# Patient Record
Sex: Female | Born: 1960 | Race: Black or African American | Hispanic: No | Marital: Single | Smoking: Former smoker
Health system: Southern US, Community
[De-identification: ages and names within clinical notes are randomized; demographics above are authoritative.]

## PROBLEM LIST (undated history)

## (undated) DIAGNOSIS — I219 Acute myocardial infarction, unspecified: Secondary | ICD-10-CM

## (undated) DIAGNOSIS — I2510 Atherosclerotic heart disease of native coronary artery without angina pectoris: Secondary | ICD-10-CM

## (undated) DIAGNOSIS — E785 Hyperlipidemia, unspecified: Secondary | ICD-10-CM

## (undated) DIAGNOSIS — I10 Essential (primary) hypertension: Secondary | ICD-10-CM

## (undated) DIAGNOSIS — M1990 Unspecified osteoarthritis, unspecified site: Secondary | ICD-10-CM

## (undated) DIAGNOSIS — I639 Cerebral infarction, unspecified: Secondary | ICD-10-CM

## (undated) DIAGNOSIS — I255 Ischemic cardiomyopathy: Secondary | ICD-10-CM

## (undated) HISTORY — DX: Unspecified osteoarthritis, unspecified site: M19.90

## (undated) HISTORY — PX: CORONARY ANGIOPLASTY: SHX604

## (undated) HISTORY — DX: Ischemic cardiomyopathy: I25.5

## (undated) HISTORY — PX: TONSILLECTOMY: SUR1361

## (undated) HISTORY — DX: Cerebral infarction, unspecified: I63.9

## (undated) HISTORY — DX: Essential (primary) hypertension: I10

## (undated) HISTORY — DX: Atherosclerotic heart disease of native coronary artery without angina pectoris: I25.10

## (undated) HISTORY — PX: CHOLECYSTECTOMY: SHX55

## (undated) HISTORY — PX: TONSILLECTOMY: SHX5217

## (undated) HISTORY — DX: Hyperlipidemia, unspecified: E78.5

---

## 1999-12-06 ENCOUNTER — Other Ambulatory Visit: Admission: RE | Admit: 1999-12-06 | Discharge: 1999-12-06 | Payer: Self-pay | Admitting: Obstetrics and Gynecology

## 2001-01-01 ENCOUNTER — Other Ambulatory Visit: Admission: RE | Admit: 2001-01-01 | Discharge: 2001-01-01 | Payer: Self-pay | Admitting: Obstetrics and Gynecology

## 2004-04-25 ENCOUNTER — Encounter: Admission: RE | Admit: 2004-04-25 | Discharge: 2004-04-25 | Payer: Self-pay | Admitting: Internal Medicine

## 2004-05-10 ENCOUNTER — Observation Stay (HOSPITAL_COMMUNITY): Admission: RE | Admit: 2004-05-10 | Discharge: 2004-05-11 | Payer: Self-pay | Admitting: General Surgery

## 2004-05-11 ENCOUNTER — Encounter (INDEPENDENT_AMBULATORY_CARE_PROVIDER_SITE_OTHER): Payer: Self-pay | Admitting: Specialist

## 2004-06-17 ENCOUNTER — Ambulatory Visit: Payer: Self-pay | Admitting: Endocrinology

## 2004-07-04 ENCOUNTER — Ambulatory Visit: Payer: Self-pay | Admitting: Endocrinology

## 2005-01-13 ENCOUNTER — Ambulatory Visit: Payer: Self-pay | Admitting: Family Medicine

## 2005-06-14 ENCOUNTER — Ambulatory Visit: Payer: Self-pay | Admitting: Family Medicine

## 2005-09-19 ENCOUNTER — Ambulatory Visit: Payer: Self-pay | Admitting: Family Medicine

## 2006-05-03 ENCOUNTER — Inpatient Hospital Stay (HOSPITAL_COMMUNITY): Admission: EM | Admit: 2006-05-03 | Discharge: 2006-05-05 | Payer: Self-pay

## 2006-05-03 ENCOUNTER — Ambulatory Visit: Payer: Self-pay | Admitting: Cardiology

## 2006-05-24 ENCOUNTER — Encounter (HOSPITAL_COMMUNITY): Admission: RE | Admit: 2006-05-24 | Discharge: 2006-08-22 | Payer: Self-pay | Admitting: Cardiology

## 2006-05-25 ENCOUNTER — Ambulatory Visit: Payer: Self-pay | Admitting: Cardiology

## 2006-06-07 ENCOUNTER — Encounter: Payer: Self-pay | Admitting: Cardiology

## 2006-06-07 ENCOUNTER — Ambulatory Visit: Payer: Self-pay

## 2006-07-13 ENCOUNTER — Ambulatory Visit: Payer: Self-pay | Admitting: Cardiology

## 2006-07-27 ENCOUNTER — Ambulatory Visit: Payer: Self-pay | Admitting: Cardiology

## 2006-08-23 ENCOUNTER — Encounter (HOSPITAL_COMMUNITY): Admission: RE | Admit: 2006-08-23 | Discharge: 2006-11-21 | Payer: Self-pay | Admitting: Cardiology

## 2006-11-30 ENCOUNTER — Ambulatory Visit: Payer: Self-pay | Admitting: Cardiology

## 2007-03-01 ENCOUNTER — Ambulatory Visit: Payer: Self-pay | Admitting: Cardiology

## 2007-04-23 DIAGNOSIS — I10 Essential (primary) hypertension: Secondary | ICD-10-CM | POA: Insufficient documentation

## 2007-04-23 DIAGNOSIS — E1169 Type 2 diabetes mellitus with other specified complication: Secondary | ICD-10-CM | POA: Insufficient documentation

## 2007-04-23 DIAGNOSIS — E119 Type 2 diabetes mellitus without complications: Secondary | ICD-10-CM | POA: Insufficient documentation

## 2007-05-09 ENCOUNTER — Ambulatory Visit: Payer: Self-pay | Admitting: Cardiology

## 2007-09-30 ENCOUNTER — Ambulatory Visit: Payer: Self-pay | Admitting: Cardiology

## 2007-11-08 ENCOUNTER — Ambulatory Visit: Admission: RE | Admit: 2007-11-08 | Discharge: 2007-11-08 | Payer: Self-pay | Admitting: Internal Medicine

## 2007-11-08 ENCOUNTER — Encounter: Payer: Self-pay | Admitting: Internal Medicine

## 2007-12-03 ENCOUNTER — Ambulatory Visit: Payer: Self-pay | Admitting: Cardiology

## 2007-12-03 LAB — CONVERTED CEMR LAB
BUN: 12 mg/dL (ref 6–23)
CO2: 25 meq/L (ref 19–32)
Calcium: 9 mg/dL (ref 8.4–10.5)
Chloride: 106 meq/L (ref 96–112)
Creatinine, Ser: 0.8 mg/dL (ref 0.4–1.2)
GFR calc Af Amer: 98 mL/min
GFR calc non Af Amer: 81 mL/min
Glucose, Bld: 186 mg/dL — ABNORMAL HIGH (ref 70–99)
Potassium: 4 meq/L (ref 3.5–5.1)
Sodium: 138 meq/L (ref 135–145)

## 2008-02-06 ENCOUNTER — Ambulatory Visit: Payer: Self-pay | Admitting: Cardiology

## 2008-02-24 ENCOUNTER — Ambulatory Visit: Payer: Self-pay | Admitting: Cardiology

## 2008-02-24 LAB — CONVERTED CEMR LAB
ALT: 12 units/L (ref 0–35)
AST: 16 units/L (ref 0–37)
Albumin: 3.2 g/dL — ABNORMAL LOW (ref 3.5–5.2)
Alkaline Phosphatase: 74 units/L (ref 39–117)
BUN: 12 mg/dL (ref 6–23)
Bilirubin, Direct: 0.1 mg/dL (ref 0.0–0.3)
CO2: 25 meq/L (ref 19–32)
Calcium: 9.1 mg/dL (ref 8.4–10.5)
Chloride: 104 meq/L (ref 96–112)
Creatinine, Ser: 0.8 mg/dL (ref 0.4–1.2)
GFR calc Af Amer: 98 mL/min
GFR calc non Af Amer: 81 mL/min
Glucose, Bld: 164 mg/dL — ABNORMAL HIGH (ref 70–99)
Hgb A1c MFr Bld: 9.4 % — ABNORMAL HIGH (ref 4.6–6.0)
Potassium: 4.2 meq/L (ref 3.5–5.1)
Sed Rate: 43 mm/hr — ABNORMAL HIGH (ref 0–22)
Sodium: 137 meq/L (ref 135–145)
Total Bilirubin: 0.5 mg/dL (ref 0.3–1.2)
Total Protein: 6.8 g/dL (ref 6.0–8.3)

## 2008-03-16 ENCOUNTER — Ambulatory Visit: Payer: Self-pay | Admitting: Cardiovascular Disease

## 2008-03-16 LAB — CONVERTED CEMR LAB
Cholesterol: 154 mg/dL
HDL: 36.3 mg/dL — ABNORMAL LOW
LDL Cholesterol: 102 mg/dL — ABNORMAL HIGH
Total CHOL/HDL Ratio: 4.2
Triglycerides: 81 mg/dL
VLDL: 16 mg/dL

## 2008-05-19 ENCOUNTER — Encounter: Admission: RE | Admit: 2008-05-19 | Discharge: 2008-05-19 | Payer: Self-pay | Admitting: Orthopedic Surgery

## 2008-07-10 ENCOUNTER — Ambulatory Visit: Payer: Self-pay | Admitting: Cardiovascular Disease

## 2008-07-10 LAB — CONVERTED CEMR LAB
ALT: 12 units/L (ref 0–35)
AST: 13 units/L (ref 0–37)
Albumin: 2.9 g/dL — ABNORMAL LOW (ref 3.5–5.2)
Alkaline Phosphatase: 77 units/L (ref 39–117)
Bilirubin, Direct: 0.1 mg/dL (ref 0.0–0.3)
Cholesterol: 114 mg/dL (ref 0–200)
HDL: 28.9 mg/dL — ABNORMAL LOW (ref >39.0)
LDL Cholesterol: 75 mg/dL (ref 0–99)
Total Bilirubin: 0.3 mg/dL (ref 0.3–1.2)
Total CHOL/HDL Ratio: 3.9
Total Protein: 7 g/dL (ref 6.0–8.3)
Triglycerides: 50 mg/dL (ref 0–149)
VLDL: 10 mg/dL (ref 0–40)

## 2008-07-13 ENCOUNTER — Ambulatory Visit: Payer: Self-pay | Admitting: Cardiovascular Disease

## 2009-01-14 ENCOUNTER — Inpatient Hospital Stay (HOSPITAL_COMMUNITY): Admission: RE | Admit: 2009-01-14 | Discharge: 2009-01-16 | Payer: Self-pay | Admitting: Internal Medicine

## 2009-01-14 ENCOUNTER — Ambulatory Visit: Payer: Self-pay | Admitting: Cardiovascular Disease

## 2009-01-14 ENCOUNTER — Encounter: Payer: Self-pay | Admitting: Cardiovascular Disease

## 2009-01-15 ENCOUNTER — Encounter: Payer: Self-pay | Admitting: Cardiovascular Disease

## 2009-01-27 ENCOUNTER — Encounter: Payer: Self-pay | Admitting: Cardiovascular Disease

## 2009-02-10 ENCOUNTER — Ambulatory Visit: Payer: Self-pay

## 2009-02-10 ENCOUNTER — Ambulatory Visit: Payer: Self-pay | Admitting: Cardiovascular Disease

## 2009-02-10 ENCOUNTER — Encounter: Payer: Self-pay | Admitting: Cardiovascular Disease

## 2009-02-10 DIAGNOSIS — I2589 Other forms of chronic ischemic heart disease: Secondary | ICD-10-CM | POA: Insufficient documentation

## 2009-02-10 DIAGNOSIS — E785 Hyperlipidemia, unspecified: Secondary | ICD-10-CM | POA: Insufficient documentation

## 2009-02-10 DIAGNOSIS — I2510 Atherosclerotic heart disease of native coronary artery without angina pectoris: Secondary | ICD-10-CM | POA: Insufficient documentation

## 2009-02-10 DIAGNOSIS — M129 Arthropathy, unspecified: Secondary | ICD-10-CM | POA: Insufficient documentation

## 2009-02-16 ENCOUNTER — Encounter (HOSPITAL_COMMUNITY): Admission: RE | Admit: 2009-02-16 | Discharge: 2009-05-06 | Payer: Self-pay | Admitting: Cardiovascular Disease

## 2009-04-19 ENCOUNTER — Encounter: Admission: RE | Admit: 2009-04-19 | Discharge: 2009-04-19 | Payer: Self-pay | Admitting: Internal Medicine

## 2009-04-20 ENCOUNTER — Encounter: Admission: RE | Admit: 2009-04-20 | Discharge: 2009-04-20 | Payer: Self-pay | Admitting: Orthopedic Surgery

## 2009-04-20 ENCOUNTER — Encounter: Payer: Self-pay | Admitting: Orthopedic Surgery

## 2009-04-21 ENCOUNTER — Telehealth: Payer: Self-pay | Admitting: Cardiovascular Disease

## 2009-04-23 ENCOUNTER — Telehealth: Payer: Self-pay | Admitting: Cardiovascular Disease

## 2009-04-26 ENCOUNTER — Ambulatory Visit: Payer: Self-pay | Admitting: Cardiovascular Disease

## 2009-04-28 ENCOUNTER — Encounter: Payer: Self-pay | Admitting: Cardiovascular Disease

## 2009-06-07 ENCOUNTER — Encounter: Payer: Self-pay | Admitting: Cardiovascular Disease

## 2009-06-07 ENCOUNTER — Encounter (INDEPENDENT_AMBULATORY_CARE_PROVIDER_SITE_OTHER): Payer: Self-pay | Admitting: *Deleted

## 2009-07-08 ENCOUNTER — Encounter: Payer: Self-pay | Admitting: Cardiovascular Disease

## 2009-07-09 ENCOUNTER — Encounter (INDEPENDENT_AMBULATORY_CARE_PROVIDER_SITE_OTHER): Payer: Self-pay | Admitting: *Deleted

## 2009-09-11 ENCOUNTER — Emergency Department (HOSPITAL_COMMUNITY): Admission: EM | Admit: 2009-09-11 | Discharge: 2009-09-11 | Payer: Self-pay | Admitting: Emergency Medicine

## 2009-09-17 ENCOUNTER — Ambulatory Visit: Payer: Self-pay | Admitting: Cardiovascular Disease

## 2010-09-08 NOTE — Letter (Signed)
Summary: Appointment - Missed  [facility], Main Office  [address]   [place], [place] [address]   Phone: [phone]  Fax: [phone]     July 09, 2009 MRN: [id number]   [name] [address] [place], [place]  [address]   Dear Ms. SHARPLESS,  Our records indicate you missed your appointment on June 08, 2009 with Dr. Clifton James. It is very important that we reach you to reschedule this appointment. We look forward to participating in your health care needs. Please contact us at the number listed above at your earliest convenience to reschedule this appointment.     Sincerely, Glass blower/designer

## 2010-09-08 NOTE — Assessment & Plan Note (Signed)
Summary: rov per pt call/lg  Medications Added * GLUCOCONTROL SUPPLEMENT 2 caps two times a day        Visit Type:  rov Primary Provider:  [name]  CC:  pt states she had palpatations the last 2 weekends in a row and went ED and had elevated bp..otherwise no other complaints today.  History of Present Illness: [name] is a pleasant 50 year old African American female with past medical history significant for hyperlipidemia, diabetes mellitus, and coronary artery disease status post placement of a Taxus drug-eluting stent in the proximal LAD in September 2007.  She was admitted to [facility] 01/14/09 with an acute anterior STEMI and was found to have very late thrombosis of the Taxus stent in the proximal LAD. Dr. Excell Seltzer treated her emergently with balloon angioplasty, no further stents were placed.  She did well following the cath and was discharged to home on 01/16/09. She unfortunately ruptured her achilles in September and there were discussions about possible repair. I saw her on April 26, 2009 and spoke to Dr. Lovie Chol. Her surgery was delayed at that time and a cast was placed. We were hoping to avoid stopping her Effient if at all possible.   She is here today for follow up.  She tells me that she has felt her heart pounding over the last two weekends. This happended after drinking wine. There was no irregularity of her heart rhytym. It was not going too fast. She was seen in the ED and told that her blood pressure was high (166/67). She denies any CP, near syncope, syncope, orthopnea, PND or lower ext edema.  She saw Dr. Lestine Box last Monday and she is going through therapy. She has some mobility of her left ankle but it is still painful. There are currently no plans for surgery.   Current Medications (verified): 1)  Diovan 80 Mg Tabs (Valsartan) .Marland Kitchen.. 1 Tab Once Daily 2)  Aspirin 81 Mg Tbec (Aspirin) .... Take One Tablet By Mouth Daily 3)  Effient 10 Mg Tabs (Prasugrel  Hcl) .... One Tablet By Mouth Once Daily. 4)  Lovaza 1 Gm Caps (Omega-3-Acid Ethyl Esters) .Marland Kitchen.. 1 Cap Once Daily 5)  Methocarbamol 500 Mg Tabs (Methocarbamol) .... As Needed 6)  Carvedilol 6.25 Mg Tabs (Carvedilol) .... One Tablet By Mouth Twice Daily 7)  Glyburide-Metformin 5-500 Mg Tabs (Glyburide-Metformin) .... 2 Tabs Two Times A Day 8)  Humalog 100 Unit/ml Soln (Insulin Lispro (Human)) .... Sliding Scale 9)  Glucocontrol Supplement .... 2 Caps Two Times A Day  Allergies: 1)  ! * Byetta  Past History:  Past Medical History: Reviewed history from 02/10/2009 and no changes required. CAD, UNSPECIFIED SITE (ICD-414.00)-native vessel. STEMI 01/14/09 with occlusion of stent in proximal LAD Ischemic cardiomyopathy HYPERLIPIDEMIA-MIXED (ICD-272.4) HYPERTENSION (ICD-401.9) DIABETES MELLITUS, TYPE II (ICD-250.00) ARTHRITIS (ICD-716.90) FAMILY HISTORY DIABETES 1ST DEGREE RELATIVE (ICD-V18.0) FAMILY HISTORY OF COLON CA 1ST DEGREE RELATIVE <60 (ICD-V16.0)    Social History: Reviewed history from 02/10/2009 and no changes required. Occupation:Teacher Single Former Smoker Alcohol use-no No illicit drug use  Review of Systems       The patient complains of palpitations.  The patient denies fatigue, malaise, fever, weight gain/loss, vision loss, decreased hearing, hoarseness, chest pain, shortness of breath, prolonged cough, wheezing, sleep apnea, coughing up blood, abdominal pain, blood in stool, nausea, vomiting, diarrhea, heartburn, incontinence, blood in urine, muscle weakness, joint pain, leg swelling, rash, skin lesions, headache, fainting, dizziness, depression, anxiety, enlarged lymph nodes, easy bruising or bleeding,  and environmental allergies.    Vital Signs:  Patient profile:   50 year old female Height:      64 inches Weight:      255 pounds BMI:     43.93 Pulse rate:   66 / minute Pulse rhythm:   irregular BP sitting:   136 / 70  (left arm) Cuff size:   large  Vitals  Entered By: Danielle Rankin, CMA (September 17, 2009 4:34 PM)  Physical Exam  General:  General: Well developed, well nourished, NAD HEENT: OP clear, mucus membranes moist Psychiatric: Mood and affect normal Neck: No JVD, no carotid bruits, no thyromegaly, no lymphadenopathy. Lungs:Clear bilaterally, no wheezes, rhonci, crackles CV: RRR no murmurs, gallops rubs Abdomen: soft, NT, ND, BS present Extremities: Trace left lower ext  edema, no right lower ext edema. Pulses 2+ bilateral DP/PT    EKG  Procedure date:  09/17/2009  Findings:      NSR, rate 66 bpm. LAD. Poor R wave progression through precordial leads.   Echocardiogram  Procedure date:  02/10/2009  Findings:      1. Left ventricle: Inferior septal and apical hypokinesis The cavity        size was mildly dilated. Systolic function was mildly reduced.        The estimated ejection fraction was in the range of 45% to 50%.     2. Left atrium: The atrium was mildly dilated.     3. Atrial septum: No defect or patent foramen ovale was identified.  Impression & Recommendations:  Problem # 1:  CAD, NATIVE VESSEL (ICD-414.01) Stable. I would like to keep her on lifetime dual antiplatelet therapy. If this needs to be stopped for a surgical procedure, we would like to have her admitted for IV heparin therapy leading up to the surgery. As outlined above,  she has had recent very late stent thrombosis (3 years out).   Her updated medication list for this problem includes:    Aspirin 81 Mg Tbec (Aspirin) .Marland Kitchen... Take one tablet by mouth daily    Effient 10 Mg Tabs (Prasugrel hcl) ..... One tablet by mouth once daily.    Carvedilol 6.25 Mg Tabs (Carvedilol) ..... One tablet by mouth twice daily  Orders: EKG w/ Interpretation (93000)  Problem # 2:  CARDIOMYOPATHY, ISCHEMIC (ICD-414.8) Mild reduction LV systolic function. Volume status ok. Continue current therapy which includes ARB and beta blockade.   Her updated medication list for  this problem includes:    Diovan 80 Mg Tabs (Valsartan) .Marland Kitchen... 1 tab once daily    Aspirin 81 Mg Tbec (Aspirin) .Marland Kitchen... Take one tablet by mouth daily    Effient 10 Mg Tabs (Prasugrel hcl) ..... One tablet by mouth once daily.    Carvedilol 6.25 Mg Tabs (Carvedilol) ..... One tablet by mouth twice daily  Problem # 3:  HYPERTENSION (ICD-401.9) BP well controlled on current therapy.   Her updated medication list for this problem includes:    Diovan 80 Mg Tabs (Valsartan) .Marland Kitchen... 1 tab once daily    Aspirin 81 Mg Tbec (Aspirin) .Marland Kitchen... Take one tablet by mouth daily    Carvedilol 6.25 Mg Tabs (Carvedilol) ..... One tablet by mouth twice daily  Problem # 4:  HYPERLIPIDEMIA-MIXED (ICD-272.4) She has been off of statins because of muscle pains while on Lipitor. I am not sure if lipids have been done recently. Will need repeat fasting lipids at follow up or at follow up with Dr. Renae Gloss if not already done in  her office. She could benefit from a low dose statin in regards to risk reduction with CAD. We could consider using low dose Crestor, which she may be able to tolerate.   The following medications were removed from the medication list:    Pravastatin Sodium 40 Mg Tabs (Pravastatin sodium) .Marland Kitchen... 1 tab at bedtime Her updated medication list for this problem includes:    Lovaza 1 Gm Caps (Omega-3-acid ethyl esters) .Marland Kitchen... 1 cap once daily  Patient Instructions: 1)  Your physician recommends that you schedule a follow-up appointment in: 6 months 2)  Your physician recommends that you continue on your current medications as directed. Please refer to the Current Medication list given to you today.

## 2010-09-08 NOTE — Letter (Signed)
Summary: [facility] Office Note  [facility] Office Note   Imported By: Roderic Ovens 07/23/2009 15:32:06  _____________________________________________________________________  External Attachment:    Type:   Image     Comment:   External Document

## 2010-09-08 NOTE — Miscellaneous (Signed)
Summary: [facility]   Imported By: Roderic Ovens 03/08/2009 13:31:27  _____________________________________________________________________  External Attachment:    Type:   Image     Comment:   External Document

## 2010-09-08 NOTE — Letter (Signed)
Summary: [facility]  [facility]   Imported By: Kassie Mends 05/26/2009 09:42:46  _____________________________________________________________________  External Attachment:    Type:   Image     Comment:   External Document

## 2010-09-08 NOTE — Assessment & Plan Note (Signed)
Summary: surg clearance  Medications Added DIOVAN 80 MG TABS (VALSARTAN) 1 tab once daily GLYBURIDE-METFORMIN 5-500 MG TABS (GLYBURIDE-METFORMIN) 2 tabs two times a day HUMALOG 100 UNIT/ML SOLN (INSULIN LISPRO (HUMAN)) sliding scale      Allergies Added: ! * BYETTA  Visit Type:  Follow-up Primary Provider:  [name]  CC:  surg clearance...denies cp...  History of Present Illness: [name] is a pleasant 50 year old African American female with past medical history significant for hyperlipidemia, diabetes mellitus, and coronary artery disease status post placement of a Taxus drug-eluting stent in the proximal LAD in September 2007.  She was admitted to [facility] 01/14/09 with an acute anterior STEMI and was found to have very late thrombosis of the Taxus stent in the proximal LAD. Dr. Excell Seltzer treated her emergently with balloon angioplasty, no further stents were placed. The cath is outlined below. She did well following the cath and was discharged to home on 01/16/09.  She has been doing well. She denies any CP, palpitation, near syncope, syncope, orthopnea, PND or lower ext edema.  She is here today for preoperative risk assessment prior to planned repair of left achilles tendon which she ruptured on September 2nd. She tripped and kicked her right foot over, which traumatized her left heel. She needs to have the achilles repaired and the question is in regards to holding her Effient/ASA. She has no complaints.   Current Medications (verified): 1)  Diovan 80 Mg Tabs (Valsartan) .Marland Kitchen.. 1 Tab Once Daily 2)  Aspirin 81 Mg Tbec (Aspirin) .... Take One Tablet By Mouth Daily 3)  Effient 10 Mg Tabs (Prasugrel Hcl) .... One Tablet By Mouth Once Daily. 4)  Pravastatin Sodium 40 Mg Tabs (Pravastatin Sodium) .Marland Kitchen.. 1 Tab At Bedtime 5)  Lovaza 1 Gm Caps (Omega-3-Acid Ethyl Esters) .Marland Kitchen.. 1 Cap Once Daily 6)  Methocarbamol 500 Mg Tabs (Methocarbamol) .... As Needed 7)  Carvedilol 6.25 Mg Tabs  (Carvedilol) .... One Tablet By Mouth Twice Daily 8)  Glyburide-Metformin 5-500 Mg Tabs (Glyburide-Metformin) .... 2 Tabs Two Times A Day 9)  Humalog 100 Unit/ml Soln (Insulin Lispro (Human)) .... Sliding Scale  Allergies (verified): 1)  ! * Byetta  Past History:  Past Medical History: Reviewed history from 02/10/2009 and no changes required. CAD, UNSPECIFIED SITE (ICD-414.00)-native vessel. STEMI 01/14/09 with occlusion of stent in proximal LAD Ischemic cardiomyopathy HYPERLIPIDEMIA-MIXED (ICD-272.4) HYPERTENSION (ICD-401.9) DIABETES MELLITUS, TYPE II (ICD-250.00) ARTHRITIS (ICD-716.90) FAMILY HISTORY DIABETES 1ST DEGREE RELATIVE (ICD-V18.0) FAMILY HISTORY OF COLON CA 1ST DEGREE RELATIVE <60 (ICD-V16.0)    Review of Systems       The patient complains of joint pain.  The patient denies fatigue, malaise, fever, weight gain/loss, vision loss, decreased hearing, hoarseness, chest pain, palpitations, shortness of breath, prolonged cough, wheezing, sleep apnea, coughing up blood, abdominal pain, blood in stool, nausea, vomiting, diarrhea, heartburn, incontinence, blood in urine, muscle weakness, leg swelling, rash, skin lesions, headache, fainting, dizziness, depression, anxiety, enlarged lymph nodes, easy bruising or bleeding, and environmental allergies.    Vital Signs:  Patient profile:   50 year old female Height:      64 inches Pulse rate:   81 / minute Pulse rhythm:   regular BP sitting:   126 / 70  (left arm) Cuff size:   large  Vitals Entered By: Danielle Rankin, CMA (April 26, 2009 10:55 AM)  Physical Exam  General:  General: Well developed, well nourished, NAD HEENT: OP clear, mucus membranes moist SKIN: warm, dry Neck: No JVD, no  carotid bruits, no thyromegaly, no lymphadenopathy. Lungs:Clear bilaterally, no wheezes, rhonci, crackles CV: RRR no murmurs, gallops rubs Abdomen: soft, NT, ND, BS present Extremities: No edema right leg. Left leg with splint/ACE bandage  in place.     EKG  Procedure date:  04/26/2009  Findings:      NSR, rate 81 bpm. Poor R wave progression through precordial leads.   Impression & Recommendations:  Problem # 1:  CAD, NATIVE VESSEL (ICD-414.01) Stable. No signs of angina, CHF or arrythmias. I would prefer that she remain on dual antiplatelet therapy for the rest of her life. She has ruptured her achilles tendon and may need to have surgery to repair this in the future. I have spoken to Dr. Lestine Box of Orthopedic Surgery. He would like to have her come by his office today to place a cast. It is most likely that she will not undergo surgical repair at this time. When surgical repair is arranged in the future, she will need to be brought in for coverage with heparin if her Effient is being held. Even though she is three years out from her stent placement, she had a stent thrombosis in June of this year and unless necessary, she will need to remain on Effient long term.   Her updated medication list for this problem includes:    Aspirin 81 Mg Tbec (Aspirin) .Marland Kitchen... Take one tablet by mouth daily    Effient 10 Mg Tabs (Prasugrel hcl) ..... One tablet by mouth once daily.    Carvedilol 6.25 Mg Tabs (Carvedilol) ..... One tablet by mouth twice daily  Orders: EKG w/ Interpretation (93000)  Patient Instructions: 1)  Your physician recommends that you continue on your current medications as directed. Please refer to the Current Medication list given to you today. 2)  Your physician recommends that you schedule a follow-up appointment in: NOVEMBER 2010

## 2010-09-08 NOTE — Progress Notes (Signed)
Summary: surgical clearance today   Phone Note From Other Clinic   Summary of Call: pt needs surgical clearance for TODAY. Does pt need post op telementry. pt on asa. pt post MI in june with stent in 07. Per [facility]. [phone]. or Dr fitzgerald [phone] have Dr fitzgerald Paged if you need to speak to him Initial call taken by: Edman Circle,  April 21, 2009 9:15 AM  Follow-up for Phone Call        Spoke with Karin Golden at [facility] outpatient surgery...advised her that with this pt's extensive cardiac history she would need preop clearance. She states that this surgery is not an emergency. She will notify the surgeon and have his office schedule her for a preopclearance appointment. Follow-up by: Suzan Garibaldi RN     Appended Document: surgical clearance today Agree. Pt with recent thrombosis of drug eluting stent in LAD with STEMI. I would prefer that we not stop her Plavix for elective surgery. Will see her in the office. cdm

## 2010-09-08 NOTE — Miscellaneous (Signed)
Summary: [facility]  [facility]   Imported By: Roderic Ovens 03/04/2009 14:40:18  _____________________________________________________________________  External Attachment:    Type:   Image     Comment:   External Document

## 2010-09-08 NOTE — Assessment & Plan Note (Signed)
Summary: PER CHECK OUT/SF  Medications Added DIOVAN HCT 80-12.5 MG TABS (VALSARTAN-HYDROCHLOROTHIAZIDE) 1 tab at bedtime JANUMET 50-1000 MG TABS (SITAGLIPTIN-METFORMIN HCL) 1 tab two times a day ASPIRIN 81 MG TBEC (ASPIRIN) Take one tablet by mouth daily EFFIENT 5 MG TABS (PRASUGREL HCL) 1 tab once daily EFFIENT 10 MG TABS (PRASUGREL HCL) One tablet by mouth once daily. BYETTA 10 MCG PEN 10 MCG/0.04ML SOLN (EXENATIDE) 1 injection two times a day VITAMIN D 1000 UNIT TABS (CHOLECALCIFEROL) 1 tab on Mon and Thur PRAVASTATIN SODIUM 40 MG TABS (PRAVASTATIN SODIUM) 1 tab at bedtime LOVAZA 1 GM CAPS (OMEGA-3-ACID ETHYL ESTERS) 1 cap once daily METHOCARBAMOL 500 MG TABS (METHOCARBAMOL) as needed CARVEDILOL 6.25 MG TABS (CARVEDILOL) One tablet by mouth twice daily      Allergies Added: NKDA  Visit Type:  Follow-up Primary Provider:  [name]  CC:  some sob when moving too fast or in the heat..denies any cp or edema.  History of Present Illness: [name] is a pleasant 50 year old African American female with past medical history significant for hyperlipidemia, diabetes mellitus, and coronary artery disease status post placement of a Taxus drug-eluting stent in the proximal LAD in September 2007.  She was admitted to [facility] 01/14/09 with an acute anterior STEMI and was found to have very late thrombosis of the Taxus stent in the proximal LAD. Dr. Excell Seltzer treated her emergently with balloon angioplasty, no further stents were placed. The cath is outlined below. She did well following the cath and was discharged to home on 01/16/09. She returns today for follow up. She has been doing well. She has not yet started cardiac rehab. She denies any CP, palpitation, near syncope, syncope, orthopnea, PND or lower ext edema. She has been mildly dyspneic in the heat but overall reports no SOB at rest. She has lost 10 pounds and is excited about this.   Current Medications (verified): 1)   Diovan Hct 80-12.5 Mg Tabs (Valsartan-Hydrochlorothiazide) .Marland Kitchen.. 1 Tab At Bedtime 2)  Janumet 50-1000 Mg Tabs (Sitagliptin-Metformin Hcl) .Marland Kitchen.. 1 Tab Two Times A Day 3)  Aspirin 81 Mg Tbec (Aspirin) .... Take One Tablet By Mouth Daily 4)  Effient 10 Mg Tabs (Prasugrel Hcl) .... One Tablet By Mouth Once Daily. 5)  Byetta 10 Mcg Pen 10 Mcg/0.58ml Soln (Exenatide) .Marland Kitchen.. 1 Injection Two Times A Day 6)  Vitamin D 1000 Unit Tabs (Cholecalciferol) .Marland Kitchen.. 1 Tab On Mon and Thur 7)  Pravastatin Sodium 40 Mg Tabs (Pravastatin Sodium) .Marland Kitchen.. 1 Tab At Bedtime 8)  Lovaza 1 Gm Caps (Omega-3-Acid Ethyl Esters) .Marland Kitchen.. 1 Cap Once Daily 9)  Methocarbamol 500 Mg Tabs (Methocarbamol) .... As Needed 10)  Carvedilol 6.25 Mg Tabs (Carvedilol) .... One Tablet By Mouth Twice Daily  Allergies (verified): No Known Drug Allergies  Past History:  Past Medical History: CAD, UNSPECIFIED SITE (ICD-414.00)-native vessel. STEMI 01/14/09 with occlusion of stent in proximal LAD Ischemic cardiomyopathy HYPERLIPIDEMIA-MIXED (ICD-272.4) HYPERTENSION (ICD-401.9) DIABETES MELLITUS, TYPE II (ICD-250.00) ARTHRITIS (ICD-716.90) FAMILY HISTORY DIABETES 1ST DEGREE RELATIVE (ICD-V18.0) FAMILY HISTORY OF COLON CA 1ST DEGREE RELATIVE <60 (ICD-V16.0)    Past Surgical History: Reviewed history from 04/23/2007 and no changes required. Cholecystectomy Tonsillectomy  Social History: Reviewed history from 04/23/2007 and no changes required. Occupation:Teacher Single Former Smoker Alcohol use-no No illicit drug use  Review of Systems  The patient denies fatigue, malaise, fever, weight gain/loss, vision loss, decreased hearing, hoarseness, chest pain, palpitations, shortness of breath, prolonged cough, wheezing, sleep apnea, coughing up blood, abdominal pain, blood  in stool, nausea, vomiting, diarrhea, heartburn, incontinence, blood in urine, muscle weakness, joint pain, leg swelling, rash, skin lesions, headache, fainting, dizziness,  depression, anxiety, enlarged lymph nodes, easy bruising or bleeding, and environmental allergies.    Vital Signs:  Patient profile:   50 year old female Height:      64 inches Weight:      251 pounds BMI:     43.24 Pulse rate:   81 / minute Pulse rhythm:   regular BP sitting:   100 / 70  (left arm) Cuff size:   large  Vitals Entered By: Danielle Rankin, CMA (February 10, 2009 4:28 PM)  Physical Exam  General:  General: Well developed, well nourished, NAD HEENT: OP clear, mucus membranes moist SKIN: warm, dry Neuro: No focal deficits Musculoskeletal: Muscle strength 5/5 all ext Psychiatric: Mood and affect normal Neck: No JVD, no carotid bruits, no thyromegaly, no lymphadenopathy. Lungs:Clear bilaterally, no wheezes, rhonci, crackles CV: RRR no murmurs, gallops rubs Abdomen: soft, NT, ND, BS present Extremities: No edema, pulses 2+.    EKG  Procedure date:  02/10/2009  Findings:      NSR, rate 81 bpm. LAFB. Poor R wave progression throughout precordial leads.   Cardiac Cath  Procedure date:  01/14/2009  Findings:      Left ventriculography:  There is anteroapical akinesis and poor visualization of the apex.  The basal segments are hyperdynamic.  The LVEF is estimated at 40%.  There is no mitral regurgitation.   Coronary angiography:  The left mainstem is patent.  There is mild 20- 30% stenosis distally in the left main.  The left main trifurcates into the circumflex intermediate branch and LAD.   LAD:  The LAD is totally occluded in the very proximal portion of the vessel.  This is just at the proximal stent edge.  There is no antegrade flow into the LAD beyond that area.  There are some right-to-left collaterals filling septal perforator branches of the LAD.   Left circumflex:  There is a large intermediate branch that bifurcates into twin vessels.  There is no significant stenosis present in that branch.  The AV groove circumflex courses down and supplies a small  OM in a moderate-sized posterolateral branch.  There is no significant stenosis in the left circumflex.   Right coronary artery:  There is a focal proximal 50% stenosis in the right coronary artery.  There is a 30% stenosis in the midportion.  The distal portion is free of significant disease and the vessel supplies a posterolateral branch and a PDA branch.  The right coronary artery is dominant. PCI of proximal LAD in stent thrombosis with PTCA only.  Echocardiogram  Procedure date:  02/10/2009  Findings:      . Left ventricle: The cavity size was mildly dilated. Systolic        function was mildly reduced. The estimated ejection fraction was        in the range of 45% to 50%. Wall motion was normal; there were no        regional wall motion abnormalities.     2. Left atrium: The atrium was mildly dilated.     3. Atrial septum: No defect or patent foramen ovale was identified.  Impression & Recommendations:  Problem # 1:  CAD, NATIVE VESSEL (ICD-414.01) S/P recent anterior STEMI secondary to very late stent thrombosis with PTCA of the stented segment and good angiographic result. Continue Effient 10 mg once daily , ASA 81  mg once daily, Coreg 6.25 mg two times a day, statin.  She will need lifelong dual antiplatelet therapy. She is to start cardiac rehab soon.        Problem # 2:  CARDIOMYOPATHY, ISCHEMIC (ICD-414.8) Mild LV dysfunction. Continue ARB, beta blocker.   Problem # 3:  HYPERTENSION (ICD-401.9) Well controlled on current medications.   Patient Instructions: 1)  Your physician wants you to follow-up in: 4 MONTHS.  You will receive a reminder letter in the mail two months in advance. If you don't receive a letter, please call our office to schedule the follow-up appointment.

## 2010-09-08 NOTE — Consult Note (Signed)
Summary: [facility]  [facility]   Imported By: Marylou Mccoy 06/16/2009 16:49:56  _____________________________________________________________________  External Attachment:    Type:   Image     Comment:   External Document

## 2010-09-08 NOTE — Miscellaneous (Signed)
Summary: chart update  Clinical Lists Changes  Echocardiogram  Procedure date:  02/10/2009  Findings:       Study Conclusions    1. Left ventricle: Inferior septal and apical hypokinesis The cavity      size was mildly dilated. Systolic function was mildly reduced.      The estimated ejection fraction was in the range of 45% to 50%.   2. Left atrium: The atrium was mildly dilated.   3. Atrial septum: No defect or patent foramen ovale was identified.   Echocardiography. M-mode, complete 2D, spectral Doppler, and color   Doppler. Height: Height: 162.6cm. Height: 64in. Weight: Weight:   111.6kg. Weight: 245.5lb. Body mass index: BMI: 42.2kg/m^2. Body   surface area: BSA: 2.47m^2. Patient status: Outpatient. Location:   Psychologist, sport and exercise.  Venous Doppler  Procedure date:  04/20/2009  Findings:        Clinical Data: Left lower extremity pain    LEFT LOWER EXTREMITY VENOUS DUPLEX ULTRASOUND    Technique: Gray-scale sonography with graded compression, as well   as color Doppler and duplex ultrasound, were performed to evaluate   the deep venous system of the left lower extremity from the level   of the common femoral vein through the popliteal and proximal calf   veins. Spectral Doppler was utilized to evaluate flow at rest and   with distal augmentation maneuvers.    Comparison: None    Findings:  Normal compressibility of  the left common femoral,   superficial femoral, and popliteal veins is demonstrated, as well   as the visualized proximal calf veins.  No filling defects to   suggest DVT on grayscale or color Doppler imaging.  Doppler   waveforms show normal direction of venous flow, normal respiratory   phasicity and response to augmentation.    IMPRESSION:   No evidence of  left lower extremity deep vein thrombosis.    Read By:  Abundio Miu,  M.D.   Released By:  Abundio Miu,  M.D.

## 2010-09-08 NOTE — Progress Notes (Signed)
Summary: SURGICAL CLEARANCE   Phone Note Call from Patient Call back at Home Phone [phone]   Caller: Patient Reason for Call: Talk to Nurse Summary of Call: HAS [facility] CONTACT YOU FOR SURGICAL CLEARANCE? Initial call taken by: Burnard Leigh,  April 23, 2009 2:18 PM  Follow-up for Phone Call        Our office has not received any notification from [facility] in regards to clearance.  Arranged surgical clearance appt for pt on Monday with Dr Clifton James. Follow-up by: Julieta Gutting, RN, BSN,  April 23, 2009 2:37 PM

## 2010-11-14 LAB — DIFFERENTIAL
Basophils Absolute: 0 10*3/uL (ref 0.0–0.1)
Basophils Relative: 0 % (ref 0–1)
Eosinophils Absolute: 0 10*3/uL (ref 0.0–0.7)
Eosinophils Relative: 0 % (ref 0–5)
Lymphocytes Relative: 15 % (ref 12–46)
Lymphs Abs: 2 10*3/uL (ref 0.7–4.0)
Monocytes Absolute: 0.6 10*3/uL (ref 0.1–1.0)
Monocytes Relative: 4 % (ref 3–12)
Neutro Abs: 10.5 10*3/uL — ABNORMAL HIGH (ref 1.7–7.7)
Neutrophils Relative %: 80 % — ABNORMAL HIGH (ref 43–77)

## 2010-11-14 LAB — COMPREHENSIVE METABOLIC PANEL
ALT: 15 U/L (ref 0–35)
AST: 73 U/L — ABNORMAL HIGH (ref 0–37)
Albumin: 2.9 g/dL — ABNORMAL LOW (ref 3.5–5.2)
Alkaline Phosphatase: 77 U/L (ref 39–117)
BUN: 9 mg/dL (ref 6–23)
CO2: 25 mEq/L (ref 19–32)
Calcium: 8.9 mg/dL (ref 8.4–10.5)
Chloride: 107 mEq/L (ref 96–112)
Creatinine, Ser: 0.7 mg/dL (ref 0.4–1.2)
GFR calc Af Amer: >60 mL/min (ref >60)
GFR calc non Af Amer: >60 mL/min (ref >60)
Glucose, Bld: 241 mg/dL — ABNORMAL HIGH (ref 70–99)
Potassium: 3.8 mEq/L (ref 3.5–5.1)
Sodium: 138 mEq/L (ref 135–145)
Total Bilirubin: 0.4 mg/dL (ref 0.3–1.2)
Total Protein: 6.8 g/dL (ref 6.0–8.3)

## 2010-11-14 LAB — POCT I-STAT, CHEM 8
BUN: 10 mg/dL (ref 6–23)
Calcium, Ion: 1.18 mmol/L (ref 1.12–1.32)
Chloride: 105 mEq/L (ref 96–112)
Creatinine, Ser: 0.6 mg/dL (ref 0.4–1.2)
Glucose, Bld: 130 mg/dL — ABNORMAL HIGH (ref 70–99)
HCT: 34 % — ABNORMAL LOW (ref 36.0–46.0)
Hemoglobin: 11.6 g/dL — ABNORMAL LOW (ref 12.0–15.0)
Potassium: 3.7 mEq/L (ref 3.5–5.1)
Sodium: 136 mEq/L (ref 135–145)
TCO2: 22 mmol/L (ref 0–100)

## 2010-11-14 LAB — HEMOGLOBIN A1C
Hgb A1c MFr Bld: 9.2 % — ABNORMAL HIGH (ref 4.6–6.1)
Mean Plasma Glucose: 217 mg/dL

## 2010-11-14 LAB — CARDIAC PANEL(CRET KIN+CKTOT+MB+TROPI)
CK, MB: 134.7 ng/mL — ABNORMAL HIGH (ref 0.3–4.0)
CK, MB: 161.6 ng/mL — ABNORMAL HIGH (ref 0.3–4.0)
CK, MB: 72.7 ng/mL — ABNORMAL HIGH (ref 0.3–4.0)
Relative Index: 10.9 — ABNORMAL HIGH (ref 0.0–2.5)
Relative Index: 7.3 — ABNORMAL HIGH (ref 0.0–2.5)
Relative Index: 8.9 — ABNORMAL HIGH (ref 0.0–2.5)
Total CK: 1477 U/L — ABNORMAL HIGH (ref 7–177)
Total CK: 1516 U/L — ABNORMAL HIGH (ref 7–177)
Total CK: 999 U/L — ABNORMAL HIGH (ref 7–177)
Troponin I: 15.64 ng/mL (ref 0.00–0.06)
Troponin I: 21.87 ng/mL (ref 0.00–0.06)
Troponin I: 34.16 ng/mL (ref 0.00–0.06)

## 2010-11-14 LAB — GLUCOSE, CAPILLARY
Glucose-Capillary: 185 mg/dL — ABNORMAL HIGH (ref 70–99)
Glucose-Capillary: 211 mg/dL — ABNORMAL HIGH (ref 70–99)
Glucose-Capillary: 292 mg/dL — ABNORMAL HIGH (ref 70–99)
Glucose-Capillary: 307 mg/dL — ABNORMAL HIGH (ref 70–99)
Glucose-Capillary: 312 mg/dL — ABNORMAL HIGH (ref 70–99)
Glucose-Capillary: 329 mg/dL — ABNORMAL HIGH (ref 70–99)
Glucose-Capillary: 347 mg/dL — ABNORMAL HIGH (ref 70–99)
Glucose-Capillary: 354 mg/dL — ABNORMAL HIGH (ref 70–99)
Glucose-Capillary: 375 mg/dL — ABNORMAL HIGH (ref 70–99)

## 2010-11-14 LAB — LIPID PANEL
Cholesterol: 136 mg/dL (ref 0–200)
HDL: 38 mg/dL — ABNORMAL LOW (ref >39)
LDL Cholesterol: 81 mg/dL (ref 0–99)
Total CHOL/HDL Ratio: 3.6 RATIO
Triglycerides: 83 mg/dL (ref <150)
VLDL: 17 mg/dL (ref 0–40)

## 2010-11-14 LAB — BASIC METABOLIC PANEL
BUN: 11 mg/dL (ref 6–23)
BUN: 8 mg/dL (ref 6–23)
CO2: 23 mEq/L (ref 19–32)
CO2: 26 mEq/L (ref 19–32)
Calcium: 8.8 mg/dL (ref 8.4–10.5)
Calcium: 9.3 mg/dL (ref 8.4–10.5)
Chloride: 102 mEq/L (ref 96–112)
Chloride: 98 mEq/L (ref 96–112)
Creatinine, Ser: 0.74 mg/dL (ref 0.4–1.2)
Creatinine, Ser: 0.93 mg/dL (ref 0.4–1.2)
GFR calc Af Amer: >60 mL/min (ref >60)
GFR calc Af Amer: >60 mL/min (ref >60)
GFR calc non Af Amer: >60 mL/min (ref >60)
GFR calc non Af Amer: >60 mL/min (ref >60)
Glucose, Bld: 316 mg/dL — ABNORMAL HIGH (ref 70–99)
Glucose, Bld: 376 mg/dL — ABNORMAL HIGH (ref 70–99)
Potassium: 3.5 mEq/L (ref 3.5–5.1)
Potassium: 3.7 mEq/L (ref 3.5–5.1)
Sodium: 133 mEq/L — ABNORMAL LOW (ref 135–145)
Sodium: 135 mEq/L (ref 135–145)

## 2010-11-14 LAB — CBC
HCT: 36 % (ref 36.0–46.0)
HCT: 37.2 % (ref 36.0–46.0)
Hemoglobin: 11.9 g/dL — ABNORMAL LOW (ref 12.0–15.0)
Hemoglobin: 12.1 g/dL (ref 12.0–15.0)
MCHC: 32.6 g/dL (ref 30.0–36.0)
MCHC: 33 g/dL (ref 30.0–36.0)
MCV: 79.7 fL (ref 78.0–100.0)
MCV: 79.8 fL (ref 78.0–100.0)
Platelets: 348 10*3/uL (ref 150–400)
Platelets: 348 10*3/uL (ref 150–400)
RBC: 4.51 MIL/uL (ref 3.87–5.11)
RBC: 4.65 MIL/uL (ref 3.87–5.11)
RDW: 15.2 % (ref 11.5–15.5)
RDW: 15.4 % (ref 11.5–15.5)
WBC: 11.9 10*3/uL — ABNORMAL HIGH (ref 4.0–10.5)
WBC: 13.1 10*3/uL — ABNORMAL HIGH (ref 4.0–10.5)

## 2010-11-14 LAB — PROTIME-INR
INR: 1 (ref 0.00–1.49)
Prothrombin Time: 13 seconds (ref 11.6–15.2)

## 2010-11-14 LAB — PLATELET COUNT: Platelets: 358 10*3/uL (ref 150–400)

## 2010-11-14 LAB — TSH: TSH: 1.088 u[IU]/mL (ref 0.350–4.500)

## 2010-11-14 LAB — CK TOTAL AND CKMB (NOT AT ARMC)
CK, MB: 20 ng/mL — ABNORMAL HIGH (ref 0.3–4.0)
Relative Index: 4 — ABNORMAL HIGH (ref 0.0–2.5)
Total CK: 499 U/L — ABNORMAL HIGH (ref 7–177)

## 2010-12-20 NOTE — Assessment & Plan Note (Signed)
[facility]                            CARDIOLOGY OFFICE NOTE   Ariel, Aguilar                       MRN:          [id number]  DATE:07/13/2008                            DOB:          20-Oct-1960    PRIMARY CARE PHYSICIAN:  Dr. Margaretmary Bayley.   REASON FOR VISIT:  Cardiac followup.   HISTORY OF PRESENT ILLNESS:  [name] is a pleasant 50 year old African  American female with past medical history significant for  hyperlipidemia, diabetes mellitus, and coronary artery disease status  post placement of a Taxus drug-eluting stent in the proximal LAD in  September 2007.  She was most recently seen in our office in August 2009  and at that time her only complaint was of some lower extremity pain,  which have been felt to be secondary to Lipitor that she had been  taking.  I felt that it was important for her to be on a statin  medication and because of this we elected to start pravastatin at 20 mg  a day.  The patient comes in today and tells me she has been tolerating  this medication well.  She denies any lower extremity pain or cramping  since starting this medication.  She also denies any chest pain,  shortness of breath, palpitations, dizziness, near syncope, syncope,  PND, orthopnea, or lower extremity edema.  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  She is interested in  losing weight and has been attempting to do so for the last few months.  She is able to exercise more now then the pain in her legs has resolved.   ALLERGIES:  No known drug allergies.   CURRENT MEDICATIONS:  1. Bisoprolol 5 mg once daily.  2. Humalog sliding scale insulin once daily.  3. Janumet 50/1000 mg twice daily.  4. Enteric-coated aspirin 81 mg once daily.  5. Plavix 75 mg once daily.  6. Altace 2.5 mg once daily.  7. Pravastatin 20 mg once daily.   PAST MEDICAL HISTORY:  As outlined above and is otherwise unchanged.   REVIEW OF  SYSTEMS:  As stated in the history of present illness and is  otherwise negative.   PHYSICAL EXAMINATION:  VITALS:  Blood pressure 118/70, pulse 88 and  regular, respirations 12 and nonlabored.  GENERAL:  She is a pleasant, overweight middle-aged [place] American  female in no acute distress.  She is alert and oriented x3.  PSYCHIATRIC:  Mood and affect are normal.  SKIN:  Warm and dry.  NECK:  No JVD.  No carotid bruits.  No thyromegaly.  No lymphadenopathy.  LUNGS:  Clear to auscultation bilaterally without wheezes, rhonchi, or  crackles noted.  CARDIOVASCULAR:  Regular rate and rhythm without murmurs, gallops, or  rubs noted.  ABDOMEN:  Soft, nontender, nondistended.  Bowel sounds are present.  EXTREMITIES:  No evidence of edema.  Pulses are 2+ in all extremities.   DIAGNOSTIC STUDIES:  Recent fasting lipid profile from July 10, 2008,  shows a total cholesterol of 114, triglycerides 50, HDL cholesterol of  29, LDL cholesterol of 75, AST 13, ALT 12, alkaline phosphatase 77.   ASSESSMENT/PLAN:  This is a pleasant 50 year old African American female  with a past medical history significant for coronary artery disease  status post prior placement of a Taxus drug-eluting stent in the  proximal left anterior descending, diabetes mellitus, and hypertension  who has had some problems with leg discomfort in the past that is felt  to be secondary to statin therapy.  She returns today for routine  cardiac followup and tells me that she has been tolerating the low-dose  of pravastatin without any leg pain.  She is asymptomatic in regards to  her coronary artery disease.  I would like to continue her aspirin, beta-  blocker, and angiotensin-converting enzyme inhibitor therapy as well as  the statin medication that was started at the last visit.  We did have  liver function tests checked several days ago, which were within normal  limits.  I think that it would be reasonable at this time to  stop her  Plavix therapy as she is 26 months out from her drug-eluting stent  placement.  I would like to see her back in this office in 6 months.  She is encouraged to continue to follow up with her primary care  physician.     Verne Carrow, MD  Electronically Signed    CM/MedQ  DD: 07/13/2008  DT: 07/14/2008  Job #: [id number]   cc:   Dr. Margaretmary Bayley

## 2010-12-20 NOTE — H&P (Signed)
NAMECIERA, BECKUM NO.:  [id number]   MEDICAL RECORD NO.:  [id number]          PATIENT TYPE:  INP   LOCATION:  2908                         FACILITY:  [facility]   PHYSICIAN:  Veverly Fells. Excell Seltzer, MD  DATE OF BIRTH:  1961/06/05   DATE OF ADMISSION:  01/14/2009  DATE OF DISCHARGE:                              HISTORY & PHYSICAL   PRIMARY CARE PHYSICIAN:  Merlene Laughter. Renae Gloss, MD   PRIMARY CARDIOLOGIST:  Verne Carrow, MD   CHIEF COMPLAINT:  Chest pain.   HISTORY OF PRESENT ILLNESS:  Ms. Rotz is a 50 year old female with a  history of coronary artery disease.  She had onset of substernal chest  pain this morning at approximately 11:30.  It reached to 10/10.  It was  associated with nausea, diaphoresis, and shortness of breath, but no  vomiting.  She was at work and had sublingual nitroglycerin with her, so  she took 2 of them, but her symptoms were not relieved.  EMS was called  and her EKG was significantly abnormal, so STEMI was called, and they  gave her 3 more nitroglycerin en route to the hospital.  Upon arrival,  she is still experiencing chest pain at 5/10.  She still feels slightly  short of breath, but is no longer nauseated.  She is not diaphoretic.  Her EKG is significantly abnormal, so she is being taken directly to the  cath lab for emergency cardiac catheterization.  Of note, she has not  had any other recent episodes of chest pain except for some longstanding  lower extremity problems.  She has otherwise been in her usual state of  health.   PAST MEDICAL HISTORY:  1. Status post cardiac catheterization in 2007, secondary to      myocardial infarction with LAD 95% reduced to 0 with a Taxus drug-      eluting stent, luminal irregularities in other vessels and an EF of      35-40%.  2. Ischemic cardiomyopathy with an EF of 35-40% at cath in 2007, EF      55% with hypokinesis of the mid distal septal wall and normal left      ventricular  thickness by echocardiogram in November 2007.  3. Diabetes.  4. Hypertension.  5. Hyperlipidemia.  6. Obesity.  7. Gastroesophageal reflux disease.  8. History of pernicious anemia.  9. Osteoarthritis.  10.History of hirsutism, possibly secondary to polycystic ovarian      disease.   SURGICAL HISTORY:  She is status post cardiac catheterization as well as  cholecystectomy and right knee surgery.   ALLERGIES:  No known drug allergies.   MEDICATION LIST:  Pending from Dr. Mathews Robinsons office.   SOCIAL HISTORY:  She lives in [place] alone and works as a Doctor, general practice.  She smoked remotely, but quit greater than 15 years ago.  She denies any history of alcohol or drug abuse.   FAMILY HISTORY:  Her mother died at age 88 with no history of coronary  artery disease, and her father died at age 23 of an external gunshot  wound.  Neither her father nor siblings have any history of coronary  artery disease.   REVIEW OF SYSTEMS:  She had some diaphoresis with chest pain.  She has  chronic dyspnea on exertion.  She has had some lower extremity edema  recently, but denies PND, although she has some orthopnea and possibly  secondary to body habitus.  She denies coughing and wheezing.  She  denies reflux symptoms or other GI symptoms.  She has chronic bilateral  lower extremity pain.  Full 14-point review of systems is otherwise  negative.   PHYSICAL EXAMINATION:  VITAL SIGNS:  She is afebrile.  Blood pressure  125/70, pulse 85, respiratory rate 23, and O2 saturation 100% on 2 L.  GENERAL:  She is a well-developed obese African American female, in  moderate distress.  HEENT:  Normal.  NECK:  There is no lymphadenopathy, thyromegaly, bruit, or JVD noted.  CV:  Heart is regular in rate and rhythm with an S1 and S2, and no  significant murmur, rub, or gallop is noted.  Distal pulses are 2+ in  all 4 extremities with no femoral bruits appreciated.  LUNGS:  Clear to auscultation  bilaterally.  SKIN:  No rashes or lesions are noted.  ABDOMEN:  Soft and nontender with active bowel sounds.  EXTREMITIES:  There is no cyanosis, clubbing, or edema noted.  MUSCULOSKELETAL:  There is no joint deformity or effusions and no spine  or CVA tenderness.  NEUROLOGIC:  She is alert and oriented.  Cranial nerves II through XII  grossly intact.   Chest x-ray is pending.   EKG from EMS shows sinus rhythm, rate 65 beats per minute with acute ST  elevation in V2, 3, and 4.   Laboratory values are pending.   IMPRESSION:  Acute anterior ST-elevation myocardial infarction:  She is  being taken directly to the cath lab.  Further evaluation and treatment  will depend on the results of the catheterization.  She will be  continued on her home medications and we will check a hemoglobin A1c and  a lipid profile.      Theodore Demark, PA-C      Veverly Fells. Excell Seltzer, MD  Electronically Signed    RB/MEDQ  D:  01/14/2009  T:  01/15/2009  Job:  [id number]   cc:   Merlene Laughter. Renae Gloss, M.D.

## 2010-12-20 NOTE — Cardiovascular Report (Signed)
[name], [name] NO.:  [id number]   MEDICAL RECORD NO.:  [id number]          PATIENT TYPE:  INP   LOCATION:  2908                         FACILITY:  [facility]   PHYSICIAN:  Veverly Fells. Excell Seltzer, MD  DATE OF BIRTH:  1960-09-29   DATE OF PROCEDURE:  DATE OF DISCHARGE:                            CARDIAC CATHETERIZATION   PROCEDURE:  1. Left heart catheterization.  2. Selective coronary angiography.  3. Left ventricular angiography.  4. Percutaneous transluminal coronary angioplasty, intravascular      ultrasound, aspiration thrombectomy of the left anterior      descending.  5. Perclose of the right femoral artery.   INDICATIONS:  [name] is a 50 year old woman with coronary artery  disease and prior stenting of the LAD with a drug-eluting stent back in  2007.  She had been in her usual state health until she developed  substernal chest pain with nausea this morning.  EMS was called and the  EKG in the field showed an anterolateral acute myocardial infarction.  A  code STEMI was activated and the patient was brought directly to the  Cardiac Cath Lab.  She has been off Plavix for approximately 1 year, but  continues with daily aspirin.   Risks and indications of the procedure were reviewed with the patient.  Informed consent was obtained.  The right groin was prepped, draped, and  anesthetized with 1% lidocaine using modified Seldinger technique.  A 6-  French sheath was placed in the right femoral artery.  A JR-4 diagnostic  catheter was used to image the right coronary artery.  An XBLAD 3.5-cm  guide catheter was inserted directly into the left coronary artery.  A  6,000 units of heparin was given.  Integrilin was used with a double  bolus and drip.  Once a therapeutic ACT was achieved, a Cougar guidewire  was passed easily beyond the area of total occlusion on the proximal  LAD.  The vessel was occluded just at the proximal edge of the stent.  The wire passed  without difficulty.  The lesion was dilated with a 2.5 x  15 mm Sprinter balloon which was taken to 10 atmospheres on single  inflation.  Reperfusion was restored after a single balloon dilatation.  There was mild residual thrombus within the stented segment and Fetch  aspiration catheter was advanced and 2 runs were performed with a Fetch  catheter.  At that point, intravascular ultrasound was performed under  automated pullback using normal technique.  This demonstrated mild stent  under expansion, but there was apposed stent throughout the segment.  I  elected to dilate the stented segment with a 2.75 x 15 mm [place] Sprinter  balloon which was taken to 16 atmospheres on the distal portion of the  stent and 18 atmospheres in the proximal portion of the stent.  The  patient tolerated the procedure well.  There were no immediate  complications.  There was 0% residual stenosis at the completion of the  procedure and TIMI III flow in the vessel.  Pigtail catheter was then  inserted and ventriculogram was  performed.  Pullback across the aortic  valve was done.  A Perclose device was used to close the femoral  arteriotomy.   FINDINGS:  LV pressure 143/21, aortic pressure 148/69 with a mean of  105.   Left ventriculography:  There is anteroapical akinesis and poor  visualization of the apex.  The basal segments are hyperdynamic.  The  LVEF is estimated at 40%.  There is no mitral regurgitation.   Coronary angiography:  The left mainstem is patent.  There is mild 20-  30% stenosis distally in the left main.  The left main trifurcates into  the circumflex intermediate branch and LAD.   LAD:  The LAD is totally occluded in the very proximal portion of the  vessel.  This is just at the proximal stent edge.  There is no antegrade  flow into the LAD beyond that area.  There are some right-to-left  collaterals filling septal perforator branches of the LAD.   Left circumflex:  There is a large  intermediate branch that bifurcates  into twin vessels.  There is no significant stenosis present in that  branch.  The AV groove circumflex courses down and supplies a small OM  in a moderate-sized posterolateral branch.  There is no significant  stenosis in the left circumflex.   Right coronary artery:  There is a focal proximal 50% stenosis in the  right coronary artery.  There is a 30% stenosis in the midportion.  The  distal portion is free of significant disease and the vessel supplies a  posterolateral branch and a PDA branch.  The right coronary artery is  dominant.   ASSESSMENT:  1. Anterior myocardial infarction secondary to very late stent      thrombosis.  2. Nonobstructive stenosis in the left circumflex and right coronary      artery.  3. Moderate left ventricular dysfunction consistent with anterior wall      myocardial infarction.  4. Successful percutaneous transluminal coronary angioplasty of the      proximal left anterior descending under the guidance of      intracoronary vascular ultrasound.   PLAN:  Recommend Integrilin for 18 hours.  The patient should remain on  dual antiplatelet therapy lifelong.  She will continue on aspirin.  She  was given 60 mg of prasugrel in the Cath Lab and should receive 10 mg  daily.  She will be transferred to the CCU for post-MI care.      Veverly Fells. Excell Seltzer, MD  Electronically Signed     MDC/MEDQ  D:  01/14/2009  T:  01/15/2009  Job:  [id number]   cc:   Verne Carrow, MD  Margaretmary Bayley, M.D.

## 2010-12-20 NOTE — Assessment & Plan Note (Signed)
[facility]                            CARDIOLOGY OFFICE NOTE   [name], [name]                       MRN:          [id number]  DATE:03/01/2007                            DOB:          Jul 22, 1961    FOLLOWUP VISIT   PRIMARY CARE PHYSICIAN:  Margaretmary Bayley, M.D.   REASON FOR VISIT:  Palpitations.   HISTORY OF PRESENT ILLNESS:  I saw [name] back in April.  She was  doing well at that time with a history of coronary artery disease as  outlined.  She is scheduled for followup visit today due to  palpitations.  She has been experiencing a feeling of intermittent  racing heartbeats that seem to occur sporadically.  She thought that,  perhaps, it was related to menopause.  She has had no frank dizziness or  syncope, and denies any associated chest pain.  She states that this may  happen as often as twice a week and typically lasts less than 10 minutes  at a time.  She has had no significant visual changes or weakness.  An  electrocardiogram today shows sinus rhythm with decreased R waves  anteriorly, as noted previously.   ALLERGIES:  No known drug allergies.   PRESENT MEDICATIONS:  1. Metformin 1000 mg p.o. b.i.d.  2. Humalog as directed.  3. Bisoprolol 2.5 mg p.o. daily.  4. Plavix 75 mg p.o. daily.  5. Januvia 100 mg p.o. daily.  6. Enteric-coated aspirin 325 mg p.o. daily.  7. Lipitor 80 mg p.o. daily.  8. Altace 2.5 mg p.o. daily.  9. Sublingual nitroglycerin 0.4 mg p.r.n.   REVIEW OF SYSTEMS:  As described in the history of present illness.  All  others negative.   EXAMINATION:  Blood pressure 118/68, heart rate is 80, weight is 256  pounds.  The patient is comfortable and in no acute distress.  Examination of the neck reveals no elevated jugular venous pressure or  loud bruits.  No thyromegaly was noted.  LUNGS:  Clear without labored breathing at rest.  CARDIAC:  Regular rate and rhythm.  No loud murmur or gallop.  EXTREMITIES:   No significant pitting edema.   IMPRESSION/RECOMMENDATIONS:  1. Palpitations as outlined.  Resting electrocardiogram shows no      significant changes.  She has had no frank syncope or chest pain.      I offered continued observation on increased dose of bisoprolol      versus proceeding on to an event recorder.  At this time, she is      most comfortable with medication adjustments, and we will plan to      increase her bisoprolol to 5 mg daily with office followup in      October as already arranged.  I have asked her to let me know if      she has progressive symptoms in the interim, as we would need to      proceed with an event recorder at that time.  She was comfortable      with this.  2. Further plan is to  follow.     Jonelle Sidle, MD  Electronically Signed    SGM/MedQ  DD: 03/01/2007  DT: 03/02/2007  Job #: [id number]   cc:   Margaretmary Bayley, M.D.

## 2010-12-20 NOTE — Assessment & Plan Note (Signed)
[facility]                            CARDIOLOGY OFFICE NOTE   [name], [name]                       MRN:          [id number]  DATE:03/16/2008                            DOB:          1960/11/05    PRIMARY CARE PHYSICIAN:  Margaretmary Bayley, M.D.   REASON FOR VISIT:  Cardiac followup.   HISTORY OF PRESENT ILLNESS:  [name] comes in today for routine  followup appointment.  She is a pleasant 50 year old [place] American  female with a past medical history significant for hyperlipidemia,  diabetes mellitus and known coronary artery disease status post  placement of a drug-eluting stent in her proximal LAD in 2007.  Her last  visit in this office was with Dr. Diona Browner on December 03, 2007.  At that  time, she had no cardiac complaints, but did complain of bilateral lower  extremity pain at rest.  It was felt that this was most likely not  arterial insufficiency.  At the beginning of July, the patient  discontinued using her Lipitor and since that time has had no further  lower extremity pain.  She denies any chest pain, shortness of breath,  palpitations, dizziness, near syncope, syncope, paroxysmal nocturnal  dyspnea, orthopnea, or lower extremity edema.  She remains active and is  working.  She states that she has cut back on her exercise regimen  secondary to the pain that she was having in her legs.   ALLERGIES:  No known drug allergies.   PRESENT MEDICATIONS:  1. Bisoprolol 5 mg once daily.  2. Humalog sliding scale insulin daily.  3. Janumet 1000 mg twice daily.  4. Enteric-coated aspirin 81 mg once daily.  5. Plavix 75 mg once daily.  6. Altace 2.5 mg once daily.   REVIEW OF SYSTEMS:  As stated in the history of present illness,  otherwise, negative.   PHYSICAL EXAMINATION:  GENERAL:  She is pleasant, middle aged [place]  American female who is in no acute distress.  VITAL SIGNS:  Blood pressure 140/70, pulse 81 and regular.   Respirations  12 and nonlabored.  Weight 257 pounds, which represents a 2-pound weight  loss since her last visit in this office in April 2009.  NECK:  No JVD.  No carotid bruits noted.  SKIN:  Warm and dry.  LUNGS:  Clear to auscultation bilaterally without rhonchi, crackles, or  wheezes.  CARDIOVASCULAR:  Regular rate and rhythm without murmurs, gallops, or  rubs noted.  ABDOMEN:  Obese, bowel sounds present, nontender.  EXTREMITIES:  No evidence of edema.  Pulses are 2+ in all extremities.   DIAGNOSTIC STUDIES:  1. Laboratory values from February 24, 2008, show a sodium of 137,      potassium 4.2, creatinine 0.8, AST 16, ALT 12, albumin 3.2, and      hemoglobin A1c 9.4.  2. Laboratory values from February 07, 2008, show a total CK of 107.  3. A 12-lead electrocardiogram obtained in our hospital today shows      normal sinus rhythm with a ventricular rate of 81 beats per minute,  normal intervals, and a septal infarction, that is unchanged from      prior EKG in April 2009.  There are nonspecific T-wave      abnormalities noted.   ASSESSMENT AND PLAN:  This is a pleasant 50 year old African American  female with a past medical history significant for coronary artery  disease status post prior placement of a drug-eluting stent in the  proximal LAD, diabetes mellitus, and hypertension, who returns today for  a routine followup visit.  In regards to her coronary artery disease,  she is asymptomatic.  Plan on continuing her on aspirin, Plavix, beta-  blocker, and ACE inhibitor therapy.  The patient did have diffuse lower  extremity muscle aches while on Lipitor.  She has since discontinued  this since her last visit with Dr. Diona Browner.  I will start Crestor 5 mg  once daily.  The patient had baseline liver function test and a CK level  that were normal 3 weeks ago.  I will perform a fasting lipid profile  today.  The patient will be seen on return in our office in 6 months.      Verne Carrow, MD  Electronically Signed    CM/MedQ  DD: 03/16/2008  DT: 03/16/2008  Job #: [id number]   cc:   Rollene Rotunda, MD, [facility]  Margaretmary Bayley, M.D.

## 2010-12-20 NOTE — Discharge Summary (Signed)
NAMELORAIN, FETTES NO.:  [id number]   MEDICAL RECORD NO.:  [id number]          PATIENT TYPE:  INP   LOCATION:  3729                         FACILITY:  [facility]   PHYSICIAN:  Verne Carrow, MDDATE OF BIRTH:  [birth year]   DATE OF ADMISSION:  01/14/2009  DATE OF DISCHARGE:                               DISCHARGE SUMMARY   PRIMARY CARE PHYSICIAN:  Dr. [name]   REASON FOR ADMISSION:  Acute anterior ST-elevation myocardial  infarction.   DISCHARGE DIAGNOSES:  1. Coronary artery disease.      a.     Status post anterior ST-elevation myocardial infarction       secondary to very late stent thrombosis treated with successful       percutaneous transluminal coronary angioplasty of the proximal       left anterior descending.      b.     Status post Taxus drug-eluting stent to the left anterior       descending in 2007 in the setting of myocardial infarction.  2. Ischemic cardiomyopathy with a previous ejection fraction of 35% to      40%.      a.     Ejection fraction improved to 55% by echocardiogram November       2007.      b.     Ejection fraction 40% by cardiac catheterization this       admission.  The patient is to have follow-up echocardiogram to       reassess left ventricular function as her left ventricle was       poorly visualized on left ventriculogram.  3. Diabetes with suboptimal control.  4. Hypertension.  5. Hyperlipidemia.  6. Gastroesophageal reflux disease.  7. History of pernicious anemia.  8. Osteoarthritis.  9. History of questionable polycystic ovarian syndrome.  10.Obesity.   PROCEDURE:  Cardiac catheterization and percutaneous coronary  intervention by Dr. Tonny Bollman January 14, 2009.  Please see his  dictated note for complete details.  Briefly, her EF was 40% left main  with 20% to 30% distal stenosis, LAD totally occluded and treated with  PTCA, RCA with a 50% proximal and 30% mid-stenosis and no significant  disease in the left circumflex.   ADMISSION HISTORY:  Ms. Renbarger is a 50 year old female who developed  substernal chest discomfort around 11:30 in the morning on the day of  admission.  EMS was summoned.  EKG was notable for anterior ST-  elevation.  She was transported emergently to the cardiac  catheterization lab for intervention.   HOSPITAL COURSE:  The patient underwent angiogram by Dr. Excell Seltzer and was  noted to have stent thrombosis in the LAD.  This was treated with  angioplasty.  She was placed on Effient as well as aspirin.  She  tolerated the procedure and had no immediate complications.  She was  recommended to remain on lifelong dual antiplatelet therapy.  It was  recommended that she undergo an echocardiogram to reassess her LV  function as her LV was poorly visualized at cardiac catheterization.  That study is still pending  at this time.  The diabetic educator was  asked to see the patient secondary to poor control of her diabetes.  She  had blood sugars remaining in the 300 range throughout her  hospitalization.  The diabetic educator did recommend initiating Lantus  10 units at bedtime.  It was also recommended that she have outpatient  education.  Lantus was added to her medical regimen at discharge as well  as an order for outpatient education.  She ambulated well with cardiac  rehab and was evaluated by Dr. Ladona Ridgel on the date of discharge.  She was  felt stable enough for discharge to home.   The patient's medications had recently been changed by her primary care  physician.  She had recently been taken off her ramipril and placed on  Diovan/hydrochlorothiazide.  She had not yet started this.  We continued  this throughout her admission, and she seemed to tolerated it.  Her  bisoprolol was also discontinued, and she was placed on carvedilol.  She  has had intolerances to statins in the past, but it was felt that she  should undergo another trial of low-dose Crestor.   Hopefully, she will  be able to tolerate this as well.   LABORATORY AND ANCILLARY DATA:  Hemoglobin 11.9, MCV 79.7, INR 1,  potassium 3.5, glucose 376, BUN 11, creatinine 0.93, hemoglobin A1c 9.2,  peak troponin 34.16, peak CK-MB 161.6, total cholesterol 136,  triglycerides 83, HDL 38, LDL 81.  TSH 1.088.  Chest x-ray:  No  significant change, no acute findings.   DISCHARGE MEDICATIONS:  1. Carvedilol 6.25 mg b.i.d.  This is new.  2. Effient 10 mg 1 tablet daily.  This is new.  3. Aspirin 325 mg daily.  4. Diovan/hydrochlorothiazide 80/12.5 mg daily.  This is new.  5. Crestor 5 mg daily.  This is new.  6. Nitroglycerin p.r.n. chest pain.  7. Lantus insulin 10 units subcutaneously at bedtime.  This is new.  8. Humalog 75/25 sliding scale insulin.  9. Janumet 500 mg b.i.d.  She is to restart this on January 17, 2009.  10.Hydrocodone/APAP unknown dosage p.r.n.  11.Flexeril 5 mg 2 tablets daily p.r.n.  12.Lovaza 1 gram daily.   ALLERGIES:  She is LACTOSE INTOLERANT.   DIET:  Low-fat, low-sodium diabetic diet.   WOUND CARE:  She is call our office for any groin swelling, bleeding,  bruising or fever.   ACTIVITY:  She is to increase activity slowly.  She may walk up steps.  She may shower.  No lifting,, driving or sexual activity for 2 weeks.   FOLLOWUP:  1. She should see Dr. Clifton James in 2 weeks.  The office will contact      her with an appointment.  2. She will be set up for an echocardiogram to reassess her LV      function.  Our office will contact her with an appointment.  3. She should see Dr. Renae Gloss within the next week to follow up on her      diabetes.  She should call for an appointment herself.   Total physician and PA time greater than 30 minutes on this discharge.      Tereso Newcomer, PA-C      Verne Carrow, MD  Electronically Signed    SW/MEDQ  D:  01/16/2009  T:  01/16/2009  Job:  [id number]   cc:   Merlene Laughter. Renae Gloss, M.D.

## 2010-12-20 NOTE — Assessment & Plan Note (Signed)
[facility]                            CARDIOLOGY OFFICE NOTE   [name], [name]                       MRN:          [id number]  DATE:05/09/2007                            DOB:          1960/09/04    PRIMARY CARE PHYSICIAN:  Dr. Margaretmary Bayley.   REASON FOR VISIT:  Followup palpitations.   HISTORY OF PRESENT ILLNESS:  Saw [name] back in July.  She was  experiencing more palpitations at that time.  We increased her  bisoprolol to 5 mg daily.  She states that she has had some fewer  palpitations but still has them although not as bothersome to her.  She  is not having any chest pain.  She has had some problems with her  shoulder recently.  I offered her an event recorder, although she is not  particularly bothered by her palpitations now and prefers observation  alone.  I have asked her to let me know if this changes.   ALLERGIES:  NO KNOWN DRUG ALLERGIES.   PRESENT MEDICATIONS:  1. Bisoprolol 5 mg p.o. daily.  2. Altace 2.5 mg p.o. daily.  3. Lipitor 80 mg p.o. daily.  4. Enteric coated aspirin 325 mg p.o. daily.  5. Januvia 100 mg p.o. daily.  6. Plavix 75 mg p.o. daily.  7. Humalog sliding scale.  8. Metformin 1000 mg p.o. b.i.d.  9. Sublingual nitroglycerin 0.4 mg p.r.n.   REVIEW OF SYSTEMS:  As described in the history of present illness.   EXAMINATION:  Blood pressure 126/72, heart rate is 64, weight is 267  pounds.  NECK:  Shows no elevated jugular venous pressure, no loud bruits.  LUNGS:  Clear without labored breathing.  CARDIAC:  Reveals a regular rate and rhythm without loud murmur, rub or  gallop.  EXTREMITIES:  Show no significant pitting edema.   IMPRESSION/RECOMMENDATIONS:  1. Palpitations, improved generally and not associated with any chest      pain or dizziness.  We will plan to continue observation and I will      see her back over the next 6 months.  2. History of coronary artery disease status post drug-eluting  stent      placement in the proximal      left anterior descending.  Patient is not having any angina and has      normal left ventricular function by followup echocardiography.     Ariel Sidle, MD  Electronically Signed    SGM/MedQ  DD: 05/09/2007  DT: 05/10/2007  Job #: [id number]   cc:   Margaretmary Bayley, M.D.

## 2010-12-20 NOTE — Assessment & Plan Note (Signed)
[facility]                            CARDIOLOGY OFFICE NOTE   Ariel, Aguilar                       MRN:          [id number]  DATE:12/03/2007                            DOB:          [birth year]    PRIMARY CARE PHYSICIAN:  Margaretmary Bayley, M.D.   REASON FOR VISIT:  Cardiac followup.   HISTORY OF PRESENT ILLNESS:  [name] comes in for a routine visit.  She  is not reporting any angina.  Electrocardiogram shows sinus rhythm with  a leftward axis, decreased septal Q waves as noted previously.  Since I  last saw her, she wore an event recorder which showed sinus rhythm with  no major dysrhythmias.  She also recently had a lower extremity venous  duplex scan in early April arranged by Dr. Chestine Spore which revealed no  problems with obvious deep venous thrombosis, superficial thrombosis or  Baker's cyst.  Her main complaint is of lower extremity pain almost  continuously with a feeling of cramps in her legs.  This does not seem  to be consistent with claudication based on her description.  With her  diabetes, we talked about the possibility that she is having neuropathic  pain, although also superficial varicosities may also be contributing.  She has good distal pulses, and I doubt significant arterial disease.  She asked about her potassium level and states that it had not been  checked recently.   ALLERGIES:  No known drug allergies.   PRESENT MEDICATIONS:  1. Bisoprolol 5 mg p.o. daily.  2. Humalog sliding scale.  3. Janumet 1000 mg p.o. b.i.d.  4. Enteric-coated aspirin 81 mg p.o. daily.  5. Altace 2.5 mg p.o. daily.  6. Lipitor 80 mg p.o. daily.  7. Plavix 75 mg p.o. daily.   REVIEW OF SYSTEMS:  As in History of Present Illness, otherwise  negative.   PHYSICAL EXAMINATION:  VITAL SIGNS:  Blood pressure 132/82, heart rate  74.  Weight is 259 pounds.  GENERAL:  This is an obese woman in no acute distress.  HEENT:  Conjunctivae and lids  normal.  Oropharynx clear.  NECK:  Supple.  No elevated jugular venous pressure.  No loud bruits.  No thyromegaly.  LUNGS:  Clear without labored breathing at rest.  CARDIAC:  Exam reveals a regular rate and rhythm.  No rub, murmur or  gallop.  ABDOMEN:  Soft, nontender.  EXTREMITIES:  Exhibit trace edema, mild superficial varicosities, spider  veins, no cords.  Distal dorsalis pedis pulses are normal.   IMPRESSION AND RECOMMENDATIONS:  1. Coronary artery disease status post drug-eluting stent placement to      the proximal left anterior descending.  The patient is doing well      symptomatically and will continue on medical therapy.  2. Lower extremity pain, possibly either neuropathic or potentially      related to peripheral varicosities.  I suppose it is also possible      she is having some mechanical lower spine neuropathic compression      with her increased weight.  This does not seem consistent with  significant peripheral arterial disease as she has normal distal      pulses and is not describing frank claudication.  She asked about      her potassium as this has apparently been low in the past and      requiring a supplement, although she was on hydrochlorothiazide at      the time.  We will plan to check a BMET to exclude this      possibility.  3. History of palpitations, not problematic at this time.  Event      recorder was unrevealing.     Jonelle Sidle, MD  Electronically Signed    SGM/MedQ  DD: 12/03/2007  DT: 12/03/2007  Job #: [id number]   cc:   Margaretmary Bayley, M.D.

## 2010-12-23 NOTE — Op Note (Signed)
NAMEVITA, CURRIN NO.:  [id number]   MEDICAL RECORD NO.:  [id number]          PATIENT TYPE:  OBV   LOCATION:  0098                         FACILITY:  [facility]   PHYSICIAN:  Lorne Skeens. Hoxworth, M.D.DATE OF BIRTH:  Jul 12, 1961   DATE OF PROCEDURE:  05/10/2004  DATE OF DISCHARGE:                                 OPERATIVE REPORT   PREOPERATIVE DIAGNOSES:  Cholelithiasis and cholecystitis.   POSTOPERATIVE DIAGNOSES:  Cholelithiasis and cholecystitis.   SURGICAL PROCEDURE:  Laparoscopic cholecystectomy with intraoperative  cholangiogram.   SURGEON:  Dr. Johna Sheriff   ASSISTANT:  Dr. Lebron Conners   ANESTHESIA:  General.   BRIEF HISTORY:  [name] is a 50 year old diabetic female with history  of repeated episodes of epigastric abdominal pain.  She has had a thorough  work-up which includes a gallbladder ultrasound showing multiple stones and  a thickened gallbladder wall.  She was felt to have symptomatic gallbladder  disease, and laparoscopic cholecystectomy with cholangiogram has been  recommended and accepted.  The nature of the procedure, indications, risks  of bleeding, infection, bile leak, and bile duct injury were discussed and  understood.  She is now brought to the operating room for this procedure.   DESCRIPTION OF OPERATION:  The patient brought to the operating room and  placed in the supine position on the operating table, and general  endotracheal anesthesia was induced.  The abdomen was widely sterilely  prepped and draped.  PAS were in place.  She received preoperative  antibiotics.  Local anesthesia was used to infiltrate the trocar sites prior  to the incisions.  A 1 cm incision was made in the midline above the  umbilicus, dissection carried down to midline fascia which was sharply  incised for 1 cm and the peritoneum entered under direct vision.  Through a  mattress suture of 0 Vicryl, the Hasson trocar was placed and  pneumoperitoneum  established.  Under direct vision, a 10 mm trocar was  placed in the subxiphoid area and two 5 mm trocars were on the right  subcostal margin.  The gallbladder was visualized and appeared opaque,  thickened, and chronically inflamed.  The fundus was grasped and elevated up  over the liver and the infundibulum retracted inferolaterally.  Peritoneum  anterior and posterior to Calot's triangle was incised.  Fibrofatty tissue  was stripped off the neck of the gallbladder toward the porta hepatis and  cystic duct identified and the cystic duct gallbladder junction dissected  360 degrees.  The cystic artery was identified in Calot's triangle.  When  the anatomy was cleared, the cystic duct was clipped at the gallbladder  junction, and operative cholangiogram was taken through the cystic duct.  This showed good filling of the normal common bile duct and intrahepatic  ducts with free flow into the duodenum and no filling defects.  Following  this, the Cholangiocath was removed, and the cystic duct was doubly clipped  proximally, clipped distally, and divided.  The cystic artery was doubly  clipped proximally, clipped distally, and divided.  The gallbladder was then  dissected free from  its bed using hook cautery and was removed intact  through the umbilicus.  Complete hemostasis was assured.  Trocars were removed and all CO2 evacuated.  The mattress suture was secured  at the Hasson trocar site.  The skin incisions were closed with interrupted  subcuticular 4-0 Monocryl and Steri-Strips.  Sponge, needle, and instrument  counts were correct.  Dry sterile dressings were applied and the patient  taken to recovery in good condition.      BTH/MEDQ  D:  05/10/2004  T:  05/10/2004  Job:  [id number]

## 2010-12-23 NOTE — Assessment & Plan Note (Signed)
[facility]                            CARDIOLOGY OFFICE NOTE   [name], [name]                       MRN:          [id number]  DATE:11/30/2006                            DOB:          08/30/1960    PRIMARY CARE PHYSICIAN:  Dr. Margaretmary Aguilar.   REASON FOR VISIT:  Follow up coronary artery disease.   HISTORY OF PRESENT ILLNESS:  [name] continues to do very well.  She  has completed cardiac rehabilitation and is exercising on her own with a  trainer.  She has lost weight since her last visit, and has been  compliant with her medications.  She has also been working on diet.  Her  electrocardiogram shows sinus rhythm with subtle Q-waves and improved T-  wave changes compared to her previous tracing.  Her medications are  outlined below.  She is not reporting any problems with angina or  limiting dyspnea on exertion.   ALLERGIES:  No known drug allergies.   PRESENT MEDICATIONS:  1. Metformin 1000 mg p.o. b.i.d.  2. Humalog as directed.  3. Bisoprolol 2.5 mg p.o. daily.  4. Plavix 75 mg p.o. daily.  5. Januvia 100 mg p.o. daily.  6. Enteric-coated aspirin 325 mg p.o. daily.  7. Lipitor 80 mg p.o. daily.  8. Altace 2.5 mg p.o. daily.  9. Sublingual nitroglycerin 0.4 mg p.r.n.   REVIEW OF SYSTEMS:  As described in the history of present illness.   PHYSICAL EXAMINATION:  Blood pressure is 109/65, weight is down from 271  to 253, heart rate is in the 70s.  The patient is comfortable and in no acute distress.  Examination of the neck reveals no elevated jugular venous pressure or  loud bruits.  Lungs are clear without labored breathing.  CARDIAC EXAM:  Reveals a regular rate and rhythm without murmur, rub, or  gallop.  EXTREMITIES:  Show no pitting edema.   IMPRESSION/RECOMMENDATION:  1. Coronary artery disease, status post drug-eluting stent placement      to the proximal left anterior descending in the setting of an acute      coronary  syndrome.  The patient has had subsequent normalization of      left ventricular function by echocardiography, and is tolerating      medical therapy well without symptoms of angina.  She will continue      diet and exercise, and      I will plan to see her back over the next 6 months for symptom      review.  2. Otherwise, continue to follow up with Dr. Chestine Spore.     Ariel Sidle, MD  Electronically Signed    SGM/MedQ  DD: 11/30/2006  DT: 11/30/2006  Job #: [id number]   cc:   [name], M.D.

## 2010-12-23 NOTE — H&P (Signed)
NAMEJUNELL, [name] NO.:  [id number]   MEDICAL RECORD NO.:  [id number]          PATIENT TYPE:  INP   LOCATION:  2807                         FACILITY:  [facility]   PHYSICIAN:  Jonelle Sidle, MD DATE OF BIRTH:  [birth year]   DATE OF ADMISSION:  05/03/2006  DATE OF DISCHARGE:                                HISTORY & PHYSICAL   PRIMARY CARE PHYSICIAN:  Tera Mater. Clent Ridges, M.D., [facility].   ENDOCRINOLOGIST:  Margaretmary Bayley, M.D.   CARDIOLOGIST:  None.   CHIEF COMPLAINT:  Chest pain.   HISTORY OF PRESENT ILLNESS:  [name] is a 50 year old African-American  woman with diabetes mellitus, pernicious anemia, hypertension, and a history  of GERD who presented to the ED with a one-week history of intermittent  exertional chest pain which increased in intensity today at around 7:00 a.m.  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  EMS  was called, and she was given 4 aspirin and nitroglycerin, after which the  pain disappeared.  The patient is currently chest pain free.  In the ED, she  was found to have elevated point-of-care cardiac markers and EKG changes (ST  wave changes in the anteroseptal leads as well as T-wave inversions in the  lateral leads).   ALLERGIES:  None.   MEDICATIONS:  1. Metformin 500 mg p.o. b.i.d.  2. Januvia 100 mg p.o. daily.  3. 70/30 insulin.  4. Humalog insulin.  5. Diovan HCT 80/12.5.  6. Nexium 40 mg p.o. p.r.n.  7. Potassium pills b.i.d. (unknown dose).  8. Ibuprofen/Advil p.r.n.   PAST MEDICAL HISTORY:  1. Diabetes.  2. Hypertension.  3. Osteoarthritis.  4. Gastroesophageal reflux disease.  5. Allergic rhinitis.  6. Pernicious anemia with baseline hemoglobin unknown.  7. 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   PAST  SURGICAL HISTORY:  1. Status post cholecystectomy in October 2005.  2. Status post right knee surgery in 2006.   SUBSTANCE USE HISTORY:  The patient has a history of tobacco use 1 pack per  week for 18 years, quit 15 years ago.  Used to drink alcohol socially but  quit after she was diagnosed with diabetes.  She has used marijuana in her  teenage years.   SOCIAL HISTORY:  The patient lives alone in [place].  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   FAMILY HISTORY:  The patient's mother died at 72 years of age.  She had  lupus.  Father accidentally shot himself in the neck when he was 52 years of  age.  She has 2 brothers and 1 sister who are healthy.  The patient is  unmarried and has no children.   REVIEW OF SYSTEMS:  CONSTITUTIONAL:  The patient had chest pain, shortness  of breath, palpitations and sweats.  ENDOCRINOLOGIC:  The patient reports  increased frequency of urination if sugars are high  and also complains of  nausea.   PHYSICAL EXAMINATION:  VITAL SIGNS:  Temperature 98.2, blood pressure  117/60, pulse 84, respiratory rate 26.  GENERAL APPEARANCE:  The patient did not appear in any acute visible  distress.  EYES:  Pupils are equal, round and reactive to light.  Extraocular movements  are intact.  OROPHARYNX:  Clear.  No erythema or exudates.  NECK:  Supple.  No bruits.  JVD 8-9 cm.  No adenopathy.  LUNGS:  Clear to auscultation bilaterally.  No wheezing or rhonchi.  HEART:  Distant heart sounds.  Regular rhythm.  No murmurs, rubs or gallops.  ABDOMEN:  Soft, nondistended and nontender.  Bowel sounds are present,  obese.  EXTREMITIES:  No edema.  Pulses 2+ bilaterally.  No rash or lesions noted.  NEUROLOGIC:  Alert and oriented x4.  Grossly nonfocal.  PSYCHIATRIC:  Appropriate.   LABORATORY TESTS:  Sodium 137, potassium 3.7, chloride 106, CO2 24, BUN 9,  creatinine 1, glucose 63.  Total bilirubin 0.4, AST 53, ALT 19, alkaline  phosphatase 64, total  protein 6.8, albumin 2.6.  Hemoglobin 9.8, hematocrit  29.8, MCV 77.2, white cell count 9.9, ANC 7.4, platelet count 444.  Point-of-  care cardiac markers:  First set showed a CK-MB of 58.8 and troponin of  12.9.  Second set:  CK-MB of more than 80 and troponin I of 17.3.  D-dimer  0.51.  EKG showed a rate of 76, regular rhythm, normal axis.  ST elevations  in the anteroseptal leads and T-wave inversions in the lateral leads.  Chest  x-ray showed bibasilar atelectasis and borderline heart size.   ASSESSMENT/PLAN:  1. Chest pain.  The patient is a menstruating woman with diabetes,      hypertension, remote history of smoking, and apparently normal lipids      presenting with chest pain.  With this presentation and her initial      point-of-care markers being elevated with ST-segment changes in the      lateral leads, she needs to be taken to cardiac catheterization      immediately.  She has been n.p.o. since this morning.  We will check      cardiac enzymes q.8 h. x3 and check an EKG after catheterization as      well.  The patient will be started on IV heparin, aspirin, beta      blockers and a statin.  Whether she should be started on      Plavix/glycoprotein IIb/IIIa inhibitors prior to catheterization will      be reviewed with the research staff.  The risks and benefits have been      explained to the patient by Dr. Diona Browner.After cath, patient will be      kept in the step-down unit for observation overnight.  2. Diabetes.  The patient currently is hypoglycemic.  She will be kept on      a D5 drip until cardiac catheterization.  One amp of D50 was given here      in the emergency department.  She will continue with sliding scale      insulin      until then.Check hemoglobing A1c.  3. History of gastroesophageal reflux disease.  Continue Nexium.  4. Pernicious anemia.  Baseline hemoglobin unknown.  Today's hemoglobin is      9.8.  Monitor H&H.      Yetta Barre, M.D.   Electronically Signed      Jonelle Sidle, MD  Electronically  Signed    SS/MEDQ  D:  05/03/2006  T:  05/03/2006  Job:  [id number]

## 2010-12-23 NOTE — Discharge Summary (Signed)
[name], [name] NO.:  [id number]   MEDICAL RECORD NO.:  [id number]          PATIENT TYPE:  INP   LOCATION:  4736                         FACILITY:  [facility]   PHYSICIAN:  Dorian Pod, ACNP  DATE OF BIRTH:  February 11, 1961   DATE OF ADMISSION:  05/03/2006  DATE OF DISCHARGE:  05/05/2006                                 DISCHARGE SUMMARY   CARDIOLOGIST:  Dr. Diona Browner.   PRIMARY CARE PHYSICIAN:  Dr. Clent Ridges at [facility]   DISCHARGE DIAGNOSIS:  1. Coronary artery disease/ACS/ST elevated myocardial infarction status      post cardiac catheterization this admission.  Placement of a Taxus drug-      eluting stent to the LAD for a 95% proximal LAD stenosis lesion.  2. Diabetes.  3. Gastroesophageal reflux disease (GERD).  4. Hypertension.  5. Pernicious anemia.   PAST MEDICAL HISTORY:  Past medical history includes:  1. Hypertension.  2. Diabetes.  3. Osteoarthritis.  4. GERD.  5. Allergic rhinitis.  6. Pernicious anemia.  7. Status post cholecystectomy.  8. Status post knee surgery.   PROCEDURES:  Procedures this admission include cardiac catheterization on  May 03, 2006.   HOSPITAL COURSE:  Ms. Tallman is a 50 year old Caucasian female with medical  history as stated above, who presented to emergency room complaining of  chest discomfort.  Initial point of care markers with a troponin of 12.  EKG  noted to have some S-T elevations, lateral leads.  Patient experienced  initial improvement with the nitroglycerin.  Troponin finally peaked at  17.3.  Dr. Diona Browner saw the patient, recommended cardiac catheterization.  Dr. Dickie La proceeded with cath/intervention.  Patient tolerated the procedure  without complications.  Low dose ACE inhibitor initiated inpatient, cardiac  rehab also initiated education.  Adjustments made in patient's medication.  We discontinued her Diovan for initiation of ACE inhibitor.  Also, started  patient on Coreg.  Dr. Graciela Husbands in to see  patient on day of discharge.  Vital  signs stable.  Patient being discharged home with instructions per post  catheterization and recommendations of cardiac rehabilitation.  She is to  followup with Dr. Diona Browner in 3-4 weeks.  She also needs a b-met drawn at  that time.  She can proceed with cardiac rehabilitation in [place] next week.   MEDICATIONS AT TIME OF DISCHARGE:  We have asked her to stop her Diovan and  potassium.  Medication prescriptions include:  1. Lisinopril 10 mg daily.  2. Coreg 6.25 mg b.i.d.  3. Lipitor 80 mg daily.  4. Plavix 75 mg daily.  5. I have instructed her to resume her Metformin tomorrow, May 06, 2006.  6. Continue insulin as previously prescribed.  7. Nitroglycerin as needed.  8. Nexium 40 mg daily as previously prescribed.  9. Aspirin 325 mg daily.   DURATION OF DISCHARGE ENCOUNTER:  30 minutes.           ______________________________  Dorian Pod, ACNP     MB/MEDQ  D:  05/05/2006  T:  05/05/2006  Job:  [id number]

## 2010-12-23 NOTE — Cardiovascular Report (Signed)
NAMEPATICIA, MOSTER                ACCOUNT NO.:  [id number]   MEDICAL RECORD NO.:  [id number]          PATIENT TYPE:  INP   LOCATION:  2807                         FACILITY:  [facility]   PHYSICIAN:  Everardo Beals. Juanda Chance, MD, FACCDATE OF BIRTH:  11/15/1960   DATE OF PROCEDURE:  DATE OF DISCHARGE:                              CARDIAC CATHETERIZATION   PRIMARY CARE PHYSICIAN:  Jeannett Senior A. Clent Ridges, M.D.   CARDIOLOGIST:  Dr. Simona Huh.   CLINICAL HISTORY:  Ms. Rogoff is a 50 year old and has insulin-dependent  diabetes and hypertension.  She has had 3 days of intermittent chest pain  and came to the emergency room with recurrent chest pain.  Her initial  troponins were positive and EKG showed poor R wave progression across the  precordial leads.  She is brought to the cath lab urgently for evaluation  with angiography.   PROCEDURE:  The procedure was performed by the right femoral artery and  arterial sheath and 6 French preformed coronary catheters.  A front wall  anteriopuncture was performed and Omnipaque contrast was used.  We performed  a relatively high stick based on the anatomy in relation to the femoral  head.  After completion of the diagnostic study, we made the decision to  proceed with intervention on the lesion in the LAD.   The patient was given antiemetics bolus and infusion.  She was given 600 mg  of Plavix and 20 mg of Pepcid.  We used a Q-3.5 6-French guiding catheter  with side holes.  We crossed the lesion with a Prowater wire without  difficulty.  We predilated with a 2.25 x 50 mm Maverick performing one  inflation to 8 atmospheres for 30 seconds.  We then deployed a 2.5 x 20 mm  Taxus stent deploying this with one inflation of 10 atmospheres for 30  seconds.  We post-dilated the stent with a 2.75  x 15-mm Quantum Maverick  performing two inflations up to 15 atmospheres for 30 seconds.  Final  diagnostic was then performed through the guiding catheter.  The patient  tolerated the procedure well and left the laboratory in satisfactory  condition.   RESULTS:  LEFT MAIN CORONARY ARTERY:  The left main coronary artery was free  of significant disease.   LEFT ANTERIOR DESCENDING ARTERY:  The left anterior descending artery gave  rise to 4 separate perforators and a diagonal branch. There was 95% stenosis  in the proximal LAD before the first separated perforator.   CIRCUMFLEX ARTERY:  The circumflex artery was a moderate size vessel that  gave rise to a large ramus branch, a small marginal branch and a  posterolateral branch. This vessel was irregular but free of significant  obstruction.   RIGHT CORONARY ARTERY:  The right coronary artery is a small to moderate  size vessel that gave rise to a conus branch, a right ventricular branch,  posterior descending branch, and a posterolateral branch. There was a  circumflex ending at the proximal portion of the right coronary artery.   LEFT VENTRICULOGRAM:  The left ventriculogram was performed in the RAO  projection which showed hypokinesis at the anterolateral wall and apex.  The  estimated ejection fraction was 35% to 40%.   Following stenting of the LAD the stenosis improved from 95% to 0%.   CONCLUSION:  1. Coronary artery disease with non-ST elevation myocardial infarction      with 95% stenosis in proximal LAD, irregularities in the circumflex      system, 30% narrowing in the proximal right coronary artery, and      anterolateral wall and apical wall hypokinesis with an estimated      fraction of 35-40%.  2. Successful PCI of the lesion in the proximal LAD using a Taxus drug-      eluting stent with improvement in center narrowing from 95% to 0%.   DISPOSITION:  The patient was returned to the [facility] unit for further  observation.  We will probably plan to keep the patient 2 days until  Saturday because of her and non-ST elevation infarction.  She needs to be on  long-term Plavix and  continued secondary risk factor modification.   ADDENDUM:  The aortic pressure was 113/72 with mean of 91.  The left  ventricular pressure is 113/25.           ______________________________  Everardo Beals. Juanda Chance, MD, [facility]     BRB/MEDQ  D:  05/03/2006  T:  05/05/2006  Job:  [id number]   cc:   Tera Mater. Clent Ridges, MD  Jonelle Sidle, MD

## 2010-12-23 NOTE — Assessment & Plan Note (Signed)
[facility]                            CARDIOLOGY OFFICE NOTE   [name], [name]                       MRN:          [id number]  DATE:07/13/2006                            DOB:          [birth year]    PRIMARY CARE PHYSICIAN:  Margaretmary Bayley, M.D.   REASON FOR VISIT:  Cardiac followup.   HISTORY OF PRESENT ILLNESS:  I saw [name] back in October.  She has a  history of an acute coronary syndrome with findings of a 95% proximal  left anterior descending stenosis, now status post drug eluting stent  placement by Dr. Juanda Chance.  Fortunately, her ejection fraction has  improved from the 35-40% range, now up to 55% by followup  echocardiography in November, on medical therapy.  She has not been on a  broad medical regimen, due to normal to relatively low resting blood  pressure, which has limited therapy.  She is tolerating low-dose  bisoprolol and tells me that she has been in cardiac rehabilitation.  She is not having any exertional angina or dyspnea.  She had blood work  obtained recently at Dr. Ophelia Charter office, per report.  She is on statin  therapy at this time.   ALLERGIES:  No known drug allergies.   PRESENT MEDICATIONS:  1. Nexium 40 mg p.r.n.  2. Metformin 1000 mg p.o. b.i.d.  3. Humalog as directed.  4. Bisoprolol 2.5 mg p.o. daily.  5. Plavix 75 mg p.o. daily.  6. Januvia 100 mg p.o. daily.  7. Enteric coated aspirin 325 mg p.o. daily.  8. Lipitor.  9. Sublingual nitroglycerin 0.4 mg p.r.n.   REVIEW OF SYSTEMS:  As described in History of Present Illness.   PHYSICAL EXAMINATION:  Blood pressure today is 132/74, heart rate is 64,  weight is 271 pounds, which is relatively stable.  The patient is  comfortable and in no acute distress.  HEENT:  Conjunctiva is normal.  Oropharynx is clear.  NECK:  The neck is supple without elevated jugular venous pressure,  without bruits.  LUNGS:  Clear, without labored breathing at rest.  CARDIAC  EXAM:  Reveals a regular rate and rhythm without rub, murmur or  gallop.  ABDOMEN:  Soft, normoactive bowel sounds.  EXTREMITIES:  Show no significant pitting edema.   IMPRESSION AND RECOMMENDATIONS:  1. Coronary artery disease, status post drug eluting stent placement      to the proximal left anterior descending in the setting of an acute      coronary syndrome.  Ejection fraction has improved to 55% by      followup echocardiography in November with hypokinesis of the mid      to distal septum and mild mitral regurgitation.  At this point,      would anticipate continuing bisoprolol and begin a trial of low-      dose Altace 2.5 mg daily with a followup BMET in two weeks.  We      will otherwise continue aspirin and Plavix and plan to see her back      over the next three months.  2.  Type 2 diabetes mellitus and lipids, followed by Dr. Chestine Spore.  Would      suggest a goal LDL cholesterol in the 70-80 range.  She is on      statin therapy at present.     Jonelle Sidle, MD  Electronically Signed    SGM/MedQ  DD: 07/13/2006  DT: 07/13/2006  Job #: [id number]   cc:   Margaretmary Bayley, M.D.

## 2010-12-23 NOTE — Assessment & Plan Note (Signed)
[facility]                              CARDIOLOGY OFFICE NOTE   [name], [name]                       MRN:          [id number]  DATE:05/25/2006                            DOB:          01-22-1961    PRIMARY CARE PHYSICIAN:  Dr. Shellia Carwin.   REASON FOR VISIT:  Followup hospitalization.   HISTORY OF PRESENT ILLNESS:  I saw [name] back in late September, when  she presented with an acute coronary syndrome.  She was taken to the cardiac  catheterization lab where she was found to have a 95% proximal left anterior  descending stenosis and underwent placement of a Taxus drug-eluting stent by  Dr. Juanda Chance.  She otherwise had mild atherosclerosis and her ejection  fraction was in the 35-40% range with anterior hypokinesis at that point.  She was initiated on medical therapy, at that point including lisinopril and  Coreg however, has been taken off both these medications citing problems  with tolerance.  She is not on bisoprolol  and tolerating this well but no  ACE inhibitor or angiotensin receptor blocker as yet.  She brings in blood  pressures at home showing systolics ranging anywhere from 95 to 121 with  heart rates generally in the 50s to 80s.  She has not yet returned to work.  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  She is  scheduled to begin cardiac rehabilitation.  Her electrocardiogram today  shows sinus rhythm with low voltage, evidence of previous anterior  distribution infarct with T-waves anteroseptally and anterolaterally.   ALLERGIES:  NO KNOWN DRUG ALLERGIES.   PRESENT MEDICATIONS:  1. Nexium 40 mg p.o. p.r.n.  2. Humalog as directed.  3. Bisoprolol 2.5 mg p.o. daily.  4. Plavix 75 mg p.o. daily.  5. Januvia 100 mg p.o. daily.  6. Enteric coated aspirin 325 mg p.o. daily.  7. Nitroglycerin 0.4 mg sublingual p.r.n.   REVIEW OF SYSTEMS:  As described in the history of present illness.  She  denies  having any recent angina or dyspnea on exertion.  She has had no  palpitations or syncope.   EXAMINATION:  VITAL SIGNS:  Blood pressure 110/68, heart rate is 70, weight  is 272 pounds.  GENERAL:  The patient is obese in no acute distress.  HEENT:  Conjunctiva and lids normal.  Oropharynx is clear.  NECK:  Supple without elevated jugular venous pressure without bruits.  No  thyromegaly is noted.  LUNGS:  Clear without labored breathing at rest.  CARDIAC EXAM:  Reveals a regular rate and rhythm without loud murmur or S3  gallop.  There is no pericardial rub.  ABDOMEN:  Soft, normal active bowel sounds.  No bruit.  EXTREMITIES:  Show no pitting edema.  Distal pulses are 2+.  SKIN:  No ulcerative changes are noted.  MUSCULOSKELETAL:  No kyphosis is noted.  NEURO/PSYCHIATRIC:  The patient is alert and oriented x3.   IMPRESSION AND RECOMMENDATIONS:  1. Coronary artery disease, status post presentation with an acute      coronary syndrome with findings  of severe proximal left anterior      descending disease, now status post placement of a drug-eluting stent.      Ejection fraction was 35-40% at that time.  I have asked her to      increase bisoprolol to 5 mg daily.  Hopefully, we can add an      angiotensin receptor blocker ultimately depending on her blood pressure      response.  I have encouraged her to continue with plans for cardiac      rehabilitation and in general have recommended a total of 4-6 weeks      from her event before she returns to work.  That places her      approximately 3 weeks from now for a return date.  I will otherwise      plan to see her back over the next few months for symptom review and      further discussion.  I would like a repeat echocardiogram to reassess      left ventricular function to determine whether any electrophysiology      evaluation may be in order.  2. Type 2 diabetes mellitus, followed by Dr. Clent Ridges.     Jonelle Sidle, MD     SGM/MedQ  DD: 05/25/2006  DT: 05/28/2006  Job #: [id number]   cc:   Tera Mater. Clent Ridges, MD

## 2010-12-25 ENCOUNTER — Other Ambulatory Visit: Payer: Self-pay | Admitting: Cardiovascular Disease

## 2011-06-02 ENCOUNTER — Other Ambulatory Visit: Payer: Self-pay | Admitting: Internal Medicine

## 2011-06-02 DIAGNOSIS — Z1231 Encounter for screening mammogram for malignant neoplasm of breast: Secondary | ICD-10-CM

## 2011-06-05 ENCOUNTER — Other Ambulatory Visit: Payer: Self-pay | Admitting: Cardiovascular Disease

## 2011-06-19 ENCOUNTER — Ambulatory Visit
Admission: RE | Admit: 2011-06-19 | Discharge: 2011-06-19 | Disposition: A | Discharge status: Home / Self Care | Payer: BC Managed Care – PPO | Source: Ambulatory Visit | Attending: Internal Medicine | Admitting: Internal Medicine

## 2011-06-19 DIAGNOSIS — Z1231 Encounter for screening mammogram for malignant neoplasm of breast: Secondary | ICD-10-CM

## 2011-06-27 ENCOUNTER — Other Ambulatory Visit: Payer: Self-pay | Admitting: Internal Medicine

## 2011-06-27 DIAGNOSIS — R928 Other abnormal and inconclusive findings on diagnostic imaging of breast: Secondary | ICD-10-CM

## 2011-07-11 ENCOUNTER — Encounter: Payer: Self-pay | Admitting: Cardiovascular Disease

## 2011-07-11 ENCOUNTER — Ambulatory Visit
Admission: RE | Admit: 2011-07-11 | Discharge: 2011-07-11 | Disposition: A | Discharge status: Home / Self Care | Payer: BC Managed Care – PPO | Source: Ambulatory Visit | Attending: Internal Medicine | Admitting: Internal Medicine

## 2011-07-11 DIAGNOSIS — R928 Other abnormal and inconclusive findings on diagnostic imaging of breast: Secondary | ICD-10-CM

## 2011-07-12 ENCOUNTER — Encounter: Payer: Self-pay | Admitting: Cardiovascular Disease

## 2011-07-12 ENCOUNTER — Ambulatory Visit (INDEPENDENT_AMBULATORY_CARE_PROVIDER_SITE_OTHER): Payer: BC Managed Care – PPO | Admitting: Cardiovascular Disease

## 2011-07-12 VITALS — BP 128/79 | HR 80 | Ht 64.0 in | Wt 237.0 lb

## 2011-07-12 DIAGNOSIS — I2510 Atherosclerotic heart disease of native coronary artery without angina pectoris: Secondary | ICD-10-CM

## 2011-07-12 DIAGNOSIS — I10 Essential (primary) hypertension: Secondary | ICD-10-CM

## 2011-07-12 DIAGNOSIS — R079 Chest pain, unspecified: Secondary | ICD-10-CM | POA: Insufficient documentation

## 2011-07-12 DIAGNOSIS — I2589 Other forms of chronic ischemic heart disease: Secondary | ICD-10-CM

## 2011-07-12 DIAGNOSIS — E785 Hyperlipidemia, unspecified: Secondary | ICD-10-CM

## 2011-07-12 DIAGNOSIS — R002 Palpitations: Secondary | ICD-10-CM

## 2011-07-12 NOTE — Patient Instructions (Signed)
Your physician recommends that you schedule a follow-up appointment in: 2-3 weeks with Tereso Newcomer, PA  Your physician has requested that you have a lexiscan myoview. For further information please visit [email or web address] Please follow instruction sheet, as given.   Your physician has recommended that you wear a holter monitor. Holter monitors are medical devices that record the heart's electrical activity. Doctors most often use these monitors to diagnose arrhythmias. Arrhythmias are problems with the speed or rhythm of the heartbeat. The monitor is a small, portable device. You can wear one while you do your normal daily activities. This is usually used to diagnose what is causing palpitations/syncope (passing out).

## 2011-07-12 NOTE — Progress Notes (Signed)
History of Present Illness: [name] is a pleasant 50 year old African American female with past medical history significant for hyperlipidemia, diabetes mellitus, and coronary artery disease status post placement of a Taxus drug-eluting stent in the proximal LAD in September 2007.  She was admitted to [facility] 01/14/09 with an acute anterior STEMI and was found to have very late thrombosis of the Taxus stent in the proximal LAD. Dr. Excell Seltzer treated her emergently with balloon angioplasty, no further stents were placed.  She did well following the cath and was discharged to home on 01/16/09. LVEF was 45-50% by echo in July 2010.  I saw her last one year ago.   She is here today for follow up.  She describes some substernal burning. This is mostly at rest. It does feel like her prior angina.  No associated SOB, diaphoresis, near syncope or syncope. She also describes palpitations that occur every other day. Feels like heart skipping.   Past Medical History  Diagnosis Date  . Coronary atherosclerosis of unspecified type of vessel, native or graft   . Hyperlipidemia   . Hypertension   . Diabetes mellitus   . Arthritis     Past Surgical History  Procedure Date  . Cholecystectomy   . Tonsillectomy     Current Outpatient Prescriptions  Medication Sig Dispense Refill  . aspirin 81 MG tablet Take 81 mg by mouth daily.        . carvedilol (COREG) 6.25 MG tablet TAKE 1 TABLET BY MOUTH TWICE A DAY  60 tablet  4  . glyBURIDE-metformin (GLUCOVANCE) 5-500 MG per tablet Take 2 tablets by mouth daily with breakfast.        . insulin lispro (HUMALOG) 100 UNIT/ML injection Inject into the skin 3 (three) times daily before meals.        . methocarbamol (ROBAXIN) 500 MG tablet Take 500 mg by mouth 4 (four) times daily. As needed       . omega-3 acid ethyl esters (LOVAZA) 1 G capsule Take 1 g by mouth daily.        . prasugrel (EFFIENT) 10 MG TABS Take by mouth daily.        . valsartan (DIOVAN)  80 MG tablet Take 80 mg by mouth daily.          No Known Allergies  History   Social History  . Marital Status: Single    Spouse Name: N/A    Number of Children: N/A  . Years of Education: N/A   Occupational History  . Not on file.   Social History Main Topics  . Smoking status: Not on file  . Smokeless tobacco: Not on file  . Alcohol Use:   . Drug Use:   . Sexually Active:    Other Topics Concern  . Not on file   Social History Narrative  . No narrative on file    Family History  Problem Relation Age of Onset  . Arthritis Other   . Colon cancer Other   . Diabetes Other     Review of Systems:  As stated in the HPI and otherwise negative.   BP 128/79  Pulse 80  Ht 5\' 4"  (1.626 m)  Wt 237 lb (107.502 kg)  BMI 40.68 kg/m2  Physical Examination: General: Well developed, well nourished, NAD HEENT: OP clear, mucus membranes moist SKIN: warm, dry. No rashes. Neuro: No focal deficits Musculoskeletal: Muscle strength 5/5 all ext Psychiatric: Mood and affect normal Neck: No JVD,  no carotid bruits, no thyromegaly, no lymphadenopathy. Lungs:Clear bilaterally, no wheezes, rhonci, crackles Cardiovascular: Regular rate and rhythm. No murmurs, gallops or rubs. Abdomen:Soft. Bowel sounds present. Non-tender.  Extremities: No lower extremity edema. Pulses are 2 + in the bilateral DP/PT.  EKG: NSR, rate 80 bpm. LAD, Prior septal infarction.

## 2011-07-12 NOTE — Assessment & Plan Note (Addendum)
Recent episodes of chest pain. Feels like her prior angina. She has had this mostly at rest. She has known CAD. Will arrange Lexiscan myoview to exclude ischemia.

## 2011-07-12 NOTE — Assessment & Plan Note (Signed)
Will arrange 48 hour  Monitor.  

## 2011-07-12 NOTE — Assessment & Plan Note (Signed)
Volume status ok. She is on good medical therapy.

## 2011-07-12 NOTE — Assessment & Plan Note (Signed)
She does not tolerate statins. Lipids followed in primary care.

## 2011-07-12 NOTE — Assessment & Plan Note (Signed)
See above

## 2011-07-12 NOTE — Assessment & Plan Note (Signed)
BP well controlled.

## 2011-07-19 ENCOUNTER — Encounter (INDEPENDENT_AMBULATORY_CARE_PROVIDER_SITE_OTHER): Payer: BC Managed Care – PPO

## 2011-07-19 ENCOUNTER — Ambulatory Visit (HOSPITAL_COMMUNITY): Payer: BC Managed Care – PPO | Attending: Internal Medicine | Admitting: Radiology

## 2011-07-19 VITALS — BP 138/86 | Ht 64.0 in | Wt 242.0 lb

## 2011-07-19 DIAGNOSIS — R002 Palpitations: Secondary | ICD-10-CM

## 2011-07-19 DIAGNOSIS — R000 Tachycardia, unspecified: Secondary | ICD-10-CM | POA: Insufficient documentation

## 2011-07-19 DIAGNOSIS — E119 Type 2 diabetes mellitus without complications: Secondary | ICD-10-CM

## 2011-07-19 DIAGNOSIS — E785 Hyperlipidemia, unspecified: Secondary | ICD-10-CM | POA: Insufficient documentation

## 2011-07-19 DIAGNOSIS — R079 Chest pain, unspecified: Secondary | ICD-10-CM | POA: Insufficient documentation

## 2011-07-19 DIAGNOSIS — I10 Essential (primary) hypertension: Secondary | ICD-10-CM | POA: Insufficient documentation

## 2011-07-19 DIAGNOSIS — I2510 Atherosclerotic heart disease of native coronary artery without angina pectoris: Secondary | ICD-10-CM | POA: Insufficient documentation

## 2011-07-19 DIAGNOSIS — E669 Obesity, unspecified: Secondary | ICD-10-CM | POA: Insufficient documentation

## 2011-07-19 DIAGNOSIS — Z87891 Personal history of nicotine dependence: Secondary | ICD-10-CM | POA: Insufficient documentation

## 2011-07-19 MED ORDER — REGADENOSON 0.4 MG/5ML IV SOLN
0.4000 mg | Freq: Once | INTRAVENOUS | Status: AC
  Administered 2011-07-19: 0.4 mg via INTRAVENOUS

## 2011-07-19 MED ORDER — TECHNETIUM TC 99M TETROFOSMIN IV KIT
30.0000 | PACK | Freq: Once | INTRAVENOUS | Status: AC | PRN
  Administered 2011-07-19: 30 via INTRAVENOUS

## 2011-07-19 NOTE — Progress Notes (Signed)
[facility] SITE 3 NUCLEAR MED [address] [place] [address] [phone]  Cardiology Nuclear Med Study  [name] is a 51 y.o. female [id number] 1960/11/24   Nuclear Med Background Indication for Stress Test:  Evaluation for Ischemia and PTCA/Stent Patency  History:  '07 Stent-LAD; '10 MI>PTCA-Thrombosed LAD Stent; '10 EF=45-50% Cardiac Risk Factors: History of Smoking, Hypertension, IDDM Type 2, Lipids and Obesity  Symptoms:  Chest Pain (last episode of chest discomfort is now, 5/10) and Rapid HR   Nuclear Pre-Procedure Caffeine/Decaff Intake:  None NPO After: 9:00pm   Lungs:  Clear.  O2 SAT 98% on RA IV 0.9% NS with Angio Cath:  22g  IV Site: R Hand  IV Started by:  Bonnita Levan, RN  Chest Size (in):  44 Cup Size: C  Height: 5\' 4"  (1.626 m)  Weight:  242 lb (109.77 kg)  BMI:  Body mass index is 41.54 kg/(m^2). Tech Comments:  Held Coreg this AM,BS @ 1230 pm=142    Nuclear Med Study 1 or 2 day study: 2 day  Stress Test Type:  Lexiscan  Reading MD: Lorre Nick. Order Authorizing Provider:  Verne Carrow, MD  Resting Radionuclide: Technetium 4m Tetrofosmin  Resting Radionuclide Dose: 33.0 mCi 07/20/11  Stress Radionuclide:  Technetium 42m Tetrofosmin  Stress Radionuclide Dose: 33.0 mCi 07/19/11          Stress Protocol Rest HR: 79 Stress HR: 114  Rest BP: 138/86 Stress BP: 162/82  Exercise Time (min): n/a METS: n/a   Predicted Max HR: 168 bpm % Max HR: 67.86 bpm Rate Pressure Product: 21308   Dose of Adenosine (mg):  n/a Dose of Lexiscan: 0.4 mg  Dose of Atropine (mg): n/a Dose of Dobutamine: n/a mcg/kg/min (at max HR)  Stress Test Technologist: Smiley Houseman, CMA-N  Nuclear Technologist:  Domenic Polite, CNMT     Rest Procedure:  Myocardial perfusion imaging was performed at rest 45 minutes following the intravenous administration of Technetium 40m Tetrofosmin.  Rest ECG: Prior [facility]  Stress Procedure:  The patient  received IV Lexiscan 0.4 mg over 15-seconds.  Technetium 55m Tetrofosmin injected at 30-seconds.  There were no significant changes with Lexiscan.  Quantitative spect images were obtained after a 45 minute delay.  Stress ECG: No significant change from baseline ECG  QPS Raw Data Images:  Normal; no motion artifact; normal heart/lung ratio. Stress Images:  There is decreased uptake in the anterior wall. Rest Images:  There is decreased uptake in the anterior wall. Subtraction (SDS):  There is a fixed defect that is most consistent with a previous infarction. Transient Ischemic Dilatation (Normal <1.22):  1.08 Lung/Heart Ratio (Normal <0.45):  0.16  Quantitative Gated Spect Images QGS EDV:  139 ml QGS ESV:  77 ml QGS cine images:  Anteroapical hypokinesis EF 45% QGS EF: 45%  Impression Exercise Capacity:  Lexiscan with no exercise. BP Response:  Normal blood pressure response. Clinical Symptoms:  Atypical chest pain. ECG Impression:  No significant ST segment change suggestive of ischemia. Comparison with Prior Nuclear Study: No previous nuclear study performed  Overall Impression:  Low risk stress nuclear study. Moderate anterior apical and inferoapical wall infarct with no ischemia EF 45%  Charlton Haws

## 2011-07-20 ENCOUNTER — Ambulatory Visit (HOSPITAL_COMMUNITY): Payer: BC Managed Care – PPO | Attending: Cardiovascular Disease | Admitting: Radiology

## 2011-07-20 DIAGNOSIS — R0989 Other specified symptoms and signs involving the circulatory and respiratory systems: Secondary | ICD-10-CM

## 2011-07-20 MED ORDER — TECHNETIUM TC 99M TETROFOSMIN IV KIT
30.0000 | PACK | Freq: Once | INTRAVENOUS | Status: AC | PRN
  Administered 2011-07-20: 30 via INTRAVENOUS

## 2011-08-04 ENCOUNTER — Ambulatory Visit: Payer: BC Managed Care – PPO | Admitting: Physician Assistant

## 2011-08-17 ENCOUNTER — Ambulatory Visit: Payer: BC Managed Care – PPO | Admitting: Physician Assistant

## 2011-08-29 ENCOUNTER — Ambulatory Visit (INDEPENDENT_AMBULATORY_CARE_PROVIDER_SITE_OTHER): Payer: BC Managed Care – PPO | Admitting: Physician Assistant

## 2011-08-29 ENCOUNTER — Encounter: Payer: Self-pay | Admitting: Physician Assistant

## 2011-08-29 VITALS — BP 140/78 | Resp 18 | Ht 64.0 in | Wt 239.1 lb

## 2011-08-29 DIAGNOSIS — I10 Essential (primary) hypertension: Secondary | ICD-10-CM

## 2011-08-29 DIAGNOSIS — I2589 Other forms of chronic ischemic heart disease: Secondary | ICD-10-CM

## 2011-08-29 DIAGNOSIS — M129 Arthropathy, unspecified: Secondary | ICD-10-CM

## 2011-08-29 DIAGNOSIS — R002 Palpitations: Secondary | ICD-10-CM

## 2011-08-29 DIAGNOSIS — I2510 Atherosclerotic heart disease of native coronary artery without angina pectoris: Secondary | ICD-10-CM

## 2011-08-29 NOTE — Progress Notes (Signed)
[address] [place], [place]  [address] Phone: [phone] Fax:  [phone]  Date:  08/29/2011   Name:  [name]       DOB:  07/08/1961 MRN:  [id number]  PCP:  Dr. Renae Gloss Primary Cardiologist:  Dr. Verne Carrow  Primary Electrophysiologist:  None    History of Present Illness: [name] is a 51 y.o. female who presents for follow up.  She has a h/o hyperlipidemia, diabetes mellitus, and coronary artery disease status post placement of a Taxus drug-eluting stent in the proximal LAD in September 2007. She was admitted to [facility] 01/14/09 with an acute anterior STEMI and was found to have very late thrombosis of the Taxus stent in the proximal LAD. Dr. Excell Seltzer treated her emergently with balloon angioplasty, no further stents were placed.  LHC 6/10: ant apical AK, Ef 40%, dLM 20-30%, pLAD occluded at prox edge of stent (treated with PCI), R-L collats, pRCA 50%, mRCA 30%.  Last echo 7/10: inf septal and apical HK, EF 45-50%, mild LAE.    She was seen in follow up 07/12/11 with chest pain and palpitations.  Myoview done 07/19/11: mod ant apical and inf apical scar, no ischemia, EF 45%.  This was felt to be stable and med Rx was continued.  She was also set up for a 48 hr Holter.  Monitor was completed but the results cannot be located.    The patient denies chest pain, shortness of breath, syncope, palpitations, orthopnea, PND or significant pedal edema.   Past Medical History  Diagnosis Date  . CAD (coronary artery disease)     s/p Taxus DES to pLAD 9/07; ant STEMI 6/10 due to late stent thrombosis treated with thrombectomy and POBA to LAD (LHC: ant apical AK, Ef 40%, dLM 20-30%, pLAD occluded at prox edge of stent (treated with PCI), R-L collats, pRCA 50%, mRCA 30%.) ;Myoview done 07/19/11: mod ant apical and inf apical scar, no ischemia, EF 45%    . Hyperlipidemia   . Hypertension   . Diabetes mellitus   . Arthritis   . Ischemic  cardiomyopathy     echo 7/10: inf septal and apical HK, EF 45-50%, mild LAE.      Current Outpatient Prescriptions  Medication Sig Dispense Refill  . aspirin 81 MG tablet Take 81 mg by mouth daily.        . B-D ULTRAFINE III SHORT PEN 31G X 8 MM MISC       . carvedilol (COREG) 6.25 MG tablet TAKE 1 TABLET BY MOUTH TWICE A DAY  60 tablet  4  . cyclobenzaprine (FLEXERIL) 10 MG tablet Take 10 mg by mouth 3 (three) times daily as needed.        . glyBURIDE-metformin (GLUCOVANCE) 5-500 MG per tablet Take 2 tablets by mouth daily with breakfast.        . HUMALOG MIX 75/25 KWIKPEN (75-25) 100 UNIT/ML SUSP Inject 20 Units into the skin as directed.       Marland Kitchen HYDROcodone-acetaminophen (NORCO) 5-325 MG per tablet       . insulin lispro (HUMALOG) 100 UNIT/ML injection Inject into the skin 3 (three) times daily before meals. Sliding scale.      . omega-3 acid ethyl esters (LOVAZA) 1 G capsule Take 1 g by mouth daily.        . ONE TOUCH ULTRA TEST test strip       . prasugrel (EFFIENT) 10 MG TABS Take by  mouth daily.        . valsartan (DIOVAN) 80 MG tablet Take 80 mg by mouth daily.        Marland Kitchen VICTOZA 18 MG/3ML SOLN       . VOLTAREN 1 % GEL         Allergies: No Known Allergies  History  Substance Use Topics  . Smoking status: Former Games developer  . Smokeless tobacco: Not on file   Comment: QUIT 21 YEARS AGO  . Alcohol Use: Not on file     PHYSICAL EXAM: VS:  BP 140/78  Resp 18  Ht 5\' 4"  (1.626 m)  Wt 239 lb 1.9 oz (108.464 kg)  BMI 41.04 kg/m2 Well nourished, well developed, in no acute distress HEENT: normal Neck: no JVD Cardiac:  normal S1, S2; RRR; no murmur Lungs:  clear to auscultation bilaterally, no wheezing, rhonchi or rales Abd: soft, nontender, no hepatomegaly Ext: no edema Skin: warm and dry Neuro:  CNs 2-12 intact, no focal abnormalities noted  EKG:   Sinus rhythm, heart rate 82, left axis deviation, poor R wave progression, nonspecific ST-T wave changes  ASSESSMENT AND  PLAN:

## 2011-08-29 NOTE — Patient Instructions (Signed)
Your physician wants you to follow-up in: 6 MONTHS WITH DR. Clifton James. You will receive a reminder letter in the mail two months in advance. If you don't receive a letter, please call our office to schedule the follow-up appointment.  NO CHANGES TODAY

## 2011-08-29 NOTE — Assessment & Plan Note (Signed)
She may need right TKR.  She would be at risk for stent thrombosis again and needs to remain on DAPT.  If she needs surgery, she would likely need to be admitted for 2b/3a inhibitor administration.  I have suggested she see Dr. Verne Carrow prior to any planned surgeries to have this assessed.

## 2011-08-29 NOTE — Assessment & Plan Note (Signed)
Recent stress test low risk.  Continue aspirin and Effient.  Follow up with Dr. Verne Carrow in 6 mos.

## 2011-08-29 NOTE — Assessment & Plan Note (Signed)
Usually well controlled.  Continue current Rx.

## 2011-08-29 NOTE — Assessment & Plan Note (Signed)
No recurrence.  Discussed with Dr. Verne Carrow.  He does not remember any AFib or VTach on her monitor.  We will continue to try to locate it.

## 2011-08-29 NOTE — Assessment & Plan Note (Signed)
EF stable.  Continue current dose of beta blocker and ARB.

## 2011-08-31 ENCOUNTER — Telehealth: Payer: Self-pay | Admitting: *Deleted

## 2011-08-31 NOTE — Telephone Encounter (Signed)
Left message on pt's identified voicemail of monitor results. Left message to call back if any questions.

## 2011-10-31 ENCOUNTER — Other Ambulatory Visit: Payer: Self-pay | Admitting: Cardiovascular Disease

## 2011-11-16 ENCOUNTER — Encounter: Payer: Self-pay | Admitting: Cardiovascular Disease

## 2011-11-16 ENCOUNTER — Ambulatory Visit (INDEPENDENT_AMBULATORY_CARE_PROVIDER_SITE_OTHER): Payer: BC Managed Care – PPO | Admitting: Cardiovascular Disease

## 2011-11-16 VITALS — BP 126/73 | HR 73 | Ht 64.0 in | Wt 238.0 lb

## 2011-11-16 DIAGNOSIS — I2510 Atherosclerotic heart disease of native coronary artery without angina pectoris: Secondary | ICD-10-CM

## 2011-11-16 NOTE — Progress Notes (Signed)
History of Present Illness: [name] is a 51 y.o. female who presents for follow up. She has a h/o hyperlipidemia, diabetes mellitus, and coronary artery disease status post placement of a Taxus drug-eluting stent in the proximal LAD in September 2007. She was admitted to [facility] 01/14/09 with an acute anterior STEMI and was found to have very late thrombosis of the Taxus stent in the proximal LAD. Dr. Excell Seltzer treated her emergently with balloon angioplasty, no further stents were placed. LHC 6/10: ant apical AK, Ef 40%, dLM 20-30%, pLAD occluded at prox edge of stent (treated with PCI), R-L collats, pRCA 50%, mRCA 30%. Last echo 7/10: inf septal and apical HK, EF 45-50%, mild LAE. She was seen in follow up 07/12/11 with chest pain and palpitations. Myoview done 07/19/11: mod ant apical and inf apical scar, no ischemia, EF 45%. This was felt to be stable and med Rx was continued.  She is here today for folllow up. The patient denies chest pain, shortness of breath, syncope, palpitations, orthopnea, PND or significant pedal edema. She had A viral infection last week with diarrhea. She has plans for a right total knee replacement this summer per Dr. Charlann Boxer.    Primary Care Physician: Dr. [name]  Last Lipid Profile: In primary care. Pt reports this was well controlled.   Past Medical History  Diagnosis Date  . CAD (coronary artery disease)     s/p Taxus DES to pLAD 9/07; ant STEMI 6/10 due to late stent thrombosis treated with thrombectomy and POBA to LAD (LHC: ant apical AK, Ef 40%, dLM 20-30%, pLAD occluded at prox edge of stent (treated with PCI), R-L collats, pRCA 50%, mRCA 30%.) ;Myoview done 07/19/11: mod ant apical and inf apical scar, no ischemia, EF 45%    . Hyperlipidemia   . Hypertension   . Diabetes mellitus   . Arthritis   . Ischemic cardiomyopathy     echo 7/10: inf septal and apical HK, EF 45-50%, mild LAE.      Past Surgical History  Procedure Date  .  Cholecystectomy   . Tonsillectomy     Current Outpatient Prescriptions  Medication Sig Dispense Refill  . aspirin 81 MG tablet Take 81 mg by mouth daily.        . B-D ULTRAFINE III SHORT PEN 31G X 8 MM MISC       . carvedilol (COREG) 6.25 MG tablet TAKE 1 TABLET BY MOUTH TWICE A DAY  60 tablet  4  . cyclobenzaprine (FLEXERIL) 10 MG tablet Take 10 mg by mouth 3 (three) times daily as needed.        . glyBURIDE-metformin (GLUCOVANCE) 5-500 MG per tablet Take 2 tablets by mouth 2 (two) times daily with a meal.       . HUMALOG MIX 75/25 KWIKPEN (75-25) 100 UNIT/ML SUSP Sliding scale      . HYDROcodone-acetaminophen (NORCO) 5-325 MG per tablet       . omega-3 acid ethyl esters (LOVAZA) 1 G capsule Take 1 g by mouth 2 (two) times daily.       . ONE TOUCH ULTRA TEST test strip       . prasugrel (EFFIENT) 10 MG TABS Take by mouth daily.        . promethazine (PHENERGAN) 25 MG tablet As needed      . traMADol (ULTRAM) 50 MG tablet As needed      . valsartan (DIOVAN) 80 MG tablet Take 80 mg by mouth  daily.        Marland Kitchen VICTOZA 18 MG/3ML SOLN       . VOLTAREN 1 % GEL         No Known Allergies  History   Social History  . Marital Status: Single    Spouse Name: N/A    Number of Children: N/A  . Years of Education: N/A   Occupational History  . Not on file.   Social History Main Topics  . Smoking status: Former Games developer  . Smokeless tobacco: Not on file   Comment: QUIT 21 YEARS AGO  . Alcohol Use: Not on file  . Drug Use: Not on file  . Sexually Active: Not on file   Other Topics Concern  . Not on file   Social History Narrative  . No narrative on file    Family History  Problem Relation Age of Onset  . Arthritis Other   . Colon cancer Other   . Diabetes Other     Review of Systems:  As stated in the HPI and otherwise negative.   BP 126/73  Pulse 73  Ht 5\' 4"  (1.626 m)  Wt 238 lb (107.956 kg)  BMI 40.85 kg/m2  Physical Examination: General: Well developed, well  nourished, NAD HEENT: OP clear, mucus membranes moist SKIN: warm, dry. No rashes. Neuro: No focal deficits Musculoskeletal: Muscle strength 5/5 all ext Psychiatric: Mood and affect normal Neck: No JVD, no carotid bruits, no thyromegaly, no lymphadenopathy. Lungs:Clear bilaterally, no wheezes, rhonci, crackles Cardiovascular: Regular rate and rhythm. No murmurs, gallops or rubs. Abdomen:Soft. Bowel sounds present. Non-tender.  Extremities: No lower extremity edema. Pulses are 2 + in the bilateral DP/PT.  EKG: NSR, LAD. Septal infarct, old.

## 2011-11-16 NOTE — Patient Instructions (Signed)
Your physician wants you to follow-up in:  6 months. You will receive a reminder letter in the mail two months in advance. If you don't receive a letter, please call our office to schedule the follow-up appointment.   

## 2011-11-16 NOTE — Assessment & Plan Note (Signed)
Stable. She is having no angina. BP is well controlled. Lipids are at goal per pt. EKG is unchanged. She can proceed with her knee replacement without further cardiac workup in regards to her cardiac risk. Her major risk factor for this procedure will be stent thrombosis in the 1st generatoin LAD stent as she has previously had stent thrombosis. She has done well on ASA and Effient. She will definetely need to continue ASA during the peri-operative period. She can hold Effient but we may need to consider admitting her three days after Effient is held and using IV heparin or Integrilin before the surgery. She is high risk for recurrent stent thrombosis. Will discuss with colleagues and let her know.

## 2011-11-22 NOTE — Progress Notes (Signed)
Addended by: Reine Just on: 11/22/2011 11:01 AM   Modules accepted: Orders

## 2012-01-15 ENCOUNTER — Telehealth: Payer: Self-pay | Admitting: Cardiovascular Disease

## 2012-01-15 ENCOUNTER — Encounter (HOSPITAL_COMMUNITY): Payer: Self-pay | Admitting: Pharmacy Technician

## 2012-01-15 NOTE — Telephone Encounter (Signed)
Left message to call back  

## 2012-01-15 NOTE — Telephone Encounter (Signed)
New msg Pt wants to talk to you about surgery she is having next tuesday

## 2012-01-17 NOTE — Telephone Encounter (Signed)
I spoke to her surgeon today and gave him a plan. He is holding Effient today and will restart on first post-op day. Can we let her know I spoke to dr. Charlann Boxer? Thanks, chris

## 2012-01-17 NOTE — Telephone Encounter (Signed)
I spoke with [name] who states she is scheduled for knee replacement surgery on June 18,2013 by Dr. Charlann Boxer.  She stopped Effient yesterday (took last dose January 15, 2012). She is taking ASA.  Last office visit note with Dr. Clifton James indicates possible need for heparin after Effient stopped.  I told [name] I would forward note to Dr. Clifton James for his recommendations

## 2012-01-17 NOTE — Telephone Encounter (Signed)
Pt.notified

## 2012-01-17 NOTE — Telephone Encounter (Signed)
Left message to call back  

## 2012-01-17 NOTE — Telephone Encounter (Signed)
Follow up on previous call:  Patient returning call back to pat.

## 2012-01-18 ENCOUNTER — Encounter (HOSPITAL_COMMUNITY): Payer: Self-pay

## 2012-01-18 ENCOUNTER — Encounter (HOSPITAL_COMMUNITY)
Admission: RE | Admit: 2012-01-18 | Discharge: 2012-01-18 | Disposition: A | Discharge status: Home / Self Care | Payer: BC Managed Care – PPO | Source: Ambulatory Visit | Attending: Orthopedic Surgery | Admitting: Orthopedic Surgery

## 2012-01-18 ENCOUNTER — Ambulatory Visit (HOSPITAL_COMMUNITY)
Admission: RE | Admit: 2012-01-18 | Discharge: 2012-01-18 | Disposition: A | Discharge status: Home / Self Care | Payer: BC Managed Care – PPO | Source: Ambulatory Visit | Attending: Orthopedic Surgery | Admitting: Orthopedic Surgery

## 2012-01-18 DIAGNOSIS — Z01818 Encounter for other preprocedural examination: Secondary | ICD-10-CM | POA: Insufficient documentation

## 2012-01-18 DIAGNOSIS — M1710 Unilateral primary osteoarthritis, unspecified knee: Secondary | ICD-10-CM | POA: Insufficient documentation

## 2012-01-18 DIAGNOSIS — Z01812 Encounter for preprocedural laboratory examination: Secondary | ICD-10-CM | POA: Insufficient documentation

## 2012-01-18 HISTORY — DX: Acute myocardial infarction, unspecified: I21.9

## 2012-01-18 LAB — URINALYSIS, ROUTINE W REFLEX MICROSCOPIC
Bilirubin Urine: NEGATIVE
Glucose, UA: NEGATIVE mg/dL
Ketones, ur: NEGATIVE mg/dL
Leukocytes, UA: NEGATIVE
Nitrite: NEGATIVE
Protein, ur: NEGATIVE mg/dL
Specific Gravity, Urine: 1.016 (ref 1.005–1.030)
Urobilinogen, UA: 0.2 mg/dL (ref 0.0–1.0)
pH: 6 (ref 5.0–8.0)

## 2012-01-18 LAB — BASIC METABOLIC PANEL
BUN: 9 mg/dL (ref 6–23)
CO2: 30 mEq/L (ref 19–32)
Calcium: 9.1 mg/dL (ref 8.4–10.5)
Chloride: 103 mEq/L (ref 96–112)
Creatinine, Ser: 0.61 mg/dL (ref 0.50–1.10)
GFR calc Af Amer: >90 mL/min (ref >90)
GFR calc non Af Amer: >90 mL/min (ref >90)
Glucose, Bld: 73 mg/dL (ref 70–99)
Potassium: 3.5 mEq/L (ref 3.5–5.1)
Sodium: 139 mEq/L (ref 135–145)

## 2012-01-18 LAB — CBC
HCT: 35.6 % — ABNORMAL LOW (ref 36.0–46.0)
Hemoglobin: 11 g/dL — ABNORMAL LOW (ref 12.0–15.0)
MCH: 25.3 pg — ABNORMAL LOW (ref 26.0–34.0)
MCHC: 30.9 g/dL (ref 30.0–36.0)
MCV: 81.8 fL (ref 78.0–100.0)
Platelets: 376 10*3/uL (ref 150–400)
RBC: 4.35 MIL/uL (ref 3.87–5.11)
RDW: 15.3 % (ref 11.5–15.5)
WBC: 9.4 10*3/uL (ref 4.0–10.5)

## 2012-01-18 LAB — PROTIME-INR
INR: 0.97 (ref 0.00–1.49)
Prothrombin Time: 13.1 seconds (ref 11.6–15.2)

## 2012-01-18 LAB — DIFFERENTIAL
Basophils Absolute: 0 10*3/uL (ref 0.0–0.1)
Basophils Relative: 0 % (ref 0–1)
Eosinophils Absolute: 0.3 10*3/uL (ref 0.0–0.7)
Eosinophils Relative: 3 % (ref 0–5)
Lymphocytes Relative: 33 % (ref 12–46)
Lymphs Abs: 3.1 10*3/uL (ref 0.7–4.0)
Monocytes Absolute: 0.5 10*3/uL (ref 0.1–1.0)
Monocytes Relative: 5 % (ref 3–12)
Neutro Abs: 5.5 10*3/uL (ref 1.7–7.7)
Neutrophils Relative %: 59 % (ref 43–77)

## 2012-01-18 LAB — SURGICAL PCR SCREEN
MRSA, PCR: INVALID — AB
Staphylococcus aureus: INVALID — AB

## 2012-01-18 LAB — URINE MICROSCOPIC-ADD ON

## 2012-01-18 LAB — APTT: aPTT: 30 seconds (ref 24–37)

## 2012-01-18 NOTE — Pre-Procedure Instructions (Signed)
Called [place] and notified him of abnormal urine and microscopic results for patient.

## 2012-01-18 NOTE — Pre-Procedure Instructions (Signed)
Reviewed pre-op instructions with patient using Teach back method. 

## 2012-01-18 NOTE — H&P (Signed)
[name], [name] NO.:  [id number]  MEDICAL RECORD NO.:  [id number]  LOCATION:  PERIO                        FACILITY:  [facility]  PHYSICIAN:  Madlyn Frankel. Charlann Boxer, M.D.  DATE OF BIRTH:  10/24/1960  DATE OF ADMISSION:  12/20/2011 DATE OF DISCHARGE:                             HISTORY & PHYSICAL   DATE OF SURGERY:  January 23, 2012.  ADMITTING DIAGNOSIS:  End-stage osteoarthritis of the right knee.  HISTORY OF PRESENT ILLNESS:  This is a 51 year old lady with a history of end-stage osteoarthritis of her right knee that has failed conservative management.  After discussion of treatments, benefits, risks, and options, the patient is now scheduled for total knee arthroplasty of the right knee.  Note that her history is complicated by history of MI x2, one present and one post stent.  She is on Effient and aspirin, has clot prevention.  We are currently awaiting recommendations from Dr. Clifton James regarding the timing of stopping her Effient and whether she will need bridging Lovenox and will contact the patient with that information prior to surgery.  If she is maintained on her Effient until 3 days before surgery, then we will need to proceed with general anesthesia and possible femoral nerve block with out a spinal anesthesia.  Otherwise, surgery is to go ahead as scheduled.  The surgery risks, benefits, and aftercare were discussed in detail with the patient, questions invited and answered.  Note that she is planning on going to a nursing facility postoperatively, so she is not given any home medications and she will be put back on her aspirin and Effient postop for both cardiovascular protection and DVT prophylaxis.  Note that her medical doctor is Dr. Mikeal Hawthorne and her cardiologist is Dr. Verne Carrow at [facility]  PAST MEDICAL HISTORY:  Drug allergies none.  CURRENT MEDICATIONS: 1. Glucotrol, dose unknown. 2. Carvedilol 6.25 mg. 3.  Effient 10 mg. 4. Aspirin 81 mg. 5. Coreg CR 10 mg. 6. Humalog 75/25 units. 7. Lovaza 1 g capsule. 8. Glyburide/metformin 5/500. 9. Diovan 80 mg.  The patient will bring dosages and timings to     hospital with her.  SERIOUS MEDICAL ILLNESSES:  Include coronary artery disease with stents, hypertension, diabetes, and  hyperlipidemia.  PREVIOUS SURGERIES:  Include cholecystectomy, arthroscopic knee surgery, and tonsillectomy.  FAMILY HISTORY:  Positive for diabetes and cancer.  SOCIAL HISTORY:  The patient is a Runner, broadcasting/film/video.  She is single.  She does not smoke and does not drink, and again is planning on going to [facility] postoperatively.  REVIEW OF SYSTEMS:  CENTRAL NERVOUS SYSTEM:  Negative for headache, blurred vision, or dizziness.  PULMONARY:  Negative for shortness of breath, PND, and orthopnea.  CARDIOVASCULAR:  Positive for history of MI x2 with stent placement.  Negative for angina, chest pain, or regular heart beats now.  GI:  Positive for hiatal hernia reflux disease.  GU: Negative for urinary tract difficulties.  MUSCULOSKELETAL:  Positive as in HPI.  PHYSICAL EXAMINATION:  VITAL SIGNS:  Blood pressure 138/84, pulse 76 and regular, and respirations 14. HEENT:  Head normocephalic.  Nose patent.  Ears patent.  Pupils equal, round, and  reactive to light.  Throat without injection.  NECK:  Supple without adenopathy.  Carotids 2+ without bruit. CHEST:  Clear to auscultation.  No rales or rhonchi.  Respirations 14. HEART:  Regular rate and rhythm at 76 beats per minute without murmur. ABDOMEN:  Soft with active bowel sounds.  No masses or organomegaly. NEUROLOGIC:  The patient alert and oriented to time, place, and person. Cranial nerves II-XII grossly intact. EXTREMITIES:  Show right knee with an extreme valgus deformity of about 25 degrees.  She has full extension, further flexion to 115 degrees. Neurovascular status intact.  IMPRESSION:  End-stage  osteoarthritis of right knee.  PLAN:  Total knee arthroplasty, right knee.  Note again that we will contact the patient with the recommendations regarding her Effient and aspirin and possibility of bridging Lovenox preoperatively and this will need to be addressed in regards to her anesthesia at the time of surgery.  Questions invited and answered.     [name], P.A.   ______________________________ Madlyn Frankel Charlann Boxer, M.D.    SJC/MEDQ  D:  01/17/2012  T:  01/18/2012  Job:  [id number]

## 2012-01-18 NOTE — Patient Instructions (Signed)
20 [name]  01/18/2012   Your procedure is scheduled on:  Tuesday 01/23/2012 at 0715am  Report to [facility] at 0515 AM.  Call this number if you have problems the morning of surgery: [phone]   Remember:TAKE 1/2 DOSE OF HUMALOG INSULIN NIGHT BEFORE SURGERY AND NO DIABETIC MEDICATIONS THE MORNING OF SURGERY!   Do not eat food:After Midnight.  May have clear liquids:until Midnight .    Take these medicines the morning of surgery with A SIP OF WATER: Coreg, Tramadol   Do not wear jewelry, make-up or nail polish.  Do not wear lotions, powders, or perfumes.   Do not shave 48 hours prior to surgery. Men may shave face and neck.  Do not bring valuables to the hospital.  Contacts, dentures or bridgework may not be worn into surgery.  Leave suitcase in the car. After surgery it may be brought to your room.  For patients admitted to the hospital, checkout time is 11:00 AM the day of discharge.       Special Instructions: CHG Shower Use Special Wash: 1/2 bottle night before surgery and 1/2 bottle morning of surgery.   Please read over the following fact sheets that you were given: MRSA Information, Incentive Spirometry Sheet, Sleep apnea sheet, Blood fact sheet

## 2012-01-19 NOTE — Pre-Procedure Instructions (Signed)
Patient notified of time change for surgery- patient to arrive at Short Stay at 0630am for 0900am case

## 2012-01-20 LAB — MRSA CULTURE

## 2012-01-23 ENCOUNTER — Encounter (HOSPITAL_COMMUNITY): Payer: Self-pay | Admitting: Anesthesiology

## 2012-01-23 ENCOUNTER — Ambulatory Visit (HOSPITAL_COMMUNITY): Payer: BC Managed Care – PPO | Admitting: Anesthesiology

## 2012-01-23 ENCOUNTER — Encounter (HOSPITAL_COMMUNITY)
Admission: RE | Disposition: A | Discharge status: Skilled Nursing Facility | Payer: Self-pay | Source: Ambulatory Visit | Attending: Orthopedic Surgery

## 2012-01-23 ENCOUNTER — Encounter (HOSPITAL_COMMUNITY): Payer: Self-pay | Admitting: *Deleted

## 2012-01-23 ENCOUNTER — Inpatient Hospital Stay (HOSPITAL_COMMUNITY)
Admission: RE | Admit: 2012-01-23 | Discharge: 2012-01-26 | DRG: 209 | Disposition: A | Discharge status: Skilled Nursing Facility | Payer: BC Managed Care – PPO | Source: Ambulatory Visit | Attending: Orthopedic Surgery | Admitting: Orthopedic Surgery

## 2012-01-23 DIAGNOSIS — Z6841 Body Mass Index (BMI) 40.0 and over, adult: Secondary | ICD-10-CM

## 2012-01-23 DIAGNOSIS — E669 Obesity, unspecified: Secondary | ICD-10-CM | POA: Diagnosis present

## 2012-01-23 DIAGNOSIS — Z9861 Coronary angioplasty status: Secondary | ICD-10-CM

## 2012-01-23 DIAGNOSIS — E785 Hyperlipidemia, unspecified: Secondary | ICD-10-CM | POA: Diagnosis present

## 2012-01-23 DIAGNOSIS — E119 Type 2 diabetes mellitus without complications: Secondary | ICD-10-CM | POA: Diagnosis present

## 2012-01-23 DIAGNOSIS — Z96659 Presence of unspecified artificial knee joint: Secondary | ICD-10-CM

## 2012-01-23 DIAGNOSIS — I252 Old myocardial infarction: Secondary | ICD-10-CM

## 2012-01-23 DIAGNOSIS — I2510 Atherosclerotic heart disease of native coronary artery without angina pectoris: Secondary | ICD-10-CM | POA: Diagnosis present

## 2012-01-23 DIAGNOSIS — I10 Essential (primary) hypertension: Secondary | ICD-10-CM | POA: Diagnosis present

## 2012-01-23 DIAGNOSIS — M1710 Unilateral primary osteoarthritis, unspecified knee: Principal | ICD-10-CM | POA: Diagnosis present

## 2012-01-23 HISTORY — PX: TOTAL KNEE ARTHROPLASTY: SHX125

## 2012-01-23 LAB — GLUCOSE, CAPILLARY
Glucose-Capillary: 123 mg/dL — ABNORMAL HIGH (ref 70–99)
Glucose-Capillary: 82 mg/dL (ref 70–99)
Glucose-Capillary: 94 mg/dL (ref 70–99)
Glucose-Capillary: 39 mg/dL — CL (ref 70–99)
Glucose-Capillary: 55 mg/dL — ABNORMAL LOW (ref 70–99)
Glucose-Capillary: 141 mg/dL — ABNORMAL HIGH (ref 70–99)
Glucose-Capillary: 142 mg/dL — ABNORMAL HIGH (ref 70–99)

## 2012-01-23 LAB — TYPE AND SCREEN
ABO/RH(D): B POS
Antibody Screen: NEGATIVE

## 2012-01-23 LAB — ABO/RH: ABO/RH(D): B POS

## 2012-01-23 SURGERY — ARTHROPLASTY, KNEE, TOTAL
Anesthesia: Spinal | Site: Knee | Laterality: Right | Wound class: Clean

## 2012-01-23 MED ORDER — METHOCARBAMOL 100 MG/ML IJ SOLN
500.0000 mg | Freq: Four times a day (QID) | INTRAVENOUS | Status: DC | PRN
  Administered 2012-01-23 – 2012-01-24 (×2): 500 mg via INTRAVENOUS
  Filled 2012-01-23 (×2): qty 5

## 2012-01-23 MED ORDER — KETOROLAC TROMETHAMINE 30 MG/ML IJ SOLN
INTRAMUSCULAR | Status: AC
  Filled 2012-01-23: qty 1

## 2012-01-23 MED ORDER — KETOROLAC TROMETHAMINE 30 MG/ML IJ SOLN
INTRAMUSCULAR | Status: DC | PRN
  Administered 2012-01-23: 30 mg via INTRAVENOUS

## 2012-01-23 MED ORDER — BUPIVACAINE IN DEXTROSE 0.75-8.25 % IT SOLN
INTRATHECAL | Status: DC | PRN
  Administered 2012-01-23: 2 mL via INTRATHECAL

## 2012-01-23 MED ORDER — HYDROMORPHONE HCL PF 1 MG/ML IJ SOLN
0.5000 mg | INTRAMUSCULAR | Status: DC | PRN
  Administered 2012-01-23: 1 mg via INTRAVENOUS
  Filled 2012-01-23: qty 1

## 2012-01-23 MED ORDER — BUPIVACAINE-EPINEPHRINE PF 0.25-1:200000 % IJ SOLN
INTRAMUSCULAR | Status: DC | PRN
  Administered 2012-01-23: 60 mL

## 2012-01-23 MED ORDER — PROPOFOL 10 MG/ML IV BOLUS
INTRAVENOUS | Status: DC | PRN
  Administered 2012-01-23: 30 mg via INTRAVENOUS

## 2012-01-23 MED ORDER — ONDANSETRON HCL 4 MG PO TABS
4.0000 mg | ORAL_TABLET | Freq: Four times a day (QID) | ORAL | Status: DC | PRN
  Filled 2012-01-23: qty 1

## 2012-01-23 MED ORDER — INSULIN ASPART 100 UNIT/ML ~~LOC~~ SOLN
0.0000 [IU] | Freq: Three times a day (TID) | SUBCUTANEOUS | Status: DC
  Administered 2012-01-24 – 2012-01-25 (×2): 8 [IU] via SUBCUTANEOUS
  Administered 2012-01-25: 3 [IU] via SUBCUTANEOUS

## 2012-01-23 MED ORDER — ONDANSETRON HCL 4 MG/2ML IJ SOLN: 4.0000 mg | Freq: Four times a day (QID) | INTRAMUSCULAR | Status: DC | PRN

## 2012-01-23 MED ORDER — BUPIVACAINE-EPINEPHRINE PF 0.25-1:200000 % IJ SOLN
INTRAMUSCULAR | Status: AC
  Filled 2012-01-23: qty 60

## 2012-01-23 MED ORDER — ALUM & MAG HYDROXIDE-SIMETH 200-200-20 MG/5ML PO SUSP
30.0000 mL | ORAL | Status: DC | PRN
Start: 2012-01-23 — End: 2012-01-26

## 2012-01-23 MED ORDER — SODIUM CHLORIDE 0.9 % IV SOLN
INTRAVENOUS | Status: AC
  Administered 2012-01-23 – 2012-01-24 (×2): via INTRAVENOUS
  Filled 2012-01-23 (×7): qty 1000

## 2012-01-23 MED ORDER — LACTATED RINGERS IV SOLN
INTRAVENOUS | Status: DC | PRN
  Administered 2012-01-23 (×3): via INTRAVENOUS

## 2012-01-23 MED ORDER — POLYETHYLENE GLYCOL 3350 17 G PO PACK
17.0000 g | PACK | Freq: Two times a day (BID) | ORAL | Status: DC
  Administered 2012-01-23 – 2012-01-26 (×5): 17 g via ORAL

## 2012-01-23 MED ORDER — FLEET ENEMA 7-19 GM/118ML RE ENEM: 1.0000 | ENEMA | Freq: Once | RECTAL | Status: AC | PRN

## 2012-01-23 MED ORDER — MIDAZOLAM HCL 5 MG/5ML IJ SOLN
INTRAMUSCULAR | Status: DC | PRN
  Administered 2012-01-23: 2 mg via INTRAVENOUS

## 2012-01-23 MED ORDER — PROMETHAZINE HCL 25 MG/ML IJ SOLN: 6.2500 mg | INTRAMUSCULAR | Status: DC | PRN

## 2012-01-23 MED ORDER — ASPIRIN 81 MG PO CHEW
81.0000 mg | CHEWABLE_TABLET | Freq: Every day | ORAL | Status: DC
  Administered 2012-01-24 – 2012-01-26 (×3): 81 mg via ORAL
  Filled 2012-01-23 (×4): qty 1

## 2012-01-23 MED ORDER — FERROUS SULFATE 325 (65 FE) MG PO TABS
325.0000 mg | ORAL_TABLET | Freq: Three times a day (TID) | ORAL | Status: DC
  Administered 2012-01-23 – 2012-01-26 (×9): 325 mg via ORAL
  Filled 2012-01-23 (×12): qty 1

## 2012-01-23 MED ORDER — METOCLOPRAMIDE HCL 5 MG/ML IJ SOLN: 5.0000 mg | Freq: Three times a day (TID) | INTRAMUSCULAR | Status: DC | PRN

## 2012-01-23 MED ORDER — CHLORHEXIDINE GLUCONATE 4 % EX LIQD
60.0000 mL | Freq: Once | CUTANEOUS | Status: DC
  Filled 2012-01-23: qty 60

## 2012-01-23 MED ORDER — MENTHOL 3 MG MT LOZG
1.0000 | LOZENGE | OROMUCOSAL | Status: DC | PRN
  Filled 2012-01-23: qty 9

## 2012-01-23 MED ORDER — PHENOL 1.4 % MT LIQD
1.0000 | OROMUCOSAL | Status: DC | PRN
  Filled 2012-01-23: qty 177

## 2012-01-23 MED ORDER — DIPHENHYDRAMINE HCL 25 MG PO CAPS
25.0000 mg | ORAL_CAPSULE | Freq: Four times a day (QID) | ORAL | Status: DC | PRN
  Administered 2012-01-25 – 2012-01-26 (×3): 25 mg via ORAL
  Filled 2012-01-23 (×3): qty 1

## 2012-01-23 MED ORDER — CARVEDILOL 6.25 MG PO TABS
6.2500 mg | ORAL_TABLET | Freq: Two times a day (BID) | ORAL | Status: DC
  Administered 2012-01-23 – 2012-01-26 (×6): 6.25 mg via ORAL
  Filled 2012-01-23 (×8): qty 1

## 2012-01-23 MED ORDER — GLUCOSE-VITAMIN C 4-6 GM-MG PO CHEW
CHEWABLE_TABLET | ORAL | Status: AC
  Administered 2012-01-23: 1 via ORAL
  Filled 2012-01-23: qty 1

## 2012-01-23 MED ORDER — SODIUM CHLORIDE 0.9 % IR SOLN
Status: DC | PRN
  Administered 2012-01-23: 1000 mL

## 2012-01-23 MED ORDER — INSULIN LISPRO PROT & LISPRO (75-25 MIX) 100 UNIT/ML ~~LOC~~ SUSP
25.0000 [IU] | Freq: Two times a day (BID) | SUBCUTANEOUS | Status: DC
  Administered 2012-01-24: 25 [IU] via SUBCUTANEOUS
  Administered 2012-01-24 – 2012-01-25 (×2): 50 [IU] via SUBCUTANEOUS
  Administered 2012-01-26: 25 [IU] via SUBCUTANEOUS
  Filled 2012-01-23 (×17): qty 0.5

## 2012-01-23 MED ORDER — HYDROMORPHONE HCL PF 1 MG/ML IJ SOLN: 0.2500 mg | INTRAMUSCULAR | Status: DC | PRN

## 2012-01-23 MED ORDER — SODIUM CHLORIDE 0.9 % IR SOLN
Status: DC | PRN
  Administered 2012-01-23: 3000 mL

## 2012-01-23 MED ORDER — HYDROCODONE-ACETAMINOPHEN 7.5-325 MG PO TABS
1.0000 | ORAL_TABLET | ORAL | Status: DC
  Administered 2012-01-23: 2 via ORAL
  Administered 2012-01-23: 1 via ORAL
  Administered 2012-01-24 – 2012-01-26 (×11): 2 via ORAL
  Filled 2012-01-23 (×4): qty 2
  Filled 2012-01-23: qty 1
  Filled 2012-01-23 (×8): qty 2

## 2012-01-23 MED ORDER — METFORMIN HCL 500 MG PO TABS
1000.0000 mg | ORAL_TABLET | Freq: Two times a day (BID) | ORAL | Status: DC
  Administered 2012-01-23 – 2012-01-26 (×5): 1000 mg via ORAL
  Filled 2012-01-23 (×8): qty 2

## 2012-01-23 MED ORDER — CEFAZOLIN SODIUM-DEXTROSE 2-3 GM-% IV SOLR
2.0000 g | Freq: Four times a day (QID) | INTRAVENOUS | Status: AC
  Administered 2012-01-23 (×2): 2 g via INTRAVENOUS
  Filled 2012-01-23 (×2): qty 50

## 2012-01-23 MED ORDER — METHOCARBAMOL 500 MG PO TABS
500.0000 mg | ORAL_TABLET | Freq: Four times a day (QID) | ORAL | Status: DC | PRN
  Administered 2012-01-25 – 2012-01-26 (×3): 500 mg via ORAL
  Filled 2012-01-23 (×3): qty 1

## 2012-01-23 MED ORDER — IRBESARTAN 75 MG PO TABS
75.0000 mg | ORAL_TABLET | Freq: Every day | ORAL | Status: DC
  Administered 2012-01-23 – 2012-01-26 (×4): 75 mg via ORAL
  Filled 2012-01-23 (×4): qty 1

## 2012-01-23 MED ORDER — GLUCOSE-VITAMIN C 4-6 GM-MG PO CHEW
4.0000 | CHEWABLE_TABLET | ORAL | Status: DC | PRN
  Administered 2012-01-23: 1 via ORAL
  Administered 2012-01-23: 3 via ORAL
  Administered 2012-01-24: 4 via ORAL

## 2012-01-23 MED ORDER — CEFAZOLIN SODIUM-DEXTROSE 2-3 GM-% IV SOLR
2.0000 g | INTRAVENOUS | Status: AC
  Administered 2012-01-23: 2 g via INTRAVENOUS

## 2012-01-23 MED ORDER — DOCUSATE SODIUM 100 MG PO CAPS
100.0000 mg | ORAL_CAPSULE | Freq: Two times a day (BID) | ORAL | Status: DC
  Administered 2012-01-23 – 2012-01-26 (×7): 100 mg via ORAL

## 2012-01-23 MED ORDER — GLYBURIDE 5 MG PO TABS
10.0000 mg | ORAL_TABLET | Freq: Two times a day (BID) | ORAL | Status: DC
  Administered 2012-01-23 – 2012-01-26 (×5): 10 mg via ORAL
  Filled 2012-01-23 (×8): qty 2

## 2012-01-23 MED ORDER — FENTANYL CITRATE 0.05 MG/ML IJ SOLN
INTRAMUSCULAR | Status: DC | PRN
  Administered 2012-01-23: 50 ug via INTRAVENOUS

## 2012-01-23 MED ORDER — BISACODYL 5 MG PO TBEC: 5.0000 mg | DELAYED_RELEASE_TABLET | Freq: Every day | ORAL | Status: DC | PRN

## 2012-01-23 MED ORDER — PRASUGREL HCL 10 MG PO TABS
10.0000 mg | ORAL_TABLET | Freq: Every day | ORAL | Status: DC
  Administered 2012-01-24 – 2012-01-26 (×3): 10 mg via ORAL
  Filled 2012-01-23 (×4): qty 1

## 2012-01-23 MED ORDER — METOCLOPRAMIDE HCL 10 MG PO TABS
5.0000 mg | ORAL_TABLET | Freq: Three times a day (TID) | ORAL | Status: DC | PRN
  Administered 2012-01-24: 10 mg via ORAL
  Filled 2012-01-23: qty 1

## 2012-01-23 MED ORDER — CEFAZOLIN SODIUM-DEXTROSE 2-3 GM-% IV SOLR
INTRAVENOUS | Status: AC
  Filled 2012-01-23: qty 50

## 2012-01-23 MED ORDER — GLYBURIDE-METFORMIN 5-500 MG PO TABS: 2.0000 | ORAL_TABLET | Freq: Two times a day (BID) | ORAL | Status: DC

## 2012-01-23 MED ORDER — LIRAGLUTIDE 18 MG/3ML ~~LOC~~ SOLN
1.8000 mL | Freq: Every day | SUBCUTANEOUS | Status: DC
  Administered 2012-01-23: 10.8 mg via SUBCUTANEOUS

## 2012-01-23 MED ORDER — ZOLPIDEM TARTRATE 5 MG PO TABS: 5.0000 mg | ORAL_TABLET | Freq: Every evening | ORAL | Status: DC | PRN

## 2012-01-23 MED ORDER — PROPOFOL 10 MG/ML IV EMUL
INTRAVENOUS | Status: DC | PRN
  Administered 2012-01-23: 200 ug/kg/min via INTRAVENOUS

## 2012-01-23 SURGICAL SUPPLY — 59 items
ADH SKN CLS APL DERMABOND .7 (GAUZE/BANDAGES/DRESSINGS) ×1
BAG SPEC THK2 15X12 ZIP CLS (MISCELLANEOUS) ×1
BAG ZIPLOCK 12X15 (MISCELLANEOUS) ×2 IMPLANT
BANDAGE ELASTIC 6 VELCRO ST LF (GAUZE/BANDAGES/DRESSINGS) ×2 IMPLANT
BANDAGE ESMARK 6X9 LF (GAUZE/BANDAGES/DRESSINGS) ×1 IMPLANT
BLADE SAW SGTL 13.0X1.19X90.0M (BLADE) ×2 IMPLANT
BNDG CMPR 9X6 STRL LF SNTH (GAUZE/BANDAGES/DRESSINGS) ×1
BNDG ESMARK 6X9 LF (GAUZE/BANDAGES/DRESSINGS) ×2
BONE CEMENT GENTAMICIN (Cement) ×4 IMPLANT
BOWL SMART MIX CTS (DISPOSABLE) ×2 IMPLANT
CEMENT BONE GENTAMICIN 40 (Cement) IMPLANT
CLOTH BEACON ORANGE TIMEOUT ST (SAFETY) ×2 IMPLANT
CUFF TOURN SGL QUICK 34 (TOURNIQUET CUFF) ×2
CUFF TRNQT CYL 34X4X40X1 (TOURNIQUET CUFF) ×1 IMPLANT
DECANTER SPIKE VIAL GLASS SM (MISCELLANEOUS) ×2 IMPLANT
DERMABOND ADVANCED (GAUZE/BANDAGES/DRESSINGS) ×1
DERMABOND ADVANCED .7 DNX12 (GAUZE/BANDAGES/DRESSINGS) ×1 IMPLANT
DRAPE EXTREMITY T 121X128X90 (DRAPE) ×2 IMPLANT
DRAPE POUCH INSTRU U-SHP 10X18 (DRAPES) ×2 IMPLANT
DRAPE U-SHAPE 47X51 STRL (DRAPES) ×2 IMPLANT
DRSG AQUACEL AG ADV 3.5X10 (GAUZE/BANDAGES/DRESSINGS) ×2 IMPLANT
DRSG TEGADERM 4X4.75 (GAUZE/BANDAGES/DRESSINGS) ×2 IMPLANT
DURAPREP 26ML APPLICATOR (WOUND CARE) ×2 IMPLANT
ELECT REM PT RETURN 9FT ADLT (ELECTROSURGICAL) ×2
ELECTRODE REM PT RTRN 9FT ADLT (ELECTROSURGICAL) ×1 IMPLANT
EVACUATOR 1/8 PVC DRAIN (DRAIN) ×2 IMPLANT
FACESHIELD LNG OPTICON STERILE (SAFETY) ×10 IMPLANT
GAUZE SPONGE 2X2 8PLY STRL LF (GAUZE/BANDAGES/DRESSINGS) ×1 IMPLANT
GLOVE BIOGEL PI IND STRL 7.5 (GLOVE) ×1 IMPLANT
GLOVE BIOGEL PI IND STRL 8 (GLOVE) ×1 IMPLANT
GLOVE BIOGEL PI INDICATOR 7.5 (GLOVE) ×1
GLOVE BIOGEL PI INDICATOR 8 (GLOVE) ×1
GLOVE ECLIPSE 8.0 STRL XLNG CF (GLOVE) ×2 IMPLANT
GLOVE ORTHO TXT STRL SZ7.5 (GLOVE) ×4 IMPLANT
GOWN BRE IMP PREV XXLGXLNG (GOWN DISPOSABLE) ×4 IMPLANT
GOWN STRL NON-REIN LRG LVL3 (GOWN DISPOSABLE) ×2 IMPLANT
HANDPIECE INTERPULSE COAX TIP (DISPOSABLE) ×2
IMMOBILIZER KNEE 20 (SOFTGOODS)
IMMOBILIZER KNEE 20 THIGH 36 (SOFTGOODS) IMPLANT
KIT BASIN OR (CUSTOM PROCEDURE TRAY) ×2 IMPLANT
MANIFOLD NEPTUNE II (INSTRUMENTS) ×2 IMPLANT
NDL SAFETY ECLIPSE 18X1.5 (NEEDLE) ×1 IMPLANT
NEEDLE HYPO 18GX1.5 SHARP (NEEDLE) ×2
NS IRRIG 1000ML POUR BTL (IV SOLUTION) ×4 IMPLANT
PACK TOTAL JOINT (CUSTOM PROCEDURE TRAY) ×2 IMPLANT
POSITIONER SURGICAL ARM (MISCELLANEOUS) ×2 IMPLANT
SET HNDPC FAN SPRY TIP SCT (DISPOSABLE) ×1 IMPLANT
SET PAD KNEE POSITIONER (MISCELLANEOUS) ×2 IMPLANT
SPONGE GAUZE 2X2 STER 10/PKG (GAUZE/BANDAGES/DRESSINGS) ×1
SUCTION FRAZIER 12FR DISP (SUCTIONS) ×2 IMPLANT
SUT MNCRL AB 4-0 PS2 18 (SUTURE) ×2 IMPLANT
SUT VIC AB 1 CT1 36 (SUTURE) ×6 IMPLANT
SUT VIC AB 2-0 CT1 27 (SUTURE) ×6
SUT VIC AB 2-0 CT1 TAPERPNT 27 (SUTURE) ×3 IMPLANT
SYR 50ML LL SCALE MARK (SYRINGE) ×2 IMPLANT
TOWEL OR 17X26 10 PK STRL BLUE (TOWEL DISPOSABLE) ×4 IMPLANT
TRAY FOLEY CATH 14FRSI W/METER (CATHETERS) ×2 IMPLANT
WATER STERILE IRR 1500ML POUR (IV SOLUTION) ×2 IMPLANT
WRAP KNEE MAXI GEL POST OP (GAUZE/BANDAGES/DRESSINGS) ×2 IMPLANT

## 2012-01-23 NOTE — Anesthesia Procedure Notes (Signed)
Spinal  Patient location during procedure: OR Start time: 01/23/2012 9:20 AM Staffing Anesthesiologist: Azell Der Performed by: anesthesiologist  Preanesthetic Checklist Completed: patient identified, site marked, surgical consent, pre-op evaluation, timeout performed, IV checked, risks and benefits discussed and monitors and equipment checked Spinal Block Patient position: sitting Prep: Betadine Patient monitoring: heart rate, continuous pulse ox and blood pressure Approach: midline Location: L3-4 Injection technique: single-shot Needle Needle type: Sprotte  Needle gauge: 24 G Needle length: 12.7 cm Additional Notes Expiration date of kit checked and confirmed. Patient tolerated procedure well, without complications. No paresthesia.

## 2012-01-23 NOTE — Anesthesia Preprocedure Evaluation (Addendum)
Anesthesia Evaluation  Patient identified by MRN, date of birth, ID band Patient awake    Reviewed: Allergy & Precautions, H&P , NPO status , Patient's Chart, lab work & pertinent test results  Airway Mallampati: II TM Distance: >3 FB Neck ROM: Full    Dental No notable dental hx.    Pulmonary neg pulmonary ROS,  breath sounds clear to auscultation  Pulmonary exam normal       Cardiovascular hypertension, Pt. on home beta blockers + CAD and + Past MI Rhythm:Regular Rate:Normal     Neuro/Psych negative neurological ROS  negative psych ROS   GI/Hepatic negative GI ROS, Neg liver ROS,   Endo/Other  Diabetes mellitus-, Type 2, Oral Hypoglycemic Agents  Renal/GU negative Renal ROS  negative genitourinary   Musculoskeletal negative musculoskeletal ROS (+)   Abdominal (+) + obese,   Peds negative pediatric ROS (+)  Hematology negative hematology ROS (+)   Anesthesia Other Findings   Reproductive/Obstetrics negative OB ROS                           Anesthesia Physical Anesthesia Plan  ASA: III  Anesthesia Plan: Spinal   Post-op Pain Management:    Induction: Intravenous  Airway Management Planned:   Additional Equipment:   Intra-op Plan:   Post-operative Plan: Extubation in OR  Informed Consent: I have reviewed the patients History and Physical, chart, labs and discussed the procedure including the risks, benefits and alternatives for the proposed anesthesia with the patient or authorized representative who has indicated his/her understanding and acceptance.     Plan Discussed with: CRNA  Anesthesia Plan Comments: (Discussed risks/benefits of spinal including headache, backache, failure, bleeding, infection, and nerve damage. Patient consents to spinal. Questions answered. Coagulation studies and platelet count acceptable. Has been off Effient for 8 days which is within guidelines for  spinal. Discussed Xeralto use with Dr. Charlann Boxer. Spinal placement slightly traumatic.  First dose will be greater than 24 hours after spinal placement per manufacturer recommendations.)     Anesthesia Quick Evaluation

## 2012-01-23 NOTE — Interval H&P Note (Signed)
History and Physical Interval Note:  01/23/2012 7:09 AM  [name]  has presented today for surgery, with the diagnosis of Osteoarthritis of the Right Knee  The various methods of treatment have been discussed with the patient and family. After consideration of risks, benefits and other options for treatment, the patient has consented to  Procedure(s) (LRB): RIGHT TOTAL KNEE ARTHROPLASTY (Right) as a surgical intervention .  The patient's history has been reviewed, patient examined, no change in status, stable for surgery.  I have reviewed the patients' chart and labs.  Questions were answered to the patient's satisfaction.     Shelda Pal

## 2012-01-23 NOTE — Transfer of Care (Signed)
Immediate Anesthesia Transfer of Care Note  Patient: [name]  Procedure(s) Performed: Procedure(s) (LRB): TOTAL KNEE ARTHROPLASTY (Right)  Patient Location: PACU  Anesthesia Type: Regional and Spinal  Level of Consciousness: awake, sedated and patient cooperative  Airway & Oxygen Therapy: Patient Spontanous Breathing and Patient connected to face mask oxygen  Post-op Assessment: Report given to PACU RN and Post -op Vital signs reviewed and stable  Post vital signs: Reviewed and stable  Complications: No apparent anesthesia complications

## 2012-01-23 NOTE — Anesthesia Postprocedure Evaluation (Signed)
  Anesthesia Post-op Note  Patient: [name]  Procedure(s) Performed: Procedure(s) (LRB): TOTAL KNEE ARTHROPLASTY (Right)  Patient Location: PACU  Anesthesia Type: Spinal  Level of Consciousness: awake and alert   Airway and Oxygen Therapy: Patient Spontanous Breathing  Post-op Pain: mild  Post-op Assessment: Post-op Vital signs reviewed, Patient's Cardiovascular Status Stable, Respiratory Function Stable, Patent Airway and No signs of Nausea or vomiting  Post-op Vital Signs: stable  Complications: No apparent anesthesia complications

## 2012-01-23 NOTE — Op Note (Signed)
NAME:  [name]                      MEDICAL RECORD NO.:  [id number]                             FACILITY:  [facility]      PHYSICIAN:  Madlyn Frankel. Charlann Boxer, M.D.  DATE OF BIRTH:  1961-01-12      DATE OF PROCEDURE:  01/23/2012                                     OPERATIVE REPORT         PREOPERATIVE DIAGNOSIS:  Right knee osteoarthritis.      POSTOPERATIVE DIAGNOSIS:  Right knee osteoarthritis.  Diabetes     FINDINGS:  The patient was noted to have complete loss of cartilage and   bone-on-bone arthritis with associated osteophytes in the lateral and patellofemoral compartments of   the knee with a significant synovitis and associated effusion.      PROCEDURE:  Right total knee replacement.      COMPONENTS USED:  DePuy rotating platform posterior stabilized knee   system, a size 3 femur, 2.5 tibia, 12.5 mm insert, and 38 patellar   button.      SURGEON:  Madlyn Frankel. Charlann Boxer, M.D.      ASSISTANT:  Lanney Gins, PA-C.      ANESTHESIA:  Spinal.      SPECIMENS:  None.      COMPLICATION:  None.      DRAINS:  One Hemovac.  EBL: <150cc      TOURNIQUET TIME:   Total Tourniquet Time Documented: Thigh (Right) - 51 minutes .      The patient was stable to the recovery room.      INDICATION FOR PROCEDURE:  [name] is a 51 y.o. female patient of   mine.  The patient had been seen, evaluated, and treated conservatively in the   office with medication, activity modification, and injections.  The patient had   radiographic changes of bone-on-bone arthritis with endplate sclerosis and osteophytes noted.      The patient failed conservative measures including medication, injections, and activity modification, and at this point was ready for more definitive measures.   Based on the radiographic changes and failed conservative measures, the patient   decided to proceed with total knee replacement.  Risks of infection,   DVT, component failure, need for revision surgery, postop  course, and   expectations were all   discussed and reviewed.  Consent was obtained for benefit of pain   relief.      PROCEDURE IN DETAIL:  The patient was brought to the operative theater.   Once adequate anesthesia, preoperative antibiotics, 2 gm of Ancef administered, the patient was positioned supine with the right thigh tourniquet placed.  The  right lower extremity was prepped and draped in sterile fashion.  A time-   out was performed identifying the patient, planned procedure, and   extremity.      The right lower extremity was placed in the [facility] leg holder.  The leg was   exsanguinated, tourniquet elevated to 250 mmHg.  A midline incision was   made followed by median parapatellar arthrotomy.  Following initial   exposure, attention was first directed to the patella.  Precut  measurement was noted to be 24 mm.  I resected down to 14 mm and used a   38 patellar button to restore patellar height as well as cover the cut   surface.      The lug holes were drilled and a metal shim was placed to protect the   patella from retractors and saw blades.      At this point, attention was now directed to the femur.  The femoral   canal was opened with a drill, irrigated to try to prevent fat emboli.  An   intramedullary rod was passed at 5 degrees valgus, 10 mm of bone was   resected off the distal femur.  Following this resection, the tibia was   subluxated anteriorly.  Using the extramedullary guide, 6 mm of bone was resected off   the proximal lateral tibia.  We confirmed the gap would be   stable medially and laterally with a 10 mm insert as well as confirmed   the cut was perpendicular in the coronal plane, checking with an alignment rod.      Once this was done, I sized the femur to be a size 3 in the anterior-   posterior dimension, chose a standard component based on medial and   lateral dimension.  The size 3 rotation block was then pinned in   position anterior referenced  using the C-clamp to set rotation.  The   anterior, posterior, and  chamfer cuts were made without difficulty nor   notching making certain that I was along the anterior cortex to help   with flexion gap stability.      The final box cut was made off the lateral aspect of distal femur.      At this point, the tibia was sized to be a size 2.5, the size 2.5 tray was   then pinned in position through the medial third of the tubercle,   drilled, and keel punched.  Trial reduction was now carried with a 3 femur,  2.5 tibia, a 10 mm insert, and the 38 patella botton.  The knee was brought to   extension, full extension with good flexion stability with the patella   tracking through the trochlea without application of pressure.  Given   all these findings, the trial components removed.  Final components were   opened and cement was mixed.  The knee was irrigated with normal saline   solution and pulse lavage.  The synovial lining was   then injected with 0.25% Marcaine with epinephrine and 1 cc of Toradol,   total of 61 cc.      The knee was irrigated.  Final implants were then cemented onto clean and   dried cut surfaces of bone with the knee brought to extension with a 12.5   mm trial insert.      Once the cement had fully cured, the excess cement was removed   throughout the knee.  I confirmed I was satisfied with the range of   motion and stability, and the final 12.5 mm PS insert was chosen.  It was   placed into the knee.      The tourniquet had been let down at 50 minutes.  No significant   hemostasis required.  The medium Hemovac drain was placed deep.  The   extensor mechanism was then reapproximated using #1 Vicryl with the knee   in flexion.  The   remaining wound was closed with 2-0  Vicryl and running 4-0 Monocryl.   The knee was cleaned, dried, dressed sterilely using Dermabond and   Aquacel dressing.  Drain site dressed separately.  The patient was then   brought to recovery  room in stable condition, tolerating the procedure   well.   Please note that Physician Assistant, Lanney Gins, was present for the entirety of the case, and was utilized for pre-operative positioning, peri-operative retractor management, general facilitation of the procedure.  He was also utilized for primary wound closure at the end of the case.              Madlyn Frankel Charlann Boxer, M.D.

## 2012-01-24 LAB — BASIC METABOLIC PANEL
BUN: 8 mg/dL (ref 6–23)
CO2: 27 mEq/L (ref 19–32)
Calcium: 8.1 mg/dL — ABNORMAL LOW (ref 8.4–10.5)
Chloride: 100 mEq/L (ref 96–112)
Creatinine, Ser: 0.58 mg/dL (ref 0.50–1.10)
GFR calc Af Amer: >90 mL/min (ref >90)
GFR calc non Af Amer: >90 mL/min (ref >90)
Glucose, Bld: 292 mg/dL — ABNORMAL HIGH (ref 70–99)
Potassium: 3.6 mEq/L (ref 3.5–5.1)
Sodium: 134 mEq/L — ABNORMAL LOW (ref 135–145)

## 2012-01-24 LAB — CBC
HCT: 28.7 % — ABNORMAL LOW (ref 36.0–46.0)
Hemoglobin: 8.8 g/dL — ABNORMAL LOW (ref 12.0–15.0)
MCH: 25.4 pg — ABNORMAL LOW (ref 26.0–34.0)
MCHC: 30.7 g/dL (ref 30.0–36.0)
MCV: 82.7 fL (ref 78.0–100.0)
Platelets: 266 10*3/uL (ref 150–400)
RBC: 3.47 MIL/uL — ABNORMAL LOW (ref 3.87–5.11)
RDW: 15.4 % (ref 11.5–15.5)
WBC: 9.2 10*3/uL (ref 4.0–10.5)

## 2012-01-24 LAB — GLUCOSE, CAPILLARY
Glucose-Capillary: 253 mg/dL — ABNORMAL HIGH (ref 70–99)
Glucose-Capillary: 97 mg/dL (ref 70–99)
Glucose-Capillary: 105 mg/dL — ABNORMAL HIGH (ref 70–99)
Glucose-Capillary: 36 mg/dL — CL (ref 70–99)
Glucose-Capillary: 103 mg/dL — ABNORMAL HIGH (ref 70–99)
Glucose-Capillary: 58 mg/dL — ABNORMAL LOW (ref 70–99)

## 2012-01-24 NOTE — Plan of Care (Signed)
Problem: Consults Goal: Diagnosis- Total Joint Replacement Outcome: Completed/Met Date Met:  01/24/12 Primary Total Knee RIGHT

## 2012-01-24 NOTE — Plan of Care (Signed)
Problem: Consults Goal: Diagnosis- Total Joint Replacement Primary Total Knee RIGHT     

## 2012-01-24 NOTE — Progress Notes (Signed)
  Subjective: 1 Day Post-Op Procedure(s) (LRB): TOTAL KNEE ARTHROPLASTY (Right)   Patient reports pain as mild, pain well controlled. No events throughout the night.   Objective:   VITALS:   Filed Vitals:   01/24/12 1024  BP: 120/77  Pulse: 88  Temp: 98.2 F (36.8 C)  Resp: 16    Neurovascular intact Dorsiflexion/Plantar flexion intact Incision: dressing C/D/I No cellulitis present Compartment soft  LABS  Basename 01/24/12 0425  HGB 8.8*  HCT 28.7*  WBC 9.2  PLT 266     Basename 01/24/12 0425  NA 134*  K 3.6  BUN 8  CREATININE 0.58  GLUCOSE 292*     Assessment/Plan: 1 Day Post-Op Procedure(s) (LRB): TOTAL KNEE ARTHROPLASTY (Right)   HV drain d/c'ed Foley cath d/c'ed Advance diet Up with therapy D/C IV fluids Discharge home with home health, possibly Thursday if continues to do well.   Anastasio Auerbach Nakhi Choi   PAC  01/24/2012, 11:06 AM

## 2012-01-24 NOTE — Progress Notes (Signed)
Clinical Social Work Department CLINICAL SOCIAL WORK PLACEMENT NOTE 01/24/2012  Patient:  [name], [name]  Account Number:  [id number] Admit date:  01/23/2012  Clinical Social Worker:  Cori Razor, LCSW  Date/time:  01/24/2012 04:12 PM  Clinical Social Work is seeking post-discharge placement for this patient at the following level of care:   SKILLED NURSING   (*CSW will update this form in Epic as items are completed)   01/24/2012  Patient/family provided with [facility] Department of Clinical Social Work's list of facilities offering this level of care within the geographic area requested by the patient (or if unable, by the patient's family).  01/24/2012  Patient/family informed of their freedom to choose among providers that offer the needed level of care, that participate in Medicare, Medicaid or managed care program needed by the patient, have an available bed and are willing to accept the patient.  01/24/2012  Patient/family informed of [facility]' ownership interest in [facility], as well as of the fact that they are under no obligation to receive care at this facility.  PASARR submitted to EDS on 01/24/2012 PASARR number received from EDS on 01/24/2012  FL2 transmitted to all facilities in geographic area requested by pt/family on  01/24/2012 FL2 transmitted to all facilities within larger geographic area on   Patient informed that his/her managed care company has contracts with or will negotiate with  certain facilities, including the following:     Patient/family informed of bed offers received:  01/24/2012 Patient chooses bed at [facility] Physician recommends and patient chooses bed at    Patient to be transferred to  on   Patient to be transferred to facility by   The following physician request were entered in Epic:   Additional Comments: BCBS prior approval is required for SNF placement. Pt is aware of this.  Cori Razor LCSW [phone]

## 2012-01-24 NOTE — Progress Notes (Signed)
Clinical Social Work Department BRIEF PSYCHOSOCIAL ASSESSMENT 01/24/2012  Patient:  [name], [name]     Account Number:  [id number]     Admit date:  01/23/2012  Clinical Social Worker:  Candie Chroman  Date/Time:  01/24/2012 04:04 PM  Referred by:  Physician  Date Referred:  01/24/2012 Referred for  SNF Placement   Other Referral:   Interview type:  Patient Other interview type:    PSYCHOSOCIAL DATA Living Status:  ALONE Admitted from facility:   Level of care:   Primary support name:  Jeniyah Menor Primary support relationship to patient:  SIBLING Degree of support available:   supportive    CURRENT CONCERNS Current Concerns  Post-Acute Placement   Other Concerns:    SOCIAL WORK ASSESSMENT / PLAN Pt is a 51 yr old female living at home, alone, prior to hospitalization.Met with pt and family to assist with d/c planning.Pt requesting ST SNF placement upon hospital d/c. CSW has initiated SNF search, provided bed offers . Pt has accepted bed at [facility] SNF is in the process of requesting prior approval from [facility].   Assessment/plan status:  Psychosocial Support/Ongoing Assessment of Needs Other assessment/ plan:   Information/referral to community resources:   SNF list provided. Insurance process reviewed.    PATIENT'S/FAMILY'S RESPONSE TO PLAN OF CARE: Pt is looking forward to ST SNF placement at [facility].    Cori Razor LCSW [phone]

## 2012-01-24 NOTE — Progress Notes (Signed)
Physical Therapy Treatment Patient Details Name: [name] MRN: [id number] DOB: [birth year] Today's Date: 01/24/2012 Time: 2440-1027 PT Time Calculation (min): 17 min  PT Assessment / Plan / Recommendation Comments on Treatment Session  Pt refused a second ambulation during second session, however she did agree to perform exercises in bed.  RN notified of pain.     Follow Up Recommendations  Skilled nursing facility    Barriers to Discharge        Equipment Recommendations  Defer to next venue    Recommendations for Other Services    Frequency 7X/week   Plan Discharge plan remains appropriate    Precautions / Restrictions Precautions Precautions: Knee Required Braces or Orthoses: Knee Immobilizer - Right Knee Immobilizer - Right: Discontinue once straight leg raise with < 10 degree lag Restrictions Weight Bearing Restrictions: No Other Position/Activity Restrictions: WBAT   Pertinent Vitals/Pain 8/10    Mobility       Exercises Total Joint Exercises Ankle Circles/Pumps: AROM;Both;20 reps Quad Sets: AROM;Right;10 reps Heel Slides: AAROM;Right;10 reps Hip ABduction/ADduction: AAROM;Right;10 reps Straight Leg Raises: AAROM;Right;10 reps   PT Diagnosis:    PT Problem List:   PT Treatment Interventions:     PT Goals Acute Rehab PT Goals PT Goal Formulation: With patient Time For Goal Achievement: 01/27/12 Potential to Achieve Goals: Good  Visit Information  Last PT Received On: 01/24/12 Assistance Needed: +1    Subjective Data  Subjective: My leg is burning so bad, I don't want to walk again.  I walked to the bathroom again.  Patient Stated Goal: to get better   Cognition  Overall Cognitive Status: Appears within functional limits for tasks assessed/performed Arousal/Alertness: Awake/alert Orientation Level: Appears intact for tasks assessed Behavior During Session: [facility] for tasks performed    Balance     End of Session PT - End of Session Activity  Tolerance: Patient limited by pain Patient left: in bed;with call bell/phone within reach;with family/visitor present Nurse Communication: Mobility status    Page, Meribeth Mattes 01/24/2012, 3:39 PM

## 2012-01-24 NOTE — Evaluation (Signed)
Physical Therapy Evaluation Patient Details Name: [name] MRN: [id number] DOB: 06-05-1961 Today's Date: 01/24/2012 Time: 8295-6213 PT Time Calculation (min): 19 min  PT Assessment / Plan / Recommendation Clinical Impression  Pt presents s/p R TKA POD 1 with decreased strength, ROM and mobility.  Tolerated ambulation in hall very well with no c/o dizziness/lightheadedness.  Pt will benefit from skilled PT in acute venue to address deficits.  Pt states that she lives alone and there is no one that can temporarily stay with her, therefore PT recommends short term SNF stay to rincrease pt safety.      PT Assessment  Patient needs continued PT services    Follow Up Recommendations  Skilled nursing facility    Barriers to Discharge Decreased caregiver support      lEquipment Recommendations  Defer to next venue    Recommendations for Other Services OT consult   Frequency 7X/week    Precautions / Restrictions Precautions Precautions: Knee Required Braces or Orthoses: Knee Immobilizer - Right Knee Immobilizer - Right: Discontinue once straight leg raise with < 10 degree lag Restrictions Weight Bearing Restrictions: No Other Position/Activity Restrictions: WBAT   Pertinent Vitals/Pain 6/10      Mobility  Bed Mobility Bed Mobility: Supine to Sit Supine to Sit: 4: Min guard;HOB elevated Details for Bed Mobility Assistance: Min/guard for safety for RLE off of bed with cues for hand placement.  Transfers Transfers: Sit to Stand;Stand to Sit Sit to Stand: 4: Min assist;With upper extremity assist;From bed;From elevated surface Stand to Sit: 4: Min assist;With upper extremity assist;With armrests;To chair/3-in-1 Details for Transfer Assistance: Some assist for steadying once in standing.  Cues for hand placement and LE management when sitting/standing.  Ambulation/Gait Ambulation/Gait Assistance: 4: Min assist Ambulation Distance (Feet): 40 Feet Assistive device: Rolling  walker Ambulation/Gait Assistance Details: Assist for steadying with cues for sequencing/technique with RW and to maintain upright posture while ambulating.  Gait Pattern: Step-to pattern;Decreased stride length;Decreased stance time - right;Decreased step length - left;Trunk flexed Gait velocity: decreased Stairs: No Wheelchair Mobility Wheelchair Mobility: No    Exercises     PT Diagnosis: Difficulty walking;Abnormality of gait;Acute pain;Generalized weakness  PT Problem List: Decreased strength;Decreased range of motion;Decreased activity tolerance;Decreased balance;Decreased mobility;Decreased knowledge of use of DME;Pain PT Treatment Interventions: DME instruction;Gait training;Functional mobility training;Therapeutic activities;Therapeutic exercise;Balance training;Patient/family education   PT Goals Acute Rehab PT Goals PT Goal Formulation: With patient Time For Goal Achievement: 01/27/12 Potential to Achieve Goals: Good Pt will go Sit to Supine/Side: with supervision PT Goal: Sit to Supine/Side - Progress: Goal set today Pt will go Sit to Stand: with supervision PT Goal: Sit to Stand - Progress: Goal set today Pt will go Stand to Sit: with supervision PT Goal: Stand to Sit - Progress: Goal set today Pt will Ambulate: 51 - 150 feet;with supervision;with least restrictive assistive device PT Goal: Ambulate - Progress: Goal set today  Visit Information  Last PT Received On: 01/24/12 Assistance Needed: +1    Subjective Data  Subjective: are we going to walk down the hall Patient Stated Goal: to get better   Prior Functioning  Home Living Lives With: Alone Type of Home: House Home Access: Stairs to enter [employer] of Steps: 1 Entrance Stairs-Rails: None Home Layout: One level Bathroom Shower/Tub: Engineer, manufacturing systems: Handicapped height Home Adaptive Equipment: Straight cane Prior Function Level of Independence: Independent Able to Take  Stairs?: Yes Driving: Yes Vocation: Full time employment Communication Communication: No difficulties  Cognition  Overall Cognitive Status: Appears within functional limits for tasks assessed/performed Arousal/Alertness: Awake/alert Orientation Level: Appears intact for tasks assessed Behavior During Session: [facility] for tasks performed    Extremity/Trunk Assessment Right Lower Extremity Assessment RLE ROM/Strength/Tone: Deficits RLE ROM/Strength/Tone Deficits: ankle motions WFL, able to perform SLR with <10 deg lag, therefore will D/C KI for pm session.  Noted that R foot is externally rotated.   RLE Sensation: WFL - Light Touch RLE Coordination: WFL - gross motor Left Lower Extremity Assessment LLE ROM/Strength/Tone: WFL for tasks assessed LLE Sensation: WFL - Light Touch LLE Coordination: WFL - gross motor Trunk Assessment Trunk Assessment: Normal   Balance    End of Session PT - End of Session Equipment Utilized During Treatment: Right knee immobilizer Activity Tolerance: Patient tolerated treatment well Patient left: in chair;with call bell/phone within reach;with family/visitor present Nurse Communication: Mobility status   Page, Meribeth Mattes 01/24/2012, 10:25 AM

## 2012-01-25 LAB — GLUCOSE, CAPILLARY
Glucose-Capillary: 65 mg/dL — ABNORMAL LOW (ref 70–99)
Glucose-Capillary: 159 mg/dL — ABNORMAL HIGH (ref 70–99)
Glucose-Capillary: 270 mg/dL — ABNORMAL HIGH (ref 70–99)
Glucose-Capillary: 74 mg/dL (ref 70–99)

## 2012-01-25 LAB — BASIC METABOLIC PANEL
BUN: 6 mg/dL (ref 6–23)
CO2: 27 mEq/L (ref 19–32)
Calcium: 9.1 mg/dL (ref 8.4–10.5)
Chloride: 100 mEq/L (ref 96–112)
Creatinine, Ser: 0.6 mg/dL (ref 0.50–1.10)
GFR calc Af Amer: >90 mL/min (ref >90)
GFR calc non Af Amer: >90 mL/min (ref >90)
Glucose, Bld: 90 mg/dL (ref 70–99)
Potassium: 3.2 mEq/L — ABNORMAL LOW (ref 3.5–5.1)
Sodium: 136 mEq/L (ref 135–145)

## 2012-01-25 LAB — CBC
HCT: 29.3 % — ABNORMAL LOW (ref 36.0–46.0)
Hemoglobin: 9 g/dL — ABNORMAL LOW (ref 12.0–15.0)
MCH: 25.1 pg — ABNORMAL LOW (ref 26.0–34.0)
MCHC: 30.7 g/dL (ref 30.0–36.0)
MCV: 81.8 fL (ref 78.0–100.0)
Platelets: 279 10*3/uL (ref 150–400)
RBC: 3.58 MIL/uL — ABNORMAL LOW (ref 3.87–5.11)
RDW: 15.1 % (ref 11.5–15.5)
WBC: 11.4 10*3/uL — ABNORMAL HIGH (ref 4.0–10.5)

## 2012-01-25 MED ORDER — DIPHENHYDRAMINE HCL 25 MG PO CAPS: 25.0000 mg | ORAL_CAPSULE | Freq: Four times a day (QID) | ORAL | Status: DC | PRN

## 2012-01-25 MED ORDER — METHOCARBAMOL 500 MG PO TABS: 500.0000 mg | ORAL_TABLET | Freq: Four times a day (QID) | ORAL | Status: AC | PRN

## 2012-01-25 MED ORDER — POLYETHYLENE GLYCOL 3350 17 G PO PACK: 17.0000 g | PACK | Freq: Two times a day (BID) | ORAL | Status: AC

## 2012-01-25 MED ORDER — FERROUS SULFATE 325 (65 FE) MG PO TABS: 325.0000 mg | ORAL_TABLET | Freq: Three times a day (TID) | ORAL | Status: DC

## 2012-01-25 MED ORDER — HYDROCODONE-ACETAMINOPHEN 7.5-325 MG PO TABS: 1.0000 | ORAL_TABLET | ORAL | Status: AC

## 2012-01-25 MED ORDER — DSS 100 MG PO CAPS: 100.0000 mg | ORAL_CAPSULE | Freq: Two times a day (BID) | ORAL | Status: AC

## 2012-01-25 NOTE — Progress Notes (Signed)
Physical Therapy Treatment Patient Details Name: [name] MRN: [id number] DOB: 1960-09-06 Today's Date: 01/25/2012 Time: 7829-5621 PT Time Calculation (min): 22 min  PT Assessment / Plan / Recommendation Comments on Treatment Session  Progressing well.     Follow Up Recommendations  Skilled nursing facility    Barriers to Discharge        Equipment Recommendations  Defer to next venue    Recommendations for Other Services    Frequency 7X/week   Plan Discharge plan remains appropriate    Precautions / Restrictions Precautions Precautions: Knee Required Braces or Orthoses: Knee Immobilizer - Right Knee Immobilizer - Right: Discontinue once straight leg raise with < 10 degree lag Restrictions Weight Bearing Restrictions: No RLE Weight Bearing: Weight bearing as tolerated   Pertinent Vitals/Pain     Mobility  Bed Mobility Bed Mobility: Supine to Sit;Sit to Supine Supine to Sit: 4: Min assist Sit to Supine: 4: Min assist Details for Bed Mobility Assistance: A for R LE onto/ off bed for donning of KI. Pt was already sitting EOB without KI (unable to don KI in sitting) Transfers Transfers: Sit to Stand;Stand to Sit Sit to Stand: 4: Min assist Stand to Sit: 4: Min guard Details for Transfer Assistance: VCs safety, hand placement. Assist to rise, stabilize. Ambulation/Gait Ambulation Distance (Feet): 100 Feet Assistive device: Rolling walker Ambulation/Gait Assistance Details: VCs posture. R LE/foot with ER> L Gait Pattern: Step-to pattern;Trunk flexed;Decreased step length - right;Decreased stride length    Exercises     PT Diagnosis:    PT Problem List:   PT Treatment Interventions:     PT Goals Acute Rehab PT Goals PT Goal: Sit to Supine/Side - Progress: Progressing toward goal PT Goal: Sit to Stand - Progress: Progressing toward goal PT Goal: Stand to Sit - Progress: Progressing toward goal PT Goal: Ambulate - Progress: Progressing toward goal  Visit  Information  Last PT Received On: 01/25/12 Assistance Needed: +1    Subjective Data  Subjective: "It has to get better" Patient Stated Goal: Get better   Cognition  Overall Cognitive Status: Appears within functional limits for tasks assessed/performed Arousal/Alertness: Awake/alert Orientation Level: Appears intact for tasks assessed Behavior During Session: [facility] for tasks performed    Balance  Balance Balance Assessed: Yes Dynamic Standing Balance Dynamic Standing - Level of Assistance: 4: Min assist;Other (comment) (to pull up underwear)  End of Session PT - End of Session Equipment Utilized During Treatment: Gait belt;Right knee immobilizer Activity Tolerance: Patient tolerated treatment well Patient left: in bed;with call bell/phone within reach;with family/visitor present    Rebeca Alert [facility] 01/25/2012, 10:21 AM [phone]

## 2012-01-25 NOTE — Progress Notes (Signed)
Inpatient Diabetes Program Recommendations  AACE/ADA: New Consensus Statement on Inpatient Glycemic Control (2009)  Target Ranges:  Prepandial:   less than 140 mg/dL      Peak postprandial:   less than 180 mg/dL (1-2 hours)      Critically ill patients:  140 - 180 mg/dL   Reason for Visit: Hypoglycema last evening  Inpatient Diabetes Program Recommendations Insulin - Basal: Decrease pm 70/30 to 10 units ac supper. Oral Agents: Would not use glyburide while pt is in hospital.    Note: While here, need adjust home dose of 70/30 and not use the glyburide due to unpredictable meal intake and long duration of action.

## 2012-01-25 NOTE — Progress Notes (Signed)
Physical Therapy Treatment Patient Details Name: [name] MRN: [id number] DOB: 05-24-1961 Today's Date: 01/25/2012 Time: 6295-2841 PT Time Calculation (min): 38 min  PT Assessment / Plan / Recommendation Comments on Treatment Session  Continuing to progress well. Some difficulty with R knee flex ROM. Continue to recommend rehab.     Follow Up Recommendations  Skilled nursing facility    Barriers to Discharge        Equipment Recommendations  Defer to next venue    Recommendations for Other Services    Frequency 7X/week   Plan Discharge plan remains appropriate    Precautions / Restrictions Precautions Precautions: Knee Required Braces or Orthoses: Knee Immobilizer - Right Knee Immobilizer - Right: Discontinue once straight leg raise with < 10 degree lag Restrictions Weight Bearing Restrictions: No RLE Weight Bearing: Weight bearing as tolerated   Pertinent Vitals/Pain     Mobility  Bed Mobility Bed Mobility: Supine to Sit;Sit to Supine Supine to Sit: Min assist;HOB elevated;With rails Sit to Supine: Min assist;HOB elevated;With rail Details for Bed Mobility Assistance: A for R LE onto/off bed.  Transfers Transfers: Sit to Stand;Stand to Sit Sit to Stand: 4: Min assist;From bed;With upper extremity assist Stand to Sit: 4: Min guard;To bed;With upper extremity assist Details for Transfer Assistance: VCs safety, hand placement. Assist to rise, stabilize. Ambulation/Gait Ambulation/Gait Assistance: 4: Min guard Ambulation Distance (Feet): 150 Feet Assistive device: Rolling walker Ambulation/Gait Assistance Details: VCs posture. Slow gait speed.  Gait Pattern: Step-to pattern    Exercises Total Joint Exercises Ankle Circles/Pumps: AROM;Both;10 reps;Supine Quad Sets: Strengthening;Right;10 reps;Supine Heel Slides: AAROM;Right;10 reps;Supine Hip ABduction/ADduction: AAROM;Strengthening;Right;10 reps;Supine Straight Leg Raises: AAROM;Strengthening;Right;10  reps;Supine   PT Diagnosis:    PT Problem List:   PT Treatment Interventions:     PT Goals Acute Rehab PT Goals PT Goal: Sit to Supine/Side - Progress: Progressing toward goal PT Goal: Sit to Stand - Progress: Progressing toward goal PT Goal: Stand to Sit - Progress: Progressing toward goal PT Goal: Ambulate - Progress: Progressing toward goal  Visit Information  Last PT Received On: 01/25/12 Assistance Needed: +1    Subjective Data  Subjective: "Independence is my motivator." Patient Stated Goal: Home   Cognition  Overall Cognitive Status: Appears within functional limits for tasks assessed/performed Arousal/Alertness: Awake/alert Orientation Level: Appears intact for tasks assessed Behavior During Session: [facility] for tasks performed    Balance     End of Session PT - End of Session Equipment Utilized During Treatment: Gait belt;Right knee immobilizer Activity Tolerance: Patient tolerated treatment well Patient left: with call bell/phone within reach;with family/visitor present    Rebeca Alert [facility] 01/25/2012, 4:03 PM [phone]

## 2012-01-25 NOTE — Discharge Summary (Signed)
Physician Discharge Summary  Patient ID: [name] MRN: [id number] DOB/AGE: 11/02/1960 51 y.o.  Admit date: 01/23/2012 Discharge date:  01/26/2012  Procedures:  Procedure(s) (LRB): TOTAL KNEE ARTHROPLASTY (Right)  Attending Physician:  Dr. Durene Romans   Admission Diagnoses: End-stage osteoarthritis of the right knee  Discharge Diagnoses:  Principal Problem:  *S/P right TKA Coronary artery disease with stents Hypertension Diabetes Hyperlipidemia   HPI: This is a 51 year old lady with a history of end-stage osteoarthritis of her right knee that has failed conservative management. After discussion of treatments, benefits, risks, and options, the patient is now scheduled for total knee arthroplasty of the right knee. Note that her history is complicated by history of MI x2, one present and one post stent. She is on Effient and aspirin, has clot prevention. We are currently awaiting recommendations from Dr. Clifton James regarding the timing of stopping her Effient and whether she will need bridging Lovenox and will contact the patient with that information prior to surgery. If she is maintained on her Effient until 3 days before surgery, then we will need to proceed with general  anesthesia and possible femoral nerve block with out a spinal anesthesia. Otherwise, surgery is to go ahead as scheduled. The surgery risks, benefits, and aftercare were discussed in detail with the patient, questions invited and answered. Note that she is planning on going to a nursing facility postoperatively, so she is not given any home medications and she will be put back on her aspirin and Effient postop for both cardiovascular protection and DVT prophylaxis. Note that her medical doctor is Dr. Mikeal Hawthorne and her cardiologist is Dr. Verne Carrow at [facility].   PCP: Alva Garnet., MD   Discharged Condition: good  Hospital Course:  Patient underwent the above stated procedure on  01/23/2012. Patient tolerated the procedure well and brought to the recovery room in good condition and subsequently to the floor.  POD #1 BP: 120/77 ; Pulse: 88 ; Temp: 98.2 F (36.8 C) ; Resp: 16  Pt's foley was removed, as well as the hemovac drain removed. IV was changed to a saline lock. Patient reports pain as mild, pain well controlled. No events throughout the night.  Neurovascular intact, dorsiflexion/plantar flexion intact, incision: dressing C/D/I, no cellulitis present and compartment soft.   LABS  Basename  01/24/12 0425   HGB  8.8  HCT  28.7   POD #2  BP: 113/72 ; Pulse: 93 ; Temp: 98.9 F (37.2 C) ; Resp: 16  Patient reports pain as moderate. But overall OK, no events, working on placement Neurovascular intact, dorsiflexion/plantar flexion intact, incision: dressing C/D/I, no cellulitis present and compartment soft.   LABS  Basename  01/25/12 0438   HGB  9.0  HCT  29.3   POD #3  BP: 98/64 ; Pulse: 93 ; Temp: 97.2 F (36.2 C) ; Resp: 16  Patient reports pain as mild, pain well controlled. No events throughout the night. Ready to be discharged to SNF. Neurovascular intact, dorsiflexion/plantar flexion intact, incision: dressing C/D/I, no cellulitis present and compartment soft.  LABS   No new labs   Discharge Exam: General appearance: alert, cooperative and no distress Extremities: Homans sign is negative, no sign of DVT, no edema, redness or tenderness in the calves or thighs and no ulcers, gangrene or trophic changes  Disposition:  SNF  with follow up in 2 weeks   Follow-up Information    Follow up with Shelda Pal, MD. Schedule an appointment as soon  as possible for a visit in 2 weeks.   Contact information:   [facility] [address], [address] [place] [address] [phone]          Discharge Orders    Future Orders Please Complete By Expires   Diet - low sodium heart healthy      Call MD / Call 911       Comments:   If you experience chest pain or shortness of breath, CALL 911 and be transported to the hospital emergency room.  If you develope a fever above 101 F, pus (white drainage) or increased drainage or redness at the wound, or calf pain, call your surgeon's office.   Discharge instructions      Comments:   Maintain surgical dressing for 8 days, then replace with gauze and tape. Keep the area dry and clean until follow up. Follow up in 2 weeks at [facility] Call with any questions or concerns.   Constipation Prevention      Comments:   Drink plenty of fluids.  Prune juice may be helpful.  You may use a stool softener, such as Colace (over the counter) 100 mg twice a day.  Use MiraLax (over the counter) for constipation as needed.   Increase activity slowly as tolerated      Driving restrictions      Comments:   No driving for 4 weeks   TED hose      Comments:   Use stockings (TED hose) for 2 weeks on both leg(s).  You may remove them at night for sleeping.   Change dressing      Comments:   Maintain surgical dressing for 8 days, then change the dressing daily with sterile 4 x 4 inch gauze dressing and tape. Keep the area dry and clean.      Current Discharge Medication List    START taking these medications   Details  diphenhydrAMINE (BENADRYL) 25 mg capsule Take 1 capsule (25 mg total) by mouth every 6 (six) hours as needed for itching, allergies or sleep. Qty: 30 capsule    docusate sodium 100 MG CAPS Take 100 mg by mouth 2 (two) times daily. Qty: 10 capsule    ferrous sulfate 325 (65 FE) MG tablet Take 1 tablet (325 mg total) by mouth 3 (three) times daily after meals.    HYDROcodone-acetaminophen (NORCO) 7.5-325 MG per tablet Take 1-2 tablets by mouth every 4 (four) hours. Qty: 120 tablet, Refills: 0    methocarbamol (ROBAXIN) 500 MG tablet Take 1 tablet (500 mg total) by mouth every 6 (six) hours as needed (muscle spasms). Qty: 50 tablet, Refills: 0      polyethylene glycol (MIRALAX / GLYCOLAX) packet Take 17 g by mouth 2 (two) times daily. Qty: 14 each      CONTINUE these medications which have NOT CHANGED   Details  aspirin 81 MG tablet Take 81 mg by mouth daily with breakfast.     carvedilol (COREG) 6.25 MG tablet Take 6.25 mg by mouth 2 (two) times daily with a meal.    glyBURIDE-metformin (GLUCOVANCE) 5-500 MG per tablet Take 2 tablets by mouth 2 (two) times daily with a meal.     HUMALOG MIX 75/25 KWIKPEN (75-25) 100 UNIT/ML SUSP 25-50 Units 2 (two) times daily with a meal. Sliding scale    omega-3 acid ethyl esters (LOVAZA) 1 G capsule Take 1 g by mouth 2 (two) times daily.     valsartan (DIOVAN) 80  MG tablet Take 80 mg by mouth at bedtime.     VICTOZA 18 MG/3ML SOLN Inject 1.8 mLs into the skin at bedtime.     B-D ULTRAFINE III SHORT PEN 31G X 8 MM MISC     ONE TOUCH ULTRA TEST test strip     prasugrel (EFFIENT) 10 MG TABS Take 10 mg by mouth daily with breakfast.     promethazine (PHENERGAN) 25 MG tablet Take 25 mg by mouth every 6 (six) hours as needed. For nausea      STOP taking these medications     cyclobenzaprine (FLEXERIL) 10 MG tablet Comments:  Reason for Stopping:       HYDROcodone-acetaminophen (NORCO) 5-325 MG per tablet Comments:  Reason for Stopping:       traMADol (ULTRAM) 50 MG tablet Comments:  Reason for Stopping:           Signed:  Anastasio Auerbach. Janise Gora   PAC  01/26/2012, 8:23 AM

## 2012-01-25 NOTE — Evaluation (Signed)
Occupational Therapy Evaluation Patient Details Name: [name] MRN: [id number] DOB: Aug 29, 1960 Today's Date: 01/25/2012 Time: 7564-3329 OT Time Calculation (min): 28 min  OT Assessment / Plan / Recommendation Clinical Impression  Pt is a 51 to s/p R TKA and displays decreased strength and functional mobility, increased pain and will benefit from skilled OT services to increase independence with ADL for next venue of care.     OT Assessment  Patient needs continued OT Services    Follow Up Recommendations  Skilled nursing facility    Barriers to Discharge      Equipment Recommendations  Defer to next venue    Recommendations for Other Services    Frequency  Min 1X/week    Precautions / Restrictions Precautions Precautions: Knee Required Braces or Orthoses: Knee Immobilizer - Right Knee Immobilizer - Right: Discontinue once straight leg raise with < 10 degree lag Restrictions Weight Bearing Restrictions: No        ADL  Eating/Feeding: Simulated;Independent Where Assessed - Eating/Feeding: Chair Grooming: Simulated;Wash/dry face;Set up Where Assessed - Grooming: Unsupported sitting Upper Body Bathing: Right arm;Chest;Performed;Left arm;Abdomen;Set up Where Assessed - Upper Body Bathing: Unsupported sitting Lower Body Bathing: Performed;Minimal assistance Where Assessed - Lower Body Bathing: Supported sit to stand Upper Body Dressing: Simulated;Set up Where Assessed - Upper Body Dressing: Unsupported sitting Lower Body Dressing: Performed;Minimal assistance;Other (comment);Moderate assistance (donned underwear and socks) Where Assessed - Lower Body Dressing: Sopported sit to stand Toilet Transfer: Simulated;Minimal assistance Toilet Transfer Method: Stand pivot;Other (comment) (from EOB after bath to chair) Toileting - Clothing Manipulation and Hygiene: Simulated;Minimal assistance Where Assessed - Glass blower/designer Manipulation and Hygiene: Standing Tub/Shower  Transfer Method: Not assessed Equipment Used: Rolling walker    OT Diagnosis: Generalized weakness  OT Problem List: Decreased strength;Decreased knowledge of use of DME or AE;Pain OT Treatment Interventions: Self-care/ADL training;Therapeutic activities;DME and/or AE instruction;Patient/family education   OT Goals Acute Rehab OT Goals OT Goal Formulation: With patient Time For Goal Achievement: 02/01/12 Potential to Achieve Goals: Good ADL Goals Pt Will Perform Grooming: with supervision;Standing at sink ADL Goal: Grooming - Progress: Goal set today Pt Will Perform Lower Body Bathing: with supervision;Sit to stand from chair;Sit to stand from bed ADL Goal: Lower Body Bathing - Progress: Goal set today Pt Will Perform Lower Body Dressing: with supervision;Sit to stand from chair;Sit to stand from bed ADL Goal: Lower Body Dressing - Progress: Goal set today Pt Will Transfer to Toilet: with supervision;Ambulation;with DME;3-in-1 ADL Goal: Toilet Transfer - Progress: Goal set today Pt Will Perform Toileting - Clothing Manipulation: with supervision;Standing ADL Goal: Toileting - Clothing Manipulation - Progress: Goal set today  Visit Information  Last OT Received On: 01/25/12 Assistance Needed: +1    Subjective Data  Subjective: i am going to rehab Patient Stated Goal: to be independent and return home   Prior Functioning  Home Living Lives With: Alone Type of Home: House Home Access: Stairs to enter Secretary/administrator of Steps: 1 Entrance Stairs-Rails: None Home Layout: One level Bathroom Shower/Tub: Engineer, manufacturing systems: Handicapped height Home Adaptive Equipment: Straight cane Prior Function Level of Independence: Independent Able to Take Stairs?: Yes Driving: Yes Vocation: Full time employment Communication Communication: No difficulties    Cognition  Overall Cognitive Status: Appears within functional limits for tasks  assessed/performed Arousal/Alertness: Awake/alert Orientation Level: Appears intact for tasks assessed Behavior During Session: [facility] for tasks performed    Extremity/Trunk Assessment Right Upper Extremity Assessment RUE ROM/Strength/Tone: [facility] for tasks assessed Left Upper Extremity Assessment  LUE ROM/Strength/Tone: WFL for tasks assessed   Mobility Transfers Transfers: Sit to Stand;Stand to Sit Sit to Stand: 4: Min assist;With upper extremity assist;From bed Stand to Sit: 4: Min assist;With upper extremity assist;To chair/3-in-1 Details for Transfer Assistance: min verbal cues for hand placement   Exercise    Balance Balance Balance Assessed: Yes Dynamic Standing Balance Dynamic Standing - Level of Assistance: 4: Min assist;Other (comment) (to pull up underwear)  End of Session OT - End of Session Activity Tolerance: Patient tolerated treatment well Patient left: in chair;with call bell/phone within reach   Lennox Laity [phone] 01/25/2012, 9:40 AM

## 2012-01-25 NOTE — Progress Notes (Signed)
Patient ID: [name], female   DOB: February 15, 1961, 51 y.o.   MRN: [id number] Subjective: 2 Days Post-Op Procedure(s) (LRB): TOTAL KNEE ARTHROPLASTY (Right)    Patient reports pain as moderate. But overall OK, no events, working on placement  Objective:   VITALS:   Filed Vitals:   01/25/12 0541  BP: 113/72  Pulse: 93  Temp: 98.9 F (37.2 C)  Resp: 16    Neurovascular intact Incision: dressing C/D/I  LABS  Basename 01/25/12 0438 01/24/12 0425  HGB 9.0* 8.8*  HCT 29.3* 28.7*  WBC 11.4* 9.2  PLT 279 266     Basename 01/25/12 0438 01/24/12 0425  NA 136 134*  K 3.2* 3.6  BUN 6 8  CREATININE 0.60 0.58  GLUCOSE 90 292*    No results found for this basename: LABPT:2,INR:2 in the last 72 hours   Assessment/Plan: 2 Days Post-Op Procedure(s) (LRB): TOTAL KNEE ARTHROPLASTY (Right)   Up with therapy Discharge to SNF pending SW efforts, hopefully tomorrow

## 2012-01-26 ENCOUNTER — Encounter (HOSPITAL_COMMUNITY): Payer: Self-pay | Admitting: Orthopedic Surgery

## 2012-01-26 LAB — GLUCOSE, CAPILLARY
Glucose-Capillary: 100 mg/dL — ABNORMAL HIGH (ref 70–99)
Glucose-Capillary: 114 mg/dL — ABNORMAL HIGH (ref 70–99)
Glucose-Capillary: 113 mg/dL — ABNORMAL HIGH (ref 70–99)

## 2012-01-26 NOTE — Progress Notes (Signed)
Occupational Therapy Treatment Patient Details Name: [name] MRN: [id number] DOB: 12-17-1960 Today's Date: 01/26/2012 Time: 0865-7846 OT Time Calculation (min): 21 min  OT Assessment / Plan / Recommendation Comments on Treatment Session Pt doing very well overall supervision for selfcare tasks worked on this am.  Should be a gret rehab candidate for short term SNF and then home alone.  Recommend continued OT to help increase functional levels to modified independent at SNF level.    Follow Up Recommendations  Skilled nursing facility       Equipment Recommendations  Defer to next venue       Frequency Min 2X/week   Plan Discharge plan remains appropriate    Precautions / Restrictions Precautions Precautions: Knee Required Braces or Orthoses: Knee Immobilizer - Right Knee Immobilizer - Right: Discontinue once straight leg raise with < 10 degree lag Restrictions Weight Bearing Restrictions: No RLE Weight Bearing: Weight bearing as tolerated   Pertinent Vitals/Pain Pain 4/10 with activity, meds given by schedule    ADL  Eating/Feeding: Performed;Independent Where Assessed - Eating/Feeding: Chair Grooming: Performed;Supervision/safety Where Assessed - Grooming: Unsupported standing Toilet Transfer: Performed;Supervision/safety Statistician Method: Stand pivot Acupuncturist: Raised toilet seat with arms (or 3-in-1 over toilet) Toileting - Clothing Manipulation and Hygiene: Performed;Supervision/safety Where Assessed - Toileting Clothing Manipulation and Hygiene: Sit to stand from 3-in-1 or toilet Equipment Used: Rolling walker Transfers/Ambulation Related to ADLs: Pt supervision for mobility to and from the bathroom using the RW. ADL Comments: Pt supervision for all grooming and toileitng tasks.  Plans to discharge to SNF for further rehab later today.      OT Goals ADL Goals ADL Goal: Grooming - Progress: Met ADL Goal: Toilet Transfer - Progress:  Met ADL Goal: Toileting - Clothing Manipulation - Progress: Met  Visit Information  Last OT Received On: 01/26/12    Subjective Data  Subjective: "I'm not having any pain right now." Patient Stated Goal: To go to [employer] later today.      Cognition  Overall Cognitive Status: Appears within functional limits for tasks assessed/performed Arousal/Alertness: Awake/alert Orientation Level: Appears intact for tasks assessed    Mobility Bed Mobility Bed Mobility: Supine to Sit Supine to Sit: 5: Supervision;HOB flat Transfers Transfers: Sit to Stand Sit to Stand: 5: Supervision;With upper extremity assist;From chair/3-in-1 Stand to Sit: 5: Supervision;To elevated surface;With armrests;To chair/3-in-1 (Min cues to reach back for surface sitting down on.)      Balance Balance Balance Assessed: Yes Static Standing Balance Static Standing - Balance Support: Right upper extremity supported;Left upper extremity supported Static Standing - Level of Assistance: 5: Stand by assistance Dynamic Standing Balance Dynamic Standing - Balance Support: Right upper extremity supported;Left upper extremity supported Dynamic Standing - Level of Assistance: 5: Stand by assistance  End of Session OT - End of Session Activity Tolerance: Patient tolerated treatment well Patient left: in chair;with call bell/phone within reach   [facility] OTR/L 01/26/2012, 8:45 AM Pager number [phone]

## 2012-01-26 NOTE — Progress Notes (Signed)
Physical Therapy Treatment Patient Details Name: [name] MRN: [id number] DOB: [birth year] Today's Date: 01/26/2012 Time: 1025-1050 PT Time Calculation (min): 25 min  PT Assessment / Plan / Recommendation Comments on Treatment Session  Continues to have some difficulty with R knee ROM however pt is mobilizing very well. D/c to rehab today.     Follow Up Recommendations  Skilled nursing facility    Barriers to Discharge        Equipment Recommendations  Defer to next venue    Recommendations for Other Services    Frequency 7X/week   Plan Discharge plan remains appropriate    Precautions / Restrictions Precautions Precautions: Knee Required Braces or Orthoses: Knee Immobilizer - Right Knee Immobilizer - Right: Discontinue once straight leg raise with < 10 degree lag Restrictions Weight Bearing Restrictions: No RLE Weight Bearing: Weight bearing as tolerated   Pertinent Vitals/Pain     Mobility  Bed Mobility Bed Mobility: Sit to Supine Supine to Sit: 4: Min assist;HOB flat;With rails Details for Bed Mobility Assistance: A for R LE onto bed.  Transfers Transfers: Sit to Stand;Stand to Sit Sit to Stand: 5: Supervision Stand to Sit: 5: Supervision Details for Transfer Assistance: VCs safety, hand placement.  Ambulation/Gait Ambulation/Gait Assistance: 4: Min guard Ambulation Distance (Feet): 150 Feet Assistive device: Rolling walker Ambulation/Gait Assistance Details: VCs posture. Slow gait speed. R foot externally rotated (pt reports her feet have always turned out) Gait Pattern: Step-to pattern    Exercises Total Joint Exercises Ankle Circles/Pumps: AROM;Both;Supine Quad Sets: AROM;Strengthening;Right;10 reps;Supine Heel Slides: AAROM;Right;10 reps;Supine Hip ABduction/ADduction: AAROM;10 reps;Right;Supine Straight Leg Raises: AAROM;Right;10 reps;Supine   PT Diagnosis:    PT Problem List:   PT Treatment Interventions:     PT Goals Acute Rehab PT Goals PT  Goal: Sit to Supine/Side - Progress: Progressing toward goal PT Goal: Sit to Stand - Progress: Progressing toward goal PT Goal: Stand to Sit - Progress: Progressing toward goal PT Goal: Ambulate - Progress: Progressing toward goal  Visit Information  Last PT Received On: 01/26/12 Assistance Needed: +1    Subjective Data  Subjective: "I'm leaving today" Patient Stated Goal: Get better and back home   Cognition  Overall Cognitive Status: Appears within functional limits for tasks assessed/performed Arousal/Alertness: Awake/alert Orientation Level: Appears intact for tasks assessed Behavior During Session: [facility] for tasks performed    Balance    End of Session PT - End of Session Equipment Utilized During Treatment: Right knee immobilizer Activity Tolerance: Patient tolerated treatment well Patient left: in bed;with call bell/phone within reach    Rebeca Alert [facility] 01/26/2012, 10:57 AM [phone]

## 2012-01-26 NOTE — Progress Notes (Signed)
  Subjective: 3 Days Post-Op Procedure(s) (LRB): TOTAL KNEE ARTHROPLASTY (Right)   Patient reports pain as mild, pain well controlled. No events throughout the night. Ready to be discharged to SNF.  Objective:   VITALS:   Filed Vitals:   01/26/12 0628  BP: 98/64  Pulse: 93  Temp: 97.2 F (36.2 C)  Resp: 16    Neurovascular intact Dorsiflexion/Plantar flexion intact Incision: dressing C/D/I No cellulitis present Compartment soft  LABS  Basename 01/25/12 0438 01/24/12 0425  HGB 9.0* 8.8*  HCT 29.3* 28.7*  WBC 11.4* 9.2  PLT 279 266     Basename 01/25/12 0438 01/24/12 0425  NA 136 134*  K 3.2* 3.6  BUN 6 8  CREATININE 0.60 0.58  GLUCOSE 90 292*     Assessment/Plan: 3 Days Post-Op Procedure(s) (LRB): TOTAL KNEE ARTHROPLASTY (Right)   Up with therapy Discharge to SNF Follow up in 2 weeks at [facility].  Follow-up Information    Follow up with OLIN,Adedamola Seto D in 2 weeks.   Contact information:   [facility] [address], [address] [place] [address] [phone]          Anastasio Auerbach. Franca Stakes   PAC  01/26/2012, 8:20 AM

## 2012-01-29 NOTE — Progress Notes (Signed)
Clinical Social Work Department CLINICAL SOCIAL WORK PLACEMENT NOTE 01/29/2012  Patient:  [name], [name]  Account Number:  [id number] Admit date:  01/23/2012  Clinical Social Worker:  Cori Razor, LCSW  Date/time:  01/24/2012 04:12 PM  Clinical Social Work is seeking post-discharge placement for this patient at the following level of care:   SKILLED NURSING   (*CSW will update this form in Epic as items are completed)   01/24/2012  Patient/family provided with [facility] Department of Clinical Social Work's list of facilities offering this level of care within the geographic area requested by the patient (or if unable, by the patient's family).  01/24/2012  Patient/family informed of their freedom to choose among providers that offer the needed level of care, that participate in Medicare, Medicaid or managed care program needed by the patient, have an available bed and are willing to accept the patient.  01/24/2012  Patient/family informed of [facility]' ownership interest in [facility], as well as of the fact that they are under no obligation to receive care at this facility.  PASARR submitted to EDS on 01/24/2012 PASARR number received from EDS on 01/24/2012  FL2 transmitted to all facilities in geographic area requested by pt/family on  01/24/2012 FL2 transmitted to all facilities within larger geographic area on   Patient informed that his/her managed care company has contracts with or will negotiate with  certain facilities, including the following:     Patient/family informed of bed offers received:  01/24/2012 Patient chooses bed at [facility] Physician recommends and patient chooses bed at    Patient to be transferred to [facility] on  01/26/2012 Patient to be transferred to facility by [facility]  The following physician request were entered in Epic:   Additional Comments:  Cori Razor LCSW [phone]

## 2012-04-14 ENCOUNTER — Other Ambulatory Visit: Payer: Self-pay | Admitting: Cardiovascular Disease

## 2012-09-23 ENCOUNTER — Other Ambulatory Visit: Payer: Self-pay | Admitting: Internal Medicine

## 2012-09-23 DIAGNOSIS — R921 Mammographic calcification found on diagnostic imaging of breast: Secondary | ICD-10-CM

## 2012-10-01 ENCOUNTER — Ambulatory Visit
Admission: RE | Admit: 2012-10-01 | Discharge: 2012-10-01 | Disposition: A | Discharge status: Home / Self Care | Payer: BC Managed Care – PPO | Source: Ambulatory Visit | Attending: Internal Medicine | Admitting: Internal Medicine

## 2012-10-01 DIAGNOSIS — R921 Mammographic calcification found on diagnostic imaging of breast: Secondary | ICD-10-CM

## 2013-07-21 IMAGING — CR DG CHEST 2V
2 series · 2 of 2 positions shown · non-contrast
Comparison: Chest x-ray 01/15/2009.

CLINICAL DATA: Preoperative evaluation for total knee arthroplasty.

CHEST - 2 VIEW

[w chest pa]
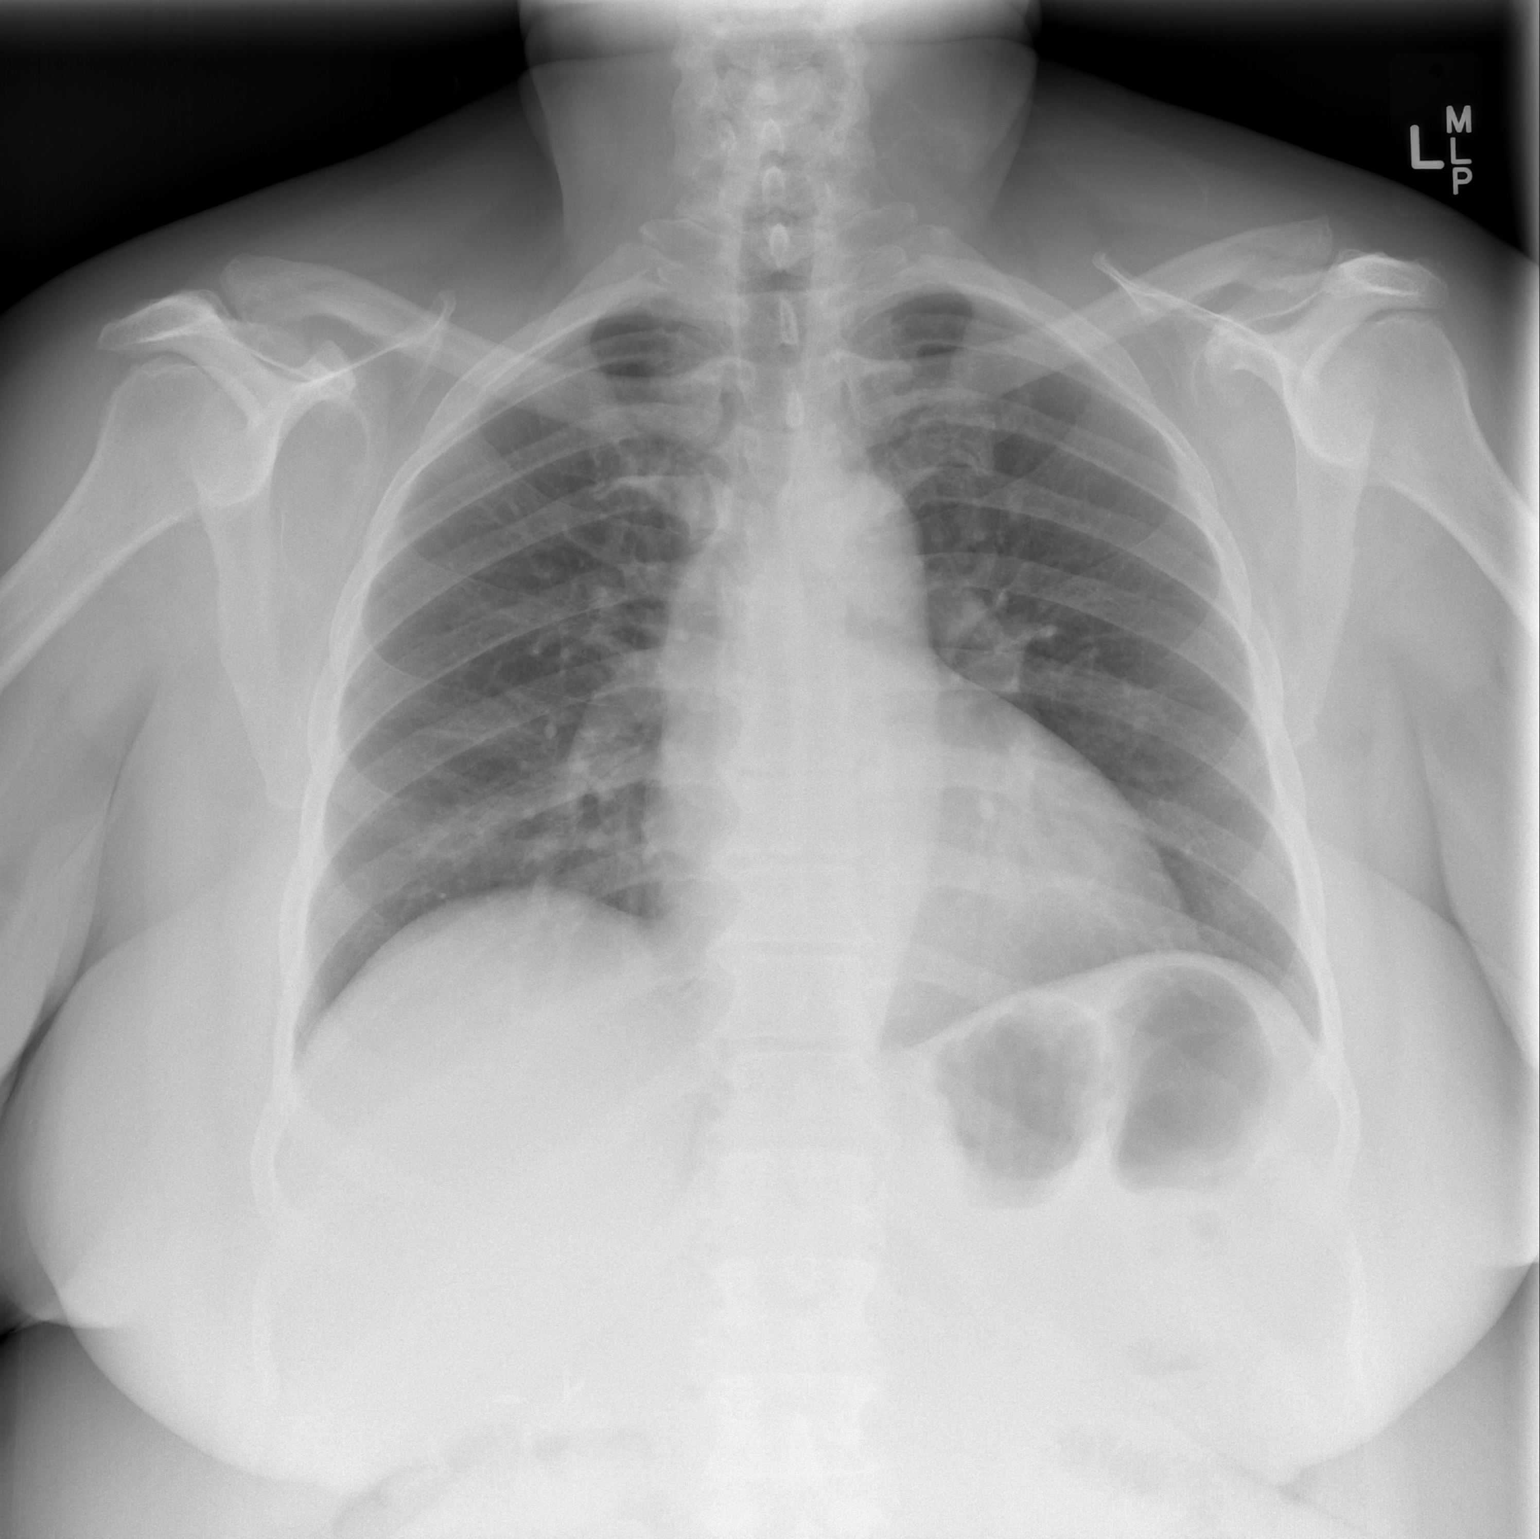

[w chest lat]
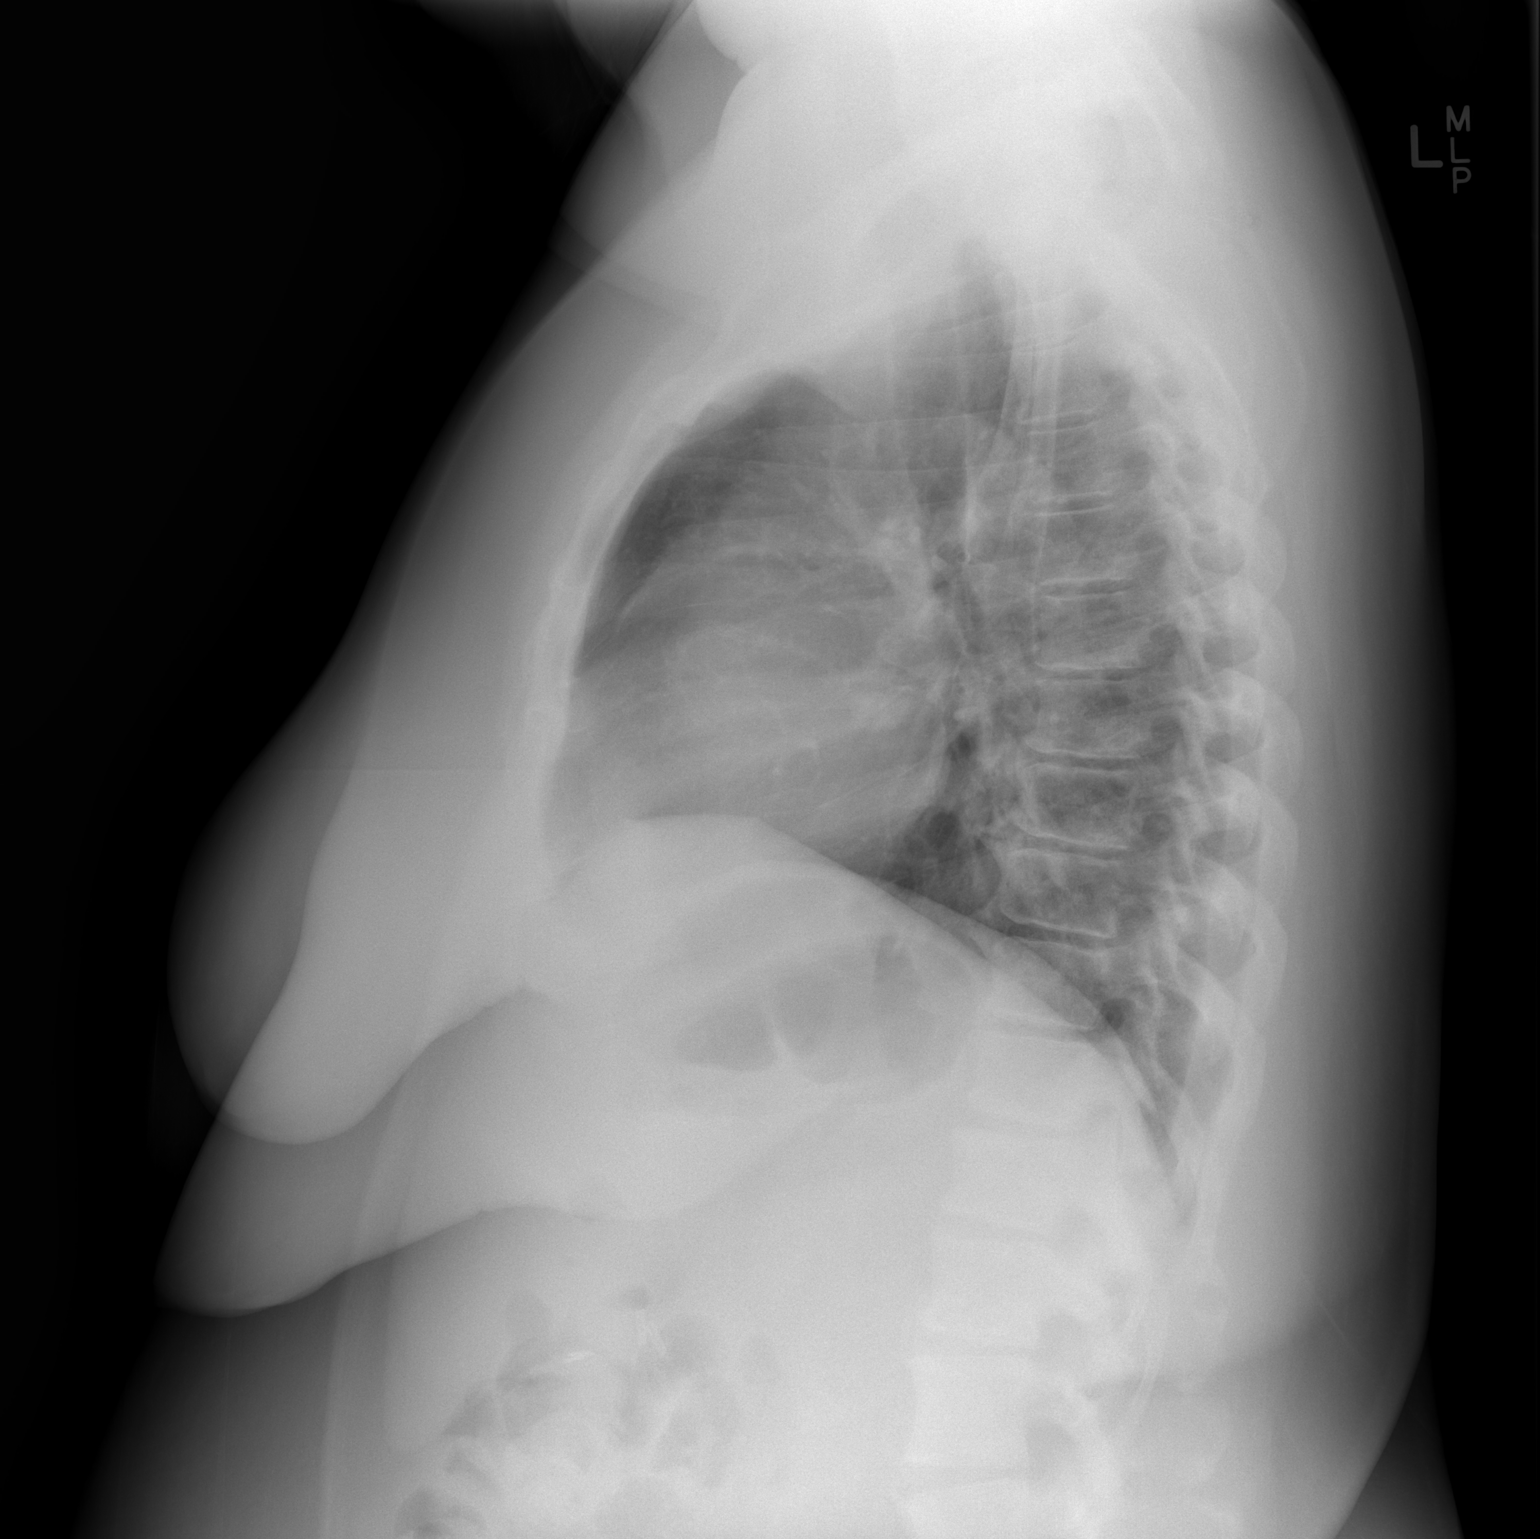

[2 of 2 positions shown; findings below may reference images not displayed]

FINDINGS: Lung volumes are normal.  No consolidative airspace
disease.  No pleural effusions.  No pneumothorax.  No pulmonary
nodule or mass noted.  Pulmonary vasculature and the
cardiomediastinal silhouette are within normal limits.   Surgical
clips project over the right upper quadrant of the abdomen, likely
from prior cholecystectomy.
IMPRESSION: 1. No radiographic evidence of acute cardiopulmonary disease.

## 2013-12-05 ENCOUNTER — Ambulatory Visit (INDEPENDENT_AMBULATORY_CARE_PROVIDER_SITE_OTHER): Payer: BC Managed Care – PPO | Admitting: Cardiovascular Disease

## 2013-12-05 ENCOUNTER — Encounter: Payer: Self-pay | Admitting: Cardiovascular Disease

## 2013-12-05 VITALS — BP 122/78 | HR 65 | Resp 12 | Wt 241.0 lb

## 2013-12-05 DIAGNOSIS — I2510 Atherosclerotic heart disease of native coronary artery without angina pectoris: Secondary | ICD-10-CM

## 2013-12-05 DIAGNOSIS — E785 Hyperlipidemia, unspecified: Secondary | ICD-10-CM

## 2013-12-05 DIAGNOSIS — I10 Essential (primary) hypertension: Secondary | ICD-10-CM

## 2013-12-05 MED ORDER — CARVEDILOL 6.25 MG PO TABS: 6.2500 mg | ORAL_TABLET | Freq: Two times a day (BID) | ORAL | Status: DC

## 2013-12-05 NOTE — Patient Instructions (Signed)
Your physician wants you to follow-up in:  12 months. You will receive a reminder letter in the mail two months in advance. If you don't receive a letter, please call our office to schedule the follow-up appointment.  Your physician has requested that you have a lexiscan myoview. For further information please visit [email or web address]. Please follow instruction sheet, as given.    

## 2013-12-05 NOTE — Progress Notes (Signed)
History of Present Illness: 53 yo female with history of hyperlipidemia, diabetes mellitus, and coronary artery disease status post placement of a Taxus drug-eluting stent in the proximal LAD in September 2007 who is here today for follow up. She was admitted to [facility] 01/14/09 with an acute anterior STEMI and was found to have very late thrombosis of the Taxus stent in the proximal LAD. Dr. Excell Seltzerooper treated her emergently with balloon angioplasty, no further stents were placed. LHC 6/10: ant apical AK, Ef 40%, dLM 20-30%, pLAD occluded at prox edge of stent (treated with PCI), R-L collats, pRCA 50%, mRCA 30%. Last echo 7/10: inf septal and apical HK, EF 45-50%, mild LAE. She was seen in follow up 07/12/11 with chest pain and palpitations. Myoview done 07/19/11: mod ant apical and inf apical scar, no ischemia, EF 45%. This was felt to be stable and med Rx was continued. Knee replacement 2013 and she did well. She is intolerant of statins.   She is here today for folllow up. The patient states she has been having some chest pains. This feels like reflux. Worse after meals. No shortness of breath, syncope, palpitations, orthopnea, PND or significant pedal edema.   Primary Care Physician: Andi DevonKimberly Shelton  Last Lipid Profile: followed in primary care.    Past Medical History  Diagnosis Date  . CAD (coronary artery disease)     s/p Taxus DES to pLAD 9/07; ant STEMI 6/10 due to late stent thrombosis treated with thrombectomy and POBA to LAD (LHC: ant apical AK, Ef 40%, dLM 20-30%, pLAD occluded at prox edge of stent (treated with PCI), R-L collats, pRCA 50%, mRCA 30%.) ;Myoview done 07/19/11: mod ant apical and inf apical scar, no ischemia, EF 45%    . Hyperlipidemia   . Hypertension   . Diabetes mellitus   . Arthritis   . Ischemic cardiomyopathy     echo 7/10: inf septal and apical HK, EF 45-50%, mild LAE.    Marland Kitchen. Myocardial infarction 2007,2010    Past Surgical History  Procedure  Laterality Date  . Cholecystectomy    . Tonsillectomy    . Coronary angioplasty  2007,2010  . Tonsillectomy      age 53  . Total knee arthroplasty  01/23/2012    Procedure: TOTAL KNEE ARTHROPLASTY;  Surgeon: Shelda PalMatthew D Olin, MD;  Location: [facility];  Service: Orthopedics;  Laterality: Right;    Current Outpatient Prescriptions  Medication Sig Dispense Refill  . aspirin 81 MG tablet Take 81 mg by mouth daily with breakfast.       . carvedilol (COREG) 6.25 MG tablet Take 1 tablet (6.25 mg total) by mouth 2 (two) times daily with a meal.  30 tablet  11  . Cyclobenzaprine HCl (FLEXERIL PO) Take by mouth. As needed      . glyBURIDE-metformin (GLUCOVANCE) 5-500 MG per tablet Take 2 tablets by mouth 2 (two) times daily with a meal.       . insulin aspart protamine- aspart (NOVOLOG MIX 70/30) (70-30) 100 UNIT/ML injection Inject 25-50 Units into the skin 2 (two) times daily with a meal.      . ONE TOUCH ULTRA TEST test strip       . prasugrel (EFFIENT) 10 MG TABS Take 10 mg by mouth daily with breakfast.       . traMADol (ULTRAM) 50 MG tablet Take 50 mg by mouth every 6 (six) hours as needed.      . valsartan (DIOVAN) 80  MG tablet Take 80 mg by mouth at bedtime.       Marland Kitchen. VICTOZA 18 MG/3ML SOLN Inject 1.8 mLs into the skin at bedtime.       . B-D ULTRAFINE III SHORT PEN 31G X 8 MM MISC       . diphenhydrAMINE (BENADRYL) 25 mg capsule Take 1 capsule (25 mg total) by mouth every 6 (six) hours as needed for itching, allergies or sleep.  30 capsule    . ferrous sulfate 325 (65 FE) MG tablet Take 1 tablet (325 mg total) by mouth 3 (three) times daily after meals.       No current facility-administered medications for this visit.    No Known Allergies  History   Social History  . Marital Status: Single    Spouse Name: N/A    Number of Children: N/A  . Years of Education: N/A   Occupational History  . Not on file.   Social History Main Topics  . Smoking status: Former Smoker -- 15 years     Types: Cigarettes  . Smokeless tobacco: Former NeurosurgeonUser    Quit date: 01/18/1991     Comment: QUIT 21 YEARS AGO  . Alcohol Use: No  . Drug Use: No  . Sexual Activity: Not on file   Other Topics Concern  . Not on file   Social History Narrative  . No narrative on file    Family History  Problem Relation Age of Onset  . Arthritis Other   . Colon cancer Other   . Diabetes Other     Review of Systems:  As stated in the HPI and otherwise negative.   BP 122/78  Pulse 65  Resp 12  Wt 241 lb (109.317 kg)  Physical Examination: General: Well developed, well nourished, NAD HEENT: OP clear, mucus membranes moist SKIN: warm, dry. No rashes. Neuro: No focal deficits Musculoskeletal: Muscle strength 5/5 all ext Psychiatric: Mood and affect normal Neck: No JVD, no carotid bruits, no thyromegaly, no lymphadenopathy. Lungs:Clear bilaterally, no wheezes, rhonci, crackles Cardiovascular: Regular rate and rhythm. No murmurs, gallops or rubs. Abdomen:Soft. Bowel sounds present. Non-tender.  Extremities: No lower extremity edema. Pulses are 2 + in the bilateral DP/PT.  EKG: NSR, rate 73 bpm. Old septal infarct. Non-specific ST and T wave abnormalities.   Assessment and Plan:   1. CAD: She is having chest pains but at rest. With known CAD, will arrange Lexiscan stress myoview. She cannot walk on the treadmill due to joint pain. Continue current meds.   2. HTN: BP well controlled. No changes.   3. HLD: Statin intolerant. Lipids followed in primary care.

## 2013-12-24 ENCOUNTER — Encounter: Payer: Self-pay | Admitting: Cardiology

## 2013-12-30 ENCOUNTER — Ambulatory Visit (HOSPITAL_COMMUNITY): Payer: BC Managed Care – PPO | Attending: Cardiovascular Disease | Admitting: Radiology

## 2013-12-30 VITALS — BP 110/62 | Ht 64.0 in | Wt 245.0 lb

## 2013-12-30 DIAGNOSIS — I2510 Atherosclerotic heart disease of native coronary artery without angina pectoris: Secondary | ICD-10-CM

## 2013-12-30 DIAGNOSIS — R079 Chest pain, unspecified: Secondary | ICD-10-CM | POA: Insufficient documentation

## 2013-12-30 MED ORDER — TECHNETIUM TC 99M SESTAMIBI GENERIC - CARDIOLITE
33.0000 | Freq: Once | INTRAVENOUS | Status: AC | PRN
  Administered 2013-12-30: 33 via INTRAVENOUS

## 2013-12-30 MED ORDER — REGADENOSON 0.4 MG/5ML IV SOLN
0.4000 mg | Freq: Once | INTRAVENOUS | Status: AC
  Administered 2013-12-30: 0.4 mg via INTRAVENOUS

## 2013-12-30 NOTE — Progress Notes (Signed)
[facility] SITE 3 NUCLEAR MED [address] [place], [place] [address] [phone]    Cardiology Nuclear Med Study  [name] is a 53 y.o. female     MRN : [id number]     DOB: 07-14-1961  Procedure Date: 12/30/2013  Nuclear Med Background Indication for Stress Test:  Evaluation for Ischemia, Stent Patency and Abnormal EKG History:  CAD-MI-CATH-STENT-PTCA-7/10 ECHO: EF: 45-50%, '12 MPI: EF: 45% ant apical inferior scar Cardiac Risk Factors: History of Smoking, Hypertension, IDDM, and Lipids  Symptoms:  Chest Pain   Nuclear Pre-Procedure Caffeine/Decaff Intake:  None> 12 hrs NPO After: 7:30pm   Lungs:  clear O2 Sat: 96% on room air. IV 0.9% NS with Angio Cath:  22g  IV Site: R Wrist x 1, tolerated well IV Started by:  Irean Hong, RN  Chest Size (in):  42 Cup Size: C  Height: 5\' 4"  (1.626 m)  Weight:  245 lb (111.131 kg)  BMI:  Body mass index is 42.03 kg/(m^2). Tech Comments:  No medications (Insulin or Coreg) this am. 1/2 dose insulin last night. Coreg taken last night. Fasting CBG was 184 at 0700 this am. Irean Hong, RN.    Nuclear Med Study 1 or 2 day study: 2 day  Stress Test Type:  Eugenie Birks  Reading MD: N/A  Order Authorizing Provider:  Verne Carrow, MD  Resting Radionuclide: Technetium 21m Sestamibi  Resting Radionuclide Dose: 33.0 mCi  12/2713  Stress Radionuclide:  Technetium 30m Sestamibi  Stress Radionuclide Dose: 33.0 mCi  12/30/13          Stress Protocol Rest HR: 73 Stress HR: 103  Rest BP: 110/62 Stress BP:102/50  Exercise Time (min): n/a METS: n/a   Predicted Max HR: 165 bpm % Max HR: 62.42 bpm Rate Pressure Product: 24097   Dose of Adenosine (mg):  n/a Dose of Lexiscan: 0.4 mg  Dose of Atropine (mg): n/a Dose of Dobutamine: n/a mcg/kg/min (at max HR)  Stress Test Technologist: Milana Na, EMT-P  Nuclear Technologist:  Harlow Asa, CNMT     Rest Procedure:  Myocardial perfusion imaging was performed at rest 45  minutes following the intravenous administration of Technetium 44m Sestamibi. Rest ECG: SR, Q in the anterior leads  Stress Procedure:  The patient received IV Lexiscan 0.4 mg over 15-seconds.  Technetium 25m Sestamibi injected at 30-seconds. This patient had sob, tightness in her neck and a headache with the Lexiscan injection. Quantitative spect images were obtained after a 45 minute delay. Stress ECG: No significant change from baseline ECG  QPS Raw Data Images:  Normal; no motion artifact; normal heart/lung ratio. Stress Images:  Decreased uptake in the mid anteroseptal, apical anterior wall and in the true apex as well as in the apical lateral and mid inferolateral walls.  Rest Images:  Decreased uptake in the mid anteroseptal, apical anterior wall and in the true apex as well as in the apical lateral and mid inferolateral walls.  Subtraction (SDS):  There are two defects: 1. Medium size, severe severity irreversible defect in the mid anteroseptal and apical anterior and in the true wall. 2. There is a small area, moderate severity irreversible defect inthe apical lateral and mid inferolateral walls.  Transient Ischemic Dilatation (Normal <1.22):  0.98 Lung/Heart Ratio (Normal <0.45):  0.23  Quantitative Gated Spect Images QGS EDV:  118 ml QGS ESV:  57 ml  Impression Exercise Capacity:  Lexiscan with no exercise. BP Response:  Normal blood pressure response. Clinical Symptoms:  There  is dyspnea. ECG Impression:  No significant ST segment change suggestive of ischemia. Comparison with Prior Nuclear Study: No images to compare  Overall Impression:  Intermediate risk stress nuclear study with small scars in the distal LAD and OM territory. No ischemia. .  LV Ejection Fraction: 52%.  LV Wall Motion:  Akinesis on the apical septal, lateral wall and in the true apex.   Lars MassonKatarina H Elfreda Blanchet 12/31/2013

## 2013-12-31 ENCOUNTER — Ambulatory Visit (HOSPITAL_COMMUNITY): Payer: BC Managed Care – PPO | Attending: Cardiovascular Disease

## 2013-12-31 DIAGNOSIS — R0989 Other specified symptoms and signs involving the circulatory and respiratory systems: Secondary | ICD-10-CM

## 2013-12-31 MED ORDER — TECHNETIUM TC 99M SESTAMIBI GENERIC - CARDIOLITE
33.0000 | Freq: Once | INTRAVENOUS | Status: AC | PRN
  Administered 2013-12-31: 33 via INTRAVENOUS

## 2014-06-03 ENCOUNTER — Telehealth: Payer: Self-pay | Admitting: Cardiovascular Disease

## 2014-06-03 NOTE — Telephone Encounter (Signed)
Spoke with pt. She is waiting for disability to be approved. Asking for samples. Effient 10 mg, 35 tablets, Lot [id number], exp. 8/16 and ASA 81 mg, 5 boxes of 32 tablets each, Lot [id number], exp 1/17 left at front desk for pt to pick up. She has been in touch with Effient assistance program.

## 2014-06-03 NOTE — Telephone Encounter (Signed)
New message    Patient need a call back regarding medication

## 2014-06-28 ENCOUNTER — Encounter (HOSPITAL_COMMUNITY): Payer: Self-pay | Admitting: Nurse Practitioner

## 2014-06-28 ENCOUNTER — Emergency Department (HOSPITAL_COMMUNITY)
Admission: EM | Admit: 2014-06-28 | Discharge: 2014-06-28 | Disposition: A | Discharge status: Home / Self Care | Payer: Self-pay | Attending: Emergency Medicine | Admitting: Emergency Medicine

## 2014-06-28 ENCOUNTER — Emergency Department (HOSPITAL_COMMUNITY): Payer: BC Managed Care – PPO

## 2014-06-28 DIAGNOSIS — I10 Essential (primary) hypertension: Secondary | ICD-10-CM | POA: Insufficient documentation

## 2014-06-28 DIAGNOSIS — Z79899 Other long term (current) drug therapy: Secondary | ICD-10-CM | POA: Insufficient documentation

## 2014-06-28 DIAGNOSIS — Z7982 Long term (current) use of aspirin: Secondary | ICD-10-CM | POA: Insufficient documentation

## 2014-06-28 DIAGNOSIS — Z87891 Personal history of nicotine dependence: Secondary | ICD-10-CM | POA: Insufficient documentation

## 2014-06-28 DIAGNOSIS — M1990 Unspecified osteoarthritis, unspecified site: Secondary | ICD-10-CM | POA: Insufficient documentation

## 2014-06-28 DIAGNOSIS — I2510 Atherosclerotic heart disease of native coronary artery without angina pectoris: Secondary | ICD-10-CM | POA: Insufficient documentation

## 2014-06-28 DIAGNOSIS — Z794 Long term (current) use of insulin: Secondary | ICD-10-CM | POA: Insufficient documentation

## 2014-06-28 DIAGNOSIS — I252 Old myocardial infarction: Secondary | ICD-10-CM | POA: Insufficient documentation

## 2014-06-28 DIAGNOSIS — R079 Chest pain, unspecified: Secondary | ICD-10-CM

## 2014-06-28 DIAGNOSIS — Z9861 Coronary angioplasty status: Secondary | ICD-10-CM | POA: Insufficient documentation

## 2014-06-28 DIAGNOSIS — Z8673 Personal history of transient ischemic attack (TIA), and cerebral infarction without residual deficits: Secondary | ICD-10-CM | POA: Insufficient documentation

## 2014-06-28 DIAGNOSIS — E119 Type 2 diabetes mellitus without complications: Secondary | ICD-10-CM | POA: Insufficient documentation

## 2014-06-28 DIAGNOSIS — R0789 Other chest pain: Secondary | ICD-10-CM | POA: Insufficient documentation

## 2014-06-28 LAB — CBC
HCT: 36.6 % (ref 36.0–46.0)
Hemoglobin: 11.2 g/dL — ABNORMAL LOW (ref 12.0–15.0)
MCH: 25.2 pg — ABNORMAL LOW (ref 26.0–34.0)
MCHC: 30.6 g/dL (ref 30.0–36.0)
MCV: 82.2 fL (ref 78.0–100.0)
Platelets: 381 10*3/uL (ref 150–400)
RBC: 4.45 MIL/uL (ref 3.87–5.11)
RDW: 14.8 % (ref 11.5–15.5)
WBC: 10.3 10*3/uL (ref 4.0–10.5)

## 2014-06-28 LAB — BASIC METABOLIC PANEL
Anion gap: 14 (ref 5–15)
BUN: 15 mg/dL (ref 6–23)
CO2: 21 mEq/L (ref 19–32)
Calcium: 8.9 mg/dL (ref 8.4–10.5)
Chloride: 101 mEq/L (ref 96–112)
Creatinine, Ser: 0.85 mg/dL (ref 0.50–1.10)
GFR calc Af Amer: 88 mL/min — ABNORMAL LOW (ref >90)
GFR calc non Af Amer: 76 mL/min — ABNORMAL LOW (ref >90)
Glucose, Bld: 224 mg/dL — ABNORMAL HIGH (ref 70–99)
Potassium: 3.8 mEq/L (ref 3.7–5.3)
Sodium: 136 mEq/L — ABNORMAL LOW (ref 137–147)

## 2014-06-28 LAB — HEPATIC FUNCTION PANEL
ALT: 10 U/L (ref 0–35)
AST: 14 U/L (ref 0–37)
Albumin: 3.1 g/dL — ABNORMAL LOW (ref 3.5–5.2)
Alkaline Phosphatase: 91 U/L (ref 39–117)
Bilirubin, Direct: <0.2 mg/dL (ref 0.0–0.3)
Total Bilirubin: <0.2 mg/dL — ABNORMAL LOW (ref 0.3–1.2)
Total Protein: 7.3 g/dL (ref 6.0–8.3)

## 2014-06-28 LAB — I-STAT TROPONIN, ED: Troponin i, poc: 0 ng/mL (ref 0.00–0.08)

## 2014-06-28 LAB — LIPASE, BLOOD: Lipase: 38 U/L (ref 11–59)

## 2014-06-28 LAB — PRO B NATRIURETIC PEPTIDE: Pro B Natriuretic peptide (BNP): 52.3 pg/mL (ref 0–125)

## 2014-06-28 MED ORDER — PANTOPRAZOLE SODIUM 40 MG PO TBEC
40.0000 mg | DELAYED_RELEASE_TABLET | Freq: Once | ORAL | Status: AC
  Administered 2014-06-28: 40 mg via ORAL
  Filled 2014-06-28: qty 1

## 2014-06-28 MED ORDER — GI COCKTAIL ~~LOC~~
30.0000 mL | Freq: Once | ORAL | Status: AC
  Administered 2014-06-28: 30 mL via ORAL
  Filled 2014-06-28: qty 30

## 2014-06-28 MED ORDER — OMEPRAZOLE 20 MG PO CPDR
20.0000 mg | DELAYED_RELEASE_CAPSULE | Freq: Every day | ORAL | Status: DC
Start: 2014-06-28 — End: 2015-03-17

## 2014-06-28 MED ORDER — FAMOTIDINE 20 MG PO TABS
20.0000 mg | ORAL_TABLET | Freq: Once | ORAL | Status: AC
  Administered 2014-06-28: 20 mg via ORAL
  Filled 2014-06-28: qty 1

## 2014-06-28 NOTE — Discharge Instructions (Signed)

## 2014-06-28 NOTE — ED Provider Notes (Signed)
CSN: [id number]     Arrival date & time 06/28/14  1525 History   First MD Initiated Contact with Patient 06/28/14 1547     Chief Complaint  Patient presents with  . Chest Pain     (Consider location/radiation/quality/duration/timing/severity/associated sxs/prior Treatment) HPI 53 year old female with history of coronary artery disease, prior MI, stent, presents with a one-week history of daily burning chest pains with sour taste in the back of her throat belching and a positional pain, worse when she first wakes up and not any worse when she is walking around with no change with eating but improvement without resolution with antacids over-the-counter, her pains are mild burning sensations lasting for over 6-8 hours at a time all day everyday for the last week with today's episode present for approximately 11 hours at least now with no cough no shortness of breath no radiation no abdominal pain no back pain her pain is felt as a burning sensation throughout her anterior chest without associated symptoms and her sensation feels different than her prior heart attack type pains. Past Medical History  Diagnosis Date  . CAD (coronary artery disease)     s/p Taxus DES to pLAD 9/07; ant STEMI 6/10 due to late stent thrombosis treated with thrombectomy and POBA to LAD (LHC: ant apical AK, Ef 40%, dLM 20-30%, pLAD occluded at prox edge of stent (treated with PCI), R-L collats, pRCA 50%, mRCA 30%.) ;Myoview done 07/19/11: mod ant apical and inf apical scar, no ischemia, EF 45%    . Hyperlipidemia   . Hypertension   . Diabetes mellitus   . Arthritis   . Ischemic cardiomyopathy     echo 7/10: inf septal and apical HK, EF 45-50%, mild LAE.    Marland Kitchen Myocardial infarction 2007,2010   Past Surgical History  Procedure Laterality Date  . Cholecystectomy    . Tonsillectomy    . Coronary angioplasty  2007,2010  . Tonsillectomy      age 56  . Total knee arthroplasty  01/23/2012    Procedure: TOTAL KNEE  ARTHROPLASTY;  Surgeon: Shelda Pal, MD;  Location: [facility];  Service: Orthopedics;  Laterality: Right;   Family History  Problem Relation Age of Onset  . Arthritis Other   . Colon cancer Other   . Diabetes Other    History  Substance Use Topics  . Smoking status: Former Smoker -- 15 years    Types: Cigarettes  . Smokeless tobacco: Former Neurosurgeon    Quit date: 01/18/1991     Comment: QUIT 21 YEARS AGO  . Alcohol Use: No   OB History    No data available     Review of Systems 10 Systems reviewed and are negative for acute change except as noted in the HPI.   Allergies  Review of patient's allergies indicates no known allergies.  Home Medications   Prior to Admission medications   Medication Sig Start Date End Date Taking? Authorizing Provider  aspirin 81 MG tablet Take 81 mg by mouth daily with breakfast.     Historical Provider, MD  B-D ULTRAFINE III SHORT PEN 31G X 8 MM MISC  06/13/11   Historical Provider, MD  carvedilol (COREG) 6.25 MG tablet Take 1 tablet (6.25 mg total) by mouth 2 (two) times daily with a meal. 12/05/13   Kathleene Hazel, MD  Cyclobenzaprine HCl (FLEXERIL PO) Take by mouth. As needed    Historical Provider, MD  diphenhydrAMINE (BENADRYL) 25 mg capsule Take 1 capsule (25 mg total)  by mouth every 6 (six) hours as needed for itching, allergies or sleep. 01/25/12 02/04/12  Genelle GatherMatthew Scott Babish, PA-C  ferrous sulfate 325 (65 FE) MG tablet Take 1 tablet (325 mg total) by mouth 3 (three) times daily after meals. 01/25/12 01/24/13  Genelle GatherMatthew Scott Babish, PA-C  glyBURIDE-metformin (GLUCOVANCE) 5-500 MG per tablet Take 2 tablets by mouth 2 (two) times daily with a meal.     Historical Provider, MD  insulin aspart protamine- aspart (NOVOLOG MIX 70/30) (70-30) 100 UNIT/ML injection Inject 25-50 Units into the skin 2 (two) times daily with a meal.    Historical Provider, MD  omeprazole (PRILOSEC) 20 MG capsule Take 1 capsule (20 mg total) by mouth daily. 06/28/14   Hurman HornJohn  M Calvina Liptak, MD  ONE TOUCH ULTRA TEST test strip  08/13/11   Historical Provider, MD  prasugrel (EFFIENT) 10 MG TABS Take 10 mg by mouth daily with breakfast.     Historical Provider, MD  traMADol (ULTRAM) 50 MG tablet Take 50 mg by mouth every 6 (six) hours as needed.    Historical Provider, MD  valsartan (DIOVAN) 80 MG tablet Take 80 mg by mouth at bedtime.     Historical Provider, MD  VICTOZA 18 MG/3ML SOLN Inject 1.8 mLs into the skin at bedtime.  07/27/11   Historical Provider, MD   BP 143/74 mmHg  Pulse 86  Resp 17  SpO2 97% Physical Exam  Constitutional:  Awake, alert, nontoxic appearance.  HENT:  Head: Atraumatic.  Eyes: Right eye exhibits no discharge. Left eye exhibits no discharge.  Neck: Neck supple.  Cardiovascular: Normal rate and regular rhythm.   No murmur heard. Pulmonary/Chest: Effort normal and breath sounds normal. No respiratory distress. She has no wheezes. She has no rales. She exhibits no tenderness.  Abdominal: Soft. Bowel sounds are normal. She exhibits no distension and no mass. There is no tenderness. There is no rebound and no guarding.  Musculoskeletal: She exhibits no edema or tenderness.  Baseline ROM, no obvious new focal weakness.  Neurological: She is alert.  Mental status and motor strength appears baseline for patient and situation.  Skin: No rash noted.  Psychiatric: She has a normal mood and affect.  Nursing note and vitals reviewed.   ED Course  Procedures (including critical care time) Improved with GI meds. Labs Review Labs Reviewed  CBC - Abnormal; Notable for the following:    Hemoglobin 11.2 (*)    MCH 25.2 (*)    All other components within normal limits  BASIC METABOLIC PANEL - Abnormal; Notable for the following:    Sodium 136 (*)    Glucose, Bld 224 (*)    GFR calc non Af Amer 76 (*)    GFR calc Af Amer 88 (*)    All other components within normal limits  HEPATIC FUNCTION PANEL - Abnormal; Notable for the following:    Albumin  3.1 (*)    Total Bilirubin <0.2 (*)    All other components within normal limits  PRO B NATRIURETIC PEPTIDE  LIPASE, BLOOD  I-STAT TROPOININ, ED    Imaging Review No results found.   EKG Interpretation   Date/Time:  Sunday June 28 2014 15:27:52 EST Ventricular Rate:  91 PR Interval:  154 QRS Duration: 80 QT Interval:  362 QTC Calculation: 445 R Axis:   -37 Text Interpretation:  Normal sinus rhythm Left axis deviation Septal  infarct , age undetermined Possible Lateral infarct , age undetermined No  significant change since last tracing Confirmed  by [facility]  MD, Jonny RuizJOHN  [phone]([id number]) on 06/28/2014 3:42:32 PM      MDM   Final diagnoses:  Chest pain, unspecified chest pain type    Patient informed of clinical course, understand medical decision-making process, and agree with plan. I doubt any other EMC precluding discharge at this time including, but not necessarily limited to the following:ACS.   Hurman HornJohn M Ansel Ferrall, MD 07/02/14 [phone]

## 2014-06-28 NOTE — ED Notes (Signed)
She c/o 'sharp" L sided chest pain intermittent for past several days. No noted activity at onset. Pain "comes and goes but Im not sure what causes it or makes it go away." she has had some nausea and SOB as well

## 2014-06-28 NOTE — ED Notes (Signed)
Patient transported to X-ray 

## 2015-03-17 ENCOUNTER — Ambulatory Visit (INDEPENDENT_AMBULATORY_CARE_PROVIDER_SITE_OTHER): Payer: BC Managed Care – PPO | Admitting: Cardiovascular Disease

## 2015-03-17 ENCOUNTER — Encounter: Payer: Self-pay | Admitting: Cardiovascular Disease

## 2015-03-17 VITALS — BP 110/68 | HR 81 | Ht 64.0 in | Wt 164.0 lb

## 2015-03-17 DIAGNOSIS — R531 Weakness: Secondary | ICD-10-CM

## 2015-03-17 DIAGNOSIS — I10 Essential (primary) hypertension: Secondary | ICD-10-CM | POA: Diagnosis not present

## 2015-03-17 DIAGNOSIS — E785 Hyperlipidemia, unspecified: Secondary | ICD-10-CM | POA: Diagnosis not present

## 2015-03-17 DIAGNOSIS — I2510 Atherosclerotic heart disease of native coronary artery without angina pectoris: Secondary | ICD-10-CM

## 2015-03-17 LAB — BASIC METABOLIC PANEL
BUN: 14 mg/dL (ref 6–23)
CO2: 28 mEq/L (ref 19–32)
Calcium: 9.3 mg/dL (ref 8.4–10.5)
Chloride: 106 mEq/L (ref 96–112)
Creatinine, Ser: 0.83 mg/dL (ref 0.40–1.20)
GFR: 91.27 mL/min (ref >60.00)
Glucose, Bld: 114 mg/dL — ABNORMAL HIGH (ref 70–99)
Potassium: 4.3 mEq/L (ref 3.5–5.1)
Sodium: 141 mEq/L (ref 135–145)

## 2015-03-17 MED ORDER — CLOPIDOGREL BISULFATE 75 MG PO TABS: 75.0000 mg | ORAL_TABLET | Freq: Every day | ORAL | Status: DC

## 2015-03-17 NOTE — Progress Notes (Signed)
Chief Complaint  Patient presents with  . Leg Swelling     History of Present Illness: 54 yo female with history of hyperlipidemia, diabetes mellitus, and coronary artery disease status post placement of a Taxus drug-eluting stent in the proximal LAD in September 2007 who is here today for follow up. She was admitted to [facility] 01/14/09 with an acute anterior STEMI and was found to have very late thrombosis of the Taxus stent in the proximal LAD. Dr. Excell Seltzer treated her emergently with balloon angioplasty, no further stents were placed. LHC 6/10: ant apical AK, Ef 40%, dLM 20-30%, pLAD occluded at prox edge of stent (treated with PCI), R-L collats, pRCA 50%, mRCA 30%. Last echo 7/10: inf septal and apical HK, EF 45-50%, mild LAE. She was seen in follow up 07/12/11 with chest pain and palpitations. Myoview done 07/19/11: mod ant apical and inf apical scar, no ischemia, EF 45%. This was felt to be stable and med Rx was continued. Knee replacement 2013 and she did well. She is intolerant of statins. Stress myoview May 2015 with no ischemia, LVEF= 52%.  She is here today for folllow up. No chest pain. Occasional SOB. No syncope, palpitations, orthopnea, PND or significant pedal edema. She has had some slurring of speech and wants me to check her potassium. She was unable to be seen in primary care this week so she decided to come here today to discuss her issues.   Primary Care Physician: [name]  Last Lipid Profile: followed in primary care.    Past Medical History  Diagnosis Date  . CAD (coronary artery disease)     s/p Taxus DES to pLAD 9/07; ant STEMI 6/10 due to late stent thrombosis treated with thrombectomy and POBA to LAD (LHC: ant apical AK, Ef 40%, dLM 20-30%, pLAD occluded at prox edge of stent (treated with PCI), R-L collats, pRCA 50%, mRCA 30%.) ;Myoview done 07/19/11: mod ant apical and inf apical scar, no ischemia, EF 45%    . Hyperlipidemia   . Hypertension   .  Diabetes mellitus   . Arthritis   . Ischemic cardiomyopathy     echo 7/10: inf septal and apical HK, EF 45-50%, mild LAE.    Marland Kitchen Myocardial infarction 2007,2010    Past Surgical History  Procedure Laterality Date  . Cholecystectomy    . Tonsillectomy    . Coronary angioplasty  2007,2010  . Tonsillectomy      age 40  . Total knee arthroplasty  01/23/2012    Procedure: TOTAL KNEE ARTHROPLASTY;  Surgeon: Shelda Pal, MD;  Location: [facility];  Service: Orthopedics;  Laterality: Right;    Current Outpatient Prescriptions  Medication Sig Dispense Refill  . aspirin 81 MG tablet Take 81 mg by mouth daily with breakfast.     . B-D INS SYR ULTRAFINE 1CC/31G 31G X 5/16" 1 ML MISC USE UP TO TWICE A DAY  11  . B-D ULTRAFINE III SHORT PEN 31G X 8 MM MISC     . carvedilol (COREG) 6.25 MG tablet Take 1 tablet (6.25 mg total) by mouth 2 (two) times daily with a meal. 60 tablet 11  . cyclobenzaprine (FLEXERIL) 10 MG tablet TAKE 1 TABLET TWICE A DAY AS NEEDED FOR MUSCLE STRAIN/SPASM  0  . glyBURIDE-metformin (GLUCOVANCE) 5-500 MG per tablet Take 2 tablets by mouth 2 (two) times daily with a meal.     . insulin aspart protamine- aspart (NOVOLOG MIX 70/30) (70-30) 100 UNIT/ML injection  Inject 25-50 Units into the skin 2 (two) times daily with a meal.    . NOVOLIN 70/30 (70-30) 100 UNIT/ML injection INJECT BY SUBCUTANEOUS ROUTE AS PER SLIDING SCALE  5  . ONE TOUCH ULTRA TEST test strip     . traMADol (ULTRAM) 50 MG tablet Take 50 mg by mouth every 6 (six) hours as needed.    . valsartan (DIOVAN) 80 MG tablet Take 80 mg by mouth at bedtime.     Marland Kitchen VICTOZA 18 MG/3ML SOLN Inject 1.8 mLs into the skin at bedtime.     . clopidogrel (PLAVIX) 75 MG tablet Take 1 tablet (75 mg total) by mouth daily. 90 tablet 3  . diphenhydrAMINE (BENADRYL) 25 mg capsule Take 1 capsule (25 mg total) by mouth every 6 (six) hours as needed for itching, allergies or sleep. 30 capsule   . ferrous sulfate 325 (65 FE) MG tablet Take 1  tablet (325 mg total) by mouth 3 (three) times daily after meals.     No current facility-administered medications for this visit.    No Known Allergies  Social History   Social History  . Marital Status: Single    Spouse Name: N/A  . Number of Children: N/A  . Years of Education: N/A   Occupational History  . Not on file.   Social History Main Topics  . Smoking status: Former Smoker -- 15 years    Types: Cigarettes  . Smokeless tobacco: Former Neurosurgeon    Quit date: 01/18/1991     Comment: QUIT 21 YEARS AGO  . Alcohol Use: No  . Drug Use: No  . Sexual Activity: Not on file   Other Topics Concern  . Not on file   Social History Narrative    Family History  Problem Relation Age of Onset  . Arthritis Other   . Colon cancer Other   . Diabetes Other   . Lupus Mother   . Diabetes Father   . Cancer Maternal Grandmother   . Heart disease Maternal Grandfather   . Cancer Paternal Grandmother     Review of Systems:  As stated in the HPI and otherwise negative.   BP 110/68 mmHg  Pulse 81  Ht 5\' 4"  (1.626 m)  Wt 164 lb (74.39 kg)  BMI 28.14 kg/m2  SpO2 99%  Physical Examination: General: Well developed, well nourished, NAD HEENT: OP clear, mucus membranes moist SKIN: warm, dry. No rashes. Neuro: No focal deficits Musculoskeletal: Muscle strength 5/5 all ext Psychiatric: Mood and affect normal Neck: No JVD, no carotid bruits, no thyromegaly, no lymphadenopathy. Lungs:Clear bilaterally, no wheezes, rhonci, crackles Cardiovascular: Regular rate and rhythm. No murmurs, gallops or rubs. Abdomen:Soft. Bowel sounds present. Non-tender.  Extremities: No lower extremity edema. Pulses are 2 + in the bilateral DP/PT.  Stress myoview May 2015: Stress Procedure: The patient received IV Lexiscan 0.4 mg over 15-seconds. Technetium 84m Sestamibi injected at 30-seconds. This patient had sob, tightness in her neck and a headache with the Lexiscan injection. Quantitative spect  images were obtained after a 45 minute delay. Stress ECG: No significant change from baseline ECG  QPS Raw Data Images: Normal; no motion artifact; normal heart/lung ratio. Stress Images: Decreased uptake in the mid anteroseptal, apical anterior wall and in the true apex as well as in the apical lateral and mid inferolateral walls.  Rest Images: Decreased uptake in the mid anteroseptal, apical anterior wall and in the true apex as well as in the apical lateral and mid inferolateral walls.  Subtraction (SDS): There are two defects: 1. Medium size, severe severity irreversible defect in the mid anteroseptal and apical anterior and in the true wall. 2. There is a small area, moderate severity irreversible defect inthe apical lateral and mid inferolateral walls.  Transient Ischemic Dilatation (Normal <1.22): 0.98 Lung/Heart Ratio (Normal <0.45): 0.23  Quantitative Gated Spect Images QGS EDV: 118 ml QGS ESV: 57 ml  Impression Exercise Capacity: Lexiscan with no exercise. BP Response: Normal blood pressure response. Clinical Symptoms: There is dyspnea. ECG Impression: No significant ST segment change suggestive of ischemia. Comparison with Prior Nuclear Study: No images to compare  Overall Impression: Intermediate risk stress nuclear study with small scars in the distal LAD and OM territory. No ischemia. .  LV Ejection Fraction: 52%. LV Wall Motion: Akinesis on the apical septal, lateral wall and in the true apex  EKG:  EKG is not ordered today. The ekg ordered today demonstrates   Lipids: Followed in primary care.   Recent Labs: 06/28/2014: ALT 10; BUN 15; Creatinine, Ser 0.85; Hemoglobin 11.2*; Platelets 381; Potassium 3.8; Pro B Natriuretic peptide (BNP) 52.3; Sodium 136*     Wt Readings from Last 3 Encounters:  03/17/15 164 lb (74.39 kg)  12/30/13 245 lb (111.131 kg)  12/05/13 241 lb (109.317 kg)     Other studies Reviewed: Additional studies/ records that were  reviewed today include: . Review of the above records demonstrates:   Assessment and Plan:   1. CAD: Stable. Stress myoview may 2015 with no ischemia. Continue current meds. Will change Effient to Plavix 75 mg daily (she was not on Plavix at time of stent thrombosis in 2010)  2. HTN: BP well controlled. No changes.   3. HLD: Statin intolerant. Lipids followed in primary care and well controlled per pt.   4. Weakness, slurred speech: Will check BMET today. No carotid bruits. No focal neurological findings.   Current medicines are reviewed at length with the patient today.  The patient does not have concerns regarding medicines.  The following changes have been made:  no change  Labs/ tests ordered today include:   Orders Placed This Encounter  Procedures  . Basic Metabolic Panel (BMET)     Disposition:   FU with me in 6 months   Signed, Verne Carrow, MD 03/17/2015 10:25 AM    [facility] [address] [place], [place], [place]  [address] Phone: [phone]; Fax: [phone]

## 2015-03-17 NOTE — Patient Instructions (Addendum)
Medication Instructions:  Your physician has recommended you make the following change in your medication:  1) STOP Effient 2) STOP Prilosec 3) START Plavix 75 mg tablet by mouth ONCE daily  Labwork: Your physician recommends that you return for lab work TODAY (BMET)   Testing/Procedures: NONE  Follow-Up: Your physician wants you to follow-up in: 6 months with DR. McAlhany. You will receive a reminder letter in the mail two months in advance. If you don't receive a letter, please call our office to schedule the follow-up appointment.   Any Other Special Instructions Will Be Listed Below (If Applicable).

## 2015-06-28 ENCOUNTER — Telehealth: Payer: Self-pay | Admitting: Cardiovascular Disease

## 2015-06-28 NOTE — Telephone Encounter (Signed)
Spoke with [name] and told her Dr. Clifton JamesMcAlhany had signed paperwork indicating pt could hold Plavix 5 days prior to procedure.  Fax number below confirmed.  Will fax paperwork.

## 2015-06-28 NOTE — Telephone Encounter (Signed)
New message    Request for surgical clearance:  1. What type of surgery is being performed? colonoscopy   2. When is this surgery scheduled? 11.30.2016   3. Are there any medications that need to be held prior to surgery and how long?Plavix 5 days prior   4. Name of physician performing surgery? Dr .Loreta AveMann  5. What is your office phone and fax number? [phone] / fax [phone]

## 2016-01-10 ENCOUNTER — Ambulatory Visit: Payer: BC Managed Care – PPO | Admitting: Cardiovascular Disease

## 2016-03-23 ENCOUNTER — Encounter: Payer: Self-pay | Admitting: Cardiovascular Disease

## 2016-03-27 ENCOUNTER — Other Ambulatory Visit: Payer: Self-pay | Admitting: Cardiovascular Disease

## 2016-03-27 DIAGNOSIS — I10 Essential (primary) hypertension: Secondary | ICD-10-CM

## 2016-03-27 DIAGNOSIS — I2510 Atherosclerotic heart disease of native coronary artery without angina pectoris: Secondary | ICD-10-CM

## 2016-04-06 ENCOUNTER — Ambulatory Visit (INDEPENDENT_AMBULATORY_CARE_PROVIDER_SITE_OTHER): Payer: BC Managed Care – PPO | Admitting: Cardiovascular Disease

## 2016-04-06 ENCOUNTER — Encounter (INDEPENDENT_AMBULATORY_CARE_PROVIDER_SITE_OTHER): Payer: Self-pay

## 2016-04-06 VITALS — BP 140/80 | HR 82 | Ht 64.0 in | Wt 240.8 lb

## 2016-04-06 DIAGNOSIS — I10 Essential (primary) hypertension: Secondary | ICD-10-CM | POA: Diagnosis not present

## 2016-04-06 DIAGNOSIS — I2510 Atherosclerotic heart disease of native coronary artery without angina pectoris: Secondary | ICD-10-CM | POA: Diagnosis not present

## 2016-04-06 DIAGNOSIS — E785 Hyperlipidemia, unspecified: Secondary | ICD-10-CM

## 2016-04-06 NOTE — Progress Notes (Signed)
Chief Complaint  Patient presents with  . Coronary Artery Disease     History of Present Illness: 55 yo female with history of hyperlipidemia, diabetes mellitus, and coronary artery disease status post placement of a Taxus drug-eluting stent in the proximal LAD in September 2007 who is here today for follow up. She was admitted to [facility] 01/14/09 with an acute anterior STEMI and was found to have very late thrombosis of the Taxus stent in the proximal LAD. Dr. Excell Seltzer treated her emergently with balloon angioplasty, no further stents were placed. LHC 6/10: ant apical AK, Ef 40%, dLM 20-30%, pLAD occluded at prox edge of stent (treated with PCI), R-L collats, pRCA 50%, mRCA 30%. Last echo 7/10: inf septal and apical HK, EF 45-50%, mild LAE. She was seen in follow up 07/12/11 with chest pain and palpitations. Myoview done 07/19/11: mod ant apical and inf apical scar, no ischemia, EF 45%. This was felt to be stable and med Rx was continued. Knee replacement 2013 and she did well. She is intolerant of statins. Stress myoview May 2015 with no ischemia, LVEF= 52%.  She is here today for folllow up. No chest pain. Occasional SOB. No syncope, palpitations, orthopnea, PND or significant pedal edema. She has lost 24 lbs since last visit. She is doing pool aerobics.   Primary Care Physician: Alva Garnet., MD  Past Medical History:  Diagnosis Date  . Arthritis   . CAD (coronary artery disease)    s/p Taxus DES to pLAD 9/07; ant STEMI 6/10 due to late stent thrombosis treated with thrombectomy and POBA to LAD (LHC: ant apical AK, Ef 40%, dLM 20-30%, pLAD occluded at prox edge of stent (treated with PCI), R-L collats, pRCA 50%, mRCA 30%.) ;Myoview done 07/19/11: mod ant apical and inf apical scar, no ischemia, EF 45%    . Diabetes mellitus   . Hyperlipidemia   . Hypertension   . Ischemic cardiomyopathy    echo 7/10: inf septal and apical HK, EF 45-50%, mild LAE.    Marland Kitchen Myocardial  infarction [facility]) [id number]    Past Surgical History:  Procedure Laterality Date  . CHOLECYSTECTOMY    . CORONARY ANGIOPLASTY  [id number]  . TONSILLECTOMY    . TONSILLECTOMY     age 7  . TOTAL KNEE ARTHROPLASTY  01/23/2012   Procedure: TOTAL KNEE ARTHROPLASTY;  Surgeon: Shelda Pal, MD;  Location: [facility];  Service: Orthopedics;  Laterality: Right;    Current Outpatient Prescriptions  Medication Sig Dispense Refill  . aspirin 81 MG tablet Take 81 mg by mouth daily with breakfast.     . carvedilol (COREG) 6.25 MG tablet Take 1 tablet (6.25 mg total) by mouth 2 (two) times daily with a meal. 60 tablet 11  . clopidogrel (PLAVIX) 75 MG tablet TAKE 1 TABLET (75 MG TOTAL) BY MOUTH DAILY. 90 tablet 0  . diazepam (VALIUM) 5 MG tablet Take 5 mg by mouth at bedtime as needed for muscle spasms.    Marland Kitchen glyBURIDE-metformin (GLUCOVANCE) 5-500 MG per tablet Take 2 tablets by mouth 2 (two) times daily with a meal.     . Insulin Glargine-Lixisenatide (SOLIQUA) 100-33 UNT-MCG/ML SOPN Inject 30-50 Units into the skin daily.    . ONE TOUCH ULTRA TEST test strip     . valsartan (DIOVAN) 80 MG tablet Take 80 mg by mouth at bedtime.     Marland Kitchen VICTOZA 18 MG/3ML SOLN Inject 1.8 mLs into the skin at bedtime.  No current facility-administered medications for this visit.     No Known Allergies  Social History   Social History  . Marital status: Single    Spouse name: N/A  . Number of children: N/A  . Years of education: N/A   Occupational History  . Not on file.   Social History Main Topics  . Smoking status: Former Smoker    Years: 15.00    Types: Cigarettes  . Smokeless tobacco: Former Neurosurgeon    Quit date: 01/18/1991     Comment: QUIT 21 YEARS AGO  . Alcohol use No  . Drug use: No  . Sexual activity: Not on file   Other Topics Concern  . Not on file   Social History Narrative  . No narrative on file    Family History  Problem Relation Age of Onset  . Lupus Mother   . Diabetes Father     . Cancer Maternal Grandmother   . Heart disease Maternal Grandfather   . Cancer Paternal Grandmother   . Arthritis Other   . Colon cancer Other   . Diabetes Other     Review of Systems:  As stated in the HPI and otherwise negative.   BP 140/80   Pulse 82   Ht 5\' 4"  (1.626 m)   Wt 240 lb 12.8 oz (109.2 kg)   BMI 41.33 kg/m   Physical Examination: General: Well developed, well nourished, NAD  HEENT: OP clear, mucus membranes moist  SKIN: warm, dry. No rashes. Neuro: No focal deficits  Musculoskeletal: Muscle strength 5/5 all ext  Psychiatric: Mood and affect normal  Neck: No JVD, no carotid bruits, no thyromegaly, no lymphadenopathy.  Lungs:Clear bilaterally, no wheezes, rhonci, crackles Cardiovascular: Regular rate and rhythm. No murmurs, gallops or rubs. Abdomen:Soft. Bowel sounds present. Non-tender.  Extremities: No lower extremity edema. Pulses are 2 + in the bilateral DP/PT.  Stress myoview May 2015: Stress Procedure: The patient received IV Lexiscan 0.4 mg over 15-seconds. Technetium 46m Sestamibi injected at 30-seconds. This patient had sob, tightness in her neck and a headache with the Lexiscan injection. Quantitative spect images were obtained after a 45 minute delay. Stress ECG: No significant change from baseline ECG  QPS Raw Data Images: Normal; no motion artifact; normal heart/lung ratio. Stress Images: Decreased uptake in the mid anteroseptal, apical anterior wall and in the true apex as well as in the apical lateral and mid inferolateral walls.  Rest Images: Decreased uptake in the mid anteroseptal, apical anterior wall and in the true apex as well as in the apical lateral and mid inferolateral walls.  Subtraction (SDS): There are two defects: 1. Medium size, severe severity irreversible defect in the mid anteroseptal and apical anterior and in the true wall. 2. There is a small area, moderate severity irreversible defect inthe apical lateral and mid  inferolateral walls.  Transient Ischemic Dilatation (Normal <1.22): 0.98 Lung/Heart Ratio (Normal <0.45): 0.23  Quantitative Gated Spect Images QGS EDV: 118 ml QGS ESV: 57 ml  Impression Exercise Capacity: Lexiscan with no exercise. BP Response: Normal blood pressure response. Clinical Symptoms: There is dyspnea. ECG Impression: No significant ST segment change suggestive of ischemia. Comparison with Prior Nuclear Study: No images to compare  Overall Impression: Intermediate risk stress nuclear study with small scars in the distal LAD and OM territory. No ischemia. .  LV Ejection Fraction: 52%. LV Wall Motion: Akinesis on the apical septal, lateral wall and in the true apex  EKG:  EKG is  ordered  today. The ekg ordered today demonstrates NSR, rate 83 bpm. Poor R wave progression precordial leads.   Lipids: Followed in primary care.   Recent Labs: No results found for requested labs within last 8760 hours.     Wt Readings from Last 3 Encounters:  04/06/16 240 lb 12.8 oz (109.2 kg)  03/17/15 164 lb (74.4 kg)  12/30/13 245 lb (111.1 kg)     Other studies Reviewed: Additional studies/ records that were reviewed today include: . Review of the above records demonstrates:   Assessment and Plan:   1. CAD: Stable. Stress myoview may 2015 with no ischemia. Continue current meds. Will continue ASA, Plavix and beta blocker. (she was not on Plavix at time of stent thrombosis in 2010)  2. HTN: BP well controlled at home. No changes.   3. HLD: Statin intolerant. Lipids followed in primary care and well controlled per pt. I will have her send the lipids profile over. If LDL not at goal, can consider PCSK9 inh therapy. 5  Current medicines are reviewed at length with the patient today.  The patient does not have concerns regarding medicines.  The following changes have been made:  no change  Labs/ tests ordered today include:   Orders Placed This Encounter  Procedures    . EKG 12-Lead     Disposition:   FU with me in 6 months   Signed, Verne Carrowhristopher Daisha Filosa, MD 04/06/2016 10:07 AM    [facility] [address], [place], [place]  [address] Phone: [phone]; Fax: [phone]

## 2016-04-06 NOTE — Patient Instructions (Signed)

## 2016-07-07 ENCOUNTER — Other Ambulatory Visit: Payer: Self-pay | Admitting: Cardiovascular Disease

## 2016-07-07 DIAGNOSIS — I2510 Atherosclerotic heart disease of native coronary artery without angina pectoris: Secondary | ICD-10-CM

## 2016-07-07 DIAGNOSIS — I10 Essential (primary) hypertension: Secondary | ICD-10-CM

## 2016-11-23 ENCOUNTER — Other Ambulatory Visit: Payer: Self-pay | Admitting: Gastroenterology

## 2016-11-23 DIAGNOSIS — R1310 Dysphagia, unspecified: Secondary | ICD-10-CM

## 2017-03-06 ENCOUNTER — Other Ambulatory Visit: Payer: Self-pay | Admitting: Internal Medicine

## 2017-03-06 DIAGNOSIS — R413 Other amnesia: Secondary | ICD-10-CM

## 2017-03-06 DIAGNOSIS — Z1231 Encounter for screening mammogram for malignant neoplasm of breast: Secondary | ICD-10-CM

## 2017-03-09 ENCOUNTER — Ambulatory Visit
Admission: RE | Admit: 2017-03-09 | Discharge: 2017-03-09 | Disposition: A | Discharge status: Home / Self Care | Payer: BC Managed Care – PPO | Source: Ambulatory Visit | Attending: Internal Medicine | Admitting: Internal Medicine

## 2017-03-09 DIAGNOSIS — Z1231 Encounter for screening mammogram for malignant neoplasm of breast: Secondary | ICD-10-CM

## 2017-03-12 ENCOUNTER — Other Ambulatory Visit: Payer: BC Managed Care – PPO

## 2017-04-05 ENCOUNTER — Other Ambulatory Visit: Payer: Self-pay | Admitting: Cardiovascular Disease

## 2017-04-05 DIAGNOSIS — I2510 Atherosclerotic heart disease of native coronary artery without angina pectoris: Secondary | ICD-10-CM

## 2017-04-05 DIAGNOSIS — I10 Essential (primary) hypertension: Secondary | ICD-10-CM

## 2018-04-16 ENCOUNTER — Telehealth: Payer: Self-pay | Admitting: *Deleted

## 2018-04-16 ENCOUNTER — Telehealth: Payer: Self-pay | Admitting: Cardiovascular Disease

## 2018-04-16 NOTE — Telephone Encounter (Signed)
   [facility] Pre-operative Risk Assessment    Request for surgical clearance:  1. What type of surgery is being performed?  DENTAL CLEANSING, SCALING, POSSIBLE SUBERG SCALING, INVASIVE WITH BLEEDING   2. When is this surgery scheduled?  TBD   3. What type of clearance is required (medical clearance vs. Pharmacy clearance to hold med vs. Both)?  BOTH  4. Are there any medications that need to be held prior to surgery and how long? PLAVIX   5. Practice name and name of physician performing surgery?  [facility]   6. What is your office phone number [phone]    7.   What is your office fax number [phone]  8.   Anesthesia type (None, local, MAC, general) ?    Jeanann Lewandowsky 04/16/2018, 3:31 PM  _________________________________________________________________   (provider comments below)

## 2018-04-16 NOTE — Telephone Encounter (Signed)
Walk in pt Form-Clearance dropped off placed in Triage box.

## 2018-04-17 NOTE — Telephone Encounter (Signed)
   Primary Cardiologist:Dr McAlhaney  Chart reviewed as part of pre-operative protocol coverage. Because of Tynesha Febles Andreasen's past medical history and time since last visit, he/she will require a follow-up visit in order to better assess preoperative cardiovascular risk.  Pre-op covering staff: - Please schedule appointment and call patient to inform them. - Please contact requesting surgeon's office via preferred method (i.e, phone, fax) to inform them of need for appointment prior to surgery.  Corine Shelter, PA-C  04/17/2018, 3:35 PM

## 2018-04-23 NOTE — Telephone Encounter (Signed)
Spoke with pt re: surgical clearance and that she will need to be seen in the office before this can be done. Pt has lost her insurance and was unsure if she could and wanted to know what the cost would be. I gave pt billings # and if she decides to make an appt, she will call back.  Will close this encounter.

## 2018-05-27 NOTE — Progress Notes (Signed)
Cardiology Office Note    Date:  05/28/2018   ID:  [name], DOB 1960-11-16, MRN [id number]  PCP:  [name], MD  Cardiologist: Verne Carrow, MD EPS: None  No chief complaint on file.   History of Present Illness:  [name] is a 57 y.o. female history of CAD status post Taxus DES to the proximal LAD 2007, anterior STEMI 01/2009 found to have very late thrombosis of the Taxus stent in the proximal LAD.  She had emergency balloon angioplasty and no further stents were placed.  Anterior apical akinesis EF 40% right to left collaterals, 50% RCA, 30% mid RCA.  Echo 02/2009 inferior septal and apical hypokinesis EF 45 to 50% with mild left atrial enlargement.  Myoview for chest pain in 2012 no ischemia EF 45%.  Myoview 2015 no ischemia EF 52%.  Also has hypertension, HLD.  Last saw Dr. Clifton James 03/2016.  Doing well.  Patient called in because she needed surgical clearance for dental cleaning, scaling and possible suberg scaling invasive with bleeding. Had chest pain recently that she thinks is heart burn. She broke her tooth and isn't chewing food properly and then she gets burning after she eats. Says it's different from angina. Does water aerobics twice a week without chest pain, shortness of breath, dizziness or presyncope. Couldn't tolerate lipitor b/c of leg aches. Takes CoQ10, red yeast rice. Still has no insurance. Awaiting disablitiy or medicaid.      Past Medical History:  Diagnosis Date  . Arthritis   . CAD (coronary artery disease)    s/p Taxus DES to pLAD 9/07; ant STEMI 6/10 due to late stent thrombosis treated with thrombectomy and POBA to LAD (LHC: ant apical AK, Ef 40%, dLM 20-30%, pLAD occluded at prox edge of stent (treated with PCI), R-L collats, pRCA 50%, mRCA 30%.) ;Myoview done 07/19/11: mod ant apical and inf apical scar, no ischemia, EF 45%    . Diabetes mellitus   . Hyperlipidemia   . Hypertension   . Ischemic cardiomyopathy    echo 7/10:  inf septal and apical HK, EF 45-50%, mild LAE.    Marland Kitchen Myocardial infarction [facility]) [id number]    Past Surgical History:  Procedure Laterality Date  . CHOLECYSTECTOMY    . CORONARY ANGIOPLASTY  4782,9562  . TONSILLECTOMY    . TONSILLECTOMY     age 51  . TOTAL KNEE ARTHROPLASTY  01/23/2012   Procedure: TOTAL KNEE ARTHROPLASTY;  Surgeon: Shelda Pal, MD;  Location: [facility];  Service: Orthopedics;  Laterality: Right;    Current Medications: No outpatient medications have been marked as taking for the 05/28/18 encounter (Office Visit) with Dyann Kief, PA-C.     Allergies:   Patient has no known allergies.   Social History   Socioeconomic History  . Marital status: Single    Spouse name: Not on file  . Number of children: Not on file  . Years of education: Not on file  . Highest education level: Not on file  Occupational History  . Not on file  Social Needs  . Financial resource strain: Not on file  . Food insecurity:    Worry: Not on file    Inability: Not on file  . Transportation needs:    Medical: Not on file    Non-medical: Not on file  Tobacco Use  . Smoking status: Former Smoker    Years: 15.00    Types: Cigarettes  . Smokeless tobacco: Former Neurosurgeon  Quit date: 01/18/1991  . Tobacco comment: QUIT 21 YEARS AGO  Substance and Sexual Activity  . Alcohol use: No  . Drug use: No  . Sexual activity: Not on file  Lifestyle  . Physical activity:    Days per week: Not on file    Minutes per session: Not on file  . Stress: Not on file  Relationships  . Social connections:    Talks on phone: Not on file    Gets together: Not on file    Attends religious service: Not on file    Active member of club or organization: Not on file    Attends meetings of clubs or organizations: Not on file    Relationship status: Not on file  Other Topics Concern  . Not on file  Social History Narrative  . Not on file     Family History:  The patient's family history includes  Arthritis in her other; Cancer in her maternal grandmother and paternal grandmother; Colon cancer in her other; Diabetes in her father and other; Heart disease in her maternal grandfather; Lupus in her mother.   ROS:   Please see the history of present illness.    Review of Systems  Constitution: Negative.  HENT: Negative.   Eyes: Negative.   Cardiovascular: Negative.   Respiratory: Negative.   Hematologic/Lymphatic: Negative.   Musculoskeletal: Positive for arthritis, back pain, muscle weakness, myalgias and stiffness. Negative for joint pain.  Gastrointestinal: Negative.   Genitourinary: Negative.   Neurological: Negative.    All other systems reviewed and are negative.   PHYSICAL EXAM:   VS:  BP 124/72   Pulse 73   Ht 5\' 4"  (1.626 m)   Wt 229 lb 12.8 oz (104.2 kg)   SpO2 98%   BMI 39.45 kg/m   Physical Exam  GEN: Well nourished, well developed, in no acute distress  Neck: no JVD, carotid bruits, or masses Cardiac:RRR; 1/6 systolic murmur at the left sternal border Respiratory:  clear to auscultation bilaterally, normal work of breathing GI: soft, nontender, nondistended, + BS Ext: without cyanosis, clubbing, or edema, Good distal pulses bilaterally Neuro:  Alert and Oriented x 3 Psych: euthymic mood, full affect  Wt Readings from Last 3 Encounters:  05/28/18 229 lb 12.8 oz (104.2 kg)  04/06/16 240 lb 12.8 oz (109.2 kg)  03/17/15 164 lb (74.4 kg)      Studies/Labs Reviewed:   EKG:  EKG is  ordered today.  The ekg ordered today demonstrates normal sinus rhythm, poor R wave progression anteriorly, no acute change  Recent Labs: No results found for requested labs within last 8760 hours.   Lipid Panel    Component Value Date/Time   CHOL  01/15/2009 0620    136        ATP III CLASSIFICATION:  <200     mg/dL   Desirable  191-478  mg/dL   Borderline High  >=295    mg/dL   High          TRIG 83 01/15/2009 0620   HDL 38 (L) 01/15/2009 0620   CHOLHDL 3.6  01/15/2009 0620   VLDL 17 01/15/2009 0620   LDLCALC  01/15/2009 0620    81        Total Cholesterol/HDL:CHD Risk Coronary Heart Disease Risk Table                     Men   Women  1/2 Average Risk   3.4  3.3  Average Risk       5.0   4.4  2 X Average Risk   9.6   7.1  3 X Average Risk  23.4   11.0        Use the calculated Patient Ratio above and the CHD Risk Table to determine the patient's CHD Risk.        ATP III CLASSIFICATION (LDL):  <100     mg/dL   Optimal  621-308  mg/dL   Near or Above                    Optimal  130-159  mg/dL   Borderline  657-846  mg/dL   High  >962     mg/dL   Very High    Additional studies/ records that were reviewed today include:  Lexiscan 2015 Impression Exercise Capacity:  Lexiscan with no exercise. BP Response:  Normal blood pressure response. Clinical Symptoms:  There is dyspnea. ECG Impression:  No significant ST segment change suggestive of ischemia. Comparison with Prior Nuclear Study: No images to compare   Overall Impression:  Intermediate risk stress nuclear study with small scars in the distal LAD and OM territory. No ischemia. .   LV Ejection Fraction: 52%.  LV Wall Motion:  Akinesis on the apical septal, lateral wall and in the true apex      ASSESSMENT:    1. Atherosclerosis of native coronary artery of native heart without angina pectoris   2. Mixed hyperlipidemia   3. Essential hypertension      PLAN:  In order of problems listed above: Preoperative clearance before dental cleaning and scaling at [facility] and they want her to hold Plavix for the procedure.  Low risk surgery. Does not need antibiotics or need to hold plavix for this.  Her functional capacity is low because of severe back pain.  Revised cardiac risk index is elevated because of previous MI and diabetes on insulin.  She is asymptomatic from a cardiac standpoint.  Can proceed with dental cleaning without further cardiac work-up.   According to the Revised  Cardiac Risk Index (RCRI), her Perioperative Risk of Major Cardiac Event is (%): 6.6  Her Functional Capacity in METs is: 3.63 according to the Duke Activity Status Index (DASI).    CAD status post Taxus DES to proximal LAD in 2007, anterior STEMI 01/2009 with very late thrombosis of the Taxus stent in the proximal LAD treated with emergency balloon angioplasty.  No further stents placed.  Anterior apical akinesis EF 40%.  Mild nonobstructive disease elsewhere.  No ischemia on Lexiscan 2015 .no angina.  Hyperlipidemia intolerant to statin consider PCSK9 inhibitor once she gets disability or Medicaid.  Patient has no insurance and cannot afford anything at this time.  On co-Q10 and red yeast rice.  Essential hypertension blood pressure controlled on carvedilol.   Medication Adjustments/Labs and Tests Ordered: Current medicines are reviewed at length with the patient today.  Concerns regarding medicines are outlined above.  Medication changes, Labs and Tests ordered today are listed in the Patient Instructions below. There are no Patient Instructions on file for this visit.   Elson Clan, PA-C  05/28/2018 9:29 AM    [facility] [address] [place], [place], [place]  [address] Phone: [phone]; Fax: [phone]

## 2018-05-28 ENCOUNTER — Encounter: Payer: Self-pay | Admitting: Physician Assistant

## 2018-05-28 ENCOUNTER — Ambulatory Visit (INDEPENDENT_AMBULATORY_CARE_PROVIDER_SITE_OTHER): Payer: Self-pay | Admitting: Physician Assistant

## 2018-05-28 VITALS — BP 124/72 | HR 73 | Ht 64.0 in | Wt 229.8 lb

## 2018-05-28 DIAGNOSIS — I2510 Atherosclerotic heart disease of native coronary artery without angina pectoris: Secondary | ICD-10-CM

## 2018-05-28 DIAGNOSIS — E782 Mixed hyperlipidemia: Secondary | ICD-10-CM

## 2018-05-28 DIAGNOSIS — I10 Essential (primary) hypertension: Secondary | ICD-10-CM

## 2018-05-28 NOTE — Patient Instructions (Signed)
Medication Instructions:  Your physician recommends that you continue on your current medications as directed. Please refer to the Current Medication list given to you today.  If you need a refill on your cardiac medications before your next appointment, please call your pharmacy.   Lab work: None Ordered  If you have labs (blood work) drawn today and your tests are completely normal, you will receive your results only by: . MyChart Message (if you have MyChart) OR . A paper copy in the mail If you have any lab test that is abnormal or we need to change your treatment, we will call you to review the results.  Testing/Procedures: None ordered  Follow-Up: At [facility], you and your health needs are our priority.  As part of our continuing mission to provide you with exceptional heart care, we have created designated Provider Care Teams.  These Care Teams include your primary Cardiologist (physician) and Advanced Practice Providers (APPs -  Physician Assistants and Nurse Practitioners) who all work together to provide you with the care you need, when you need it. . You will need a follow up appointment in 1 year.  Please call our office 2 months in advance to schedule this appointment.  You may see Chris McAlhany, MD or one of the following Advanced Practice Providers on your designated Care Team:   . Brittainy Simmons, PA-C . Dayna Dunn, PA-C . Michele Lenze, PA-C  Any Other Special Instructions Will Be Listed Below (If Applicable).    

## 2018-05-28 NOTE — Progress Notes (Signed)
Office Visit Note with Clearance faxed via Epic to the Dental Hygiene Department at [facility].

## 2018-08-14 ENCOUNTER — Ambulatory Visit
Admission: RE | Admit: 2018-08-14 | Discharge: 2018-08-14 | Disposition: A | Discharge status: Home / Self Care | Payer: BLUE CROSS/BLUE SHIELD | Source: Ambulatory Visit | Attending: Internal Medicine | Admitting: Internal Medicine

## 2018-08-14 ENCOUNTER — Other Ambulatory Visit: Payer: Self-pay | Admitting: Internal Medicine

## 2018-08-14 DIAGNOSIS — R059 Cough, unspecified: Secondary | ICD-10-CM

## 2018-08-14 DIAGNOSIS — R05 Cough: Secondary | ICD-10-CM

## 2018-08-29 ENCOUNTER — Other Ambulatory Visit: Payer: Self-pay | Admitting: Internal Medicine

## 2018-08-29 DIAGNOSIS — N6310 Unspecified lump in the right breast, unspecified quadrant: Secondary | ICD-10-CM

## 2018-09-03 ENCOUNTER — Ambulatory Visit
Admission: RE | Admit: 2018-09-03 | Discharge: 2018-09-03 | Disposition: A | Discharge status: Home / Self Care | Payer: BLUE CROSS/BLUE SHIELD | Source: Ambulatory Visit | Attending: Internal Medicine | Admitting: Internal Medicine

## 2018-09-03 DIAGNOSIS — N6310 Unspecified lump in the right breast, unspecified quadrant: Secondary | ICD-10-CM

## 2019-10-23 ENCOUNTER — Encounter (HOSPITAL_COMMUNITY): Payer: Self-pay

## 2019-10-23 ENCOUNTER — Emergency Department (HOSPITAL_COMMUNITY)
Admission: EM | Admit: 2019-10-23 | Discharge: 2019-10-23 | Disposition: A | Discharge status: Home / Self Care | Payer: BLUE CROSS/BLUE SHIELD | Attending: Emergency Medicine | Admitting: Emergency Medicine

## 2019-10-23 DIAGNOSIS — Z955 Presence of coronary angioplasty implant and graft: Secondary | ICD-10-CM | POA: Insufficient documentation

## 2019-10-23 DIAGNOSIS — Z87891 Personal history of nicotine dependence: Secondary | ICD-10-CM | POA: Insufficient documentation

## 2019-10-23 DIAGNOSIS — I2510 Atherosclerotic heart disease of native coronary artery without angina pectoris: Secondary | ICD-10-CM | POA: Insufficient documentation

## 2019-10-23 DIAGNOSIS — E119 Type 2 diabetes mellitus without complications: Secondary | ICD-10-CM | POA: Insufficient documentation

## 2019-10-23 DIAGNOSIS — W19XXXA Unspecified fall, initial encounter: Secondary | ICD-10-CM

## 2019-10-23 DIAGNOSIS — Y9389 Activity, other specified: Secondary | ICD-10-CM | POA: Insufficient documentation

## 2019-10-23 DIAGNOSIS — W01118A Fall on same level from slipping, tripping and stumbling with subsequent striking against other sharp object, initial encounter: Secondary | ICD-10-CM | POA: Insufficient documentation

## 2019-10-23 DIAGNOSIS — R519 Headache, unspecified: Secondary | ICD-10-CM | POA: Insufficient documentation

## 2019-10-23 DIAGNOSIS — I10 Essential (primary) hypertension: Secondary | ICD-10-CM | POA: Insufficient documentation

## 2019-10-23 DIAGNOSIS — Z794 Long term (current) use of insulin: Secondary | ICD-10-CM | POA: Insufficient documentation

## 2019-10-23 DIAGNOSIS — Y999 Unspecified external cause status: Secondary | ICD-10-CM | POA: Insufficient documentation

## 2019-10-23 DIAGNOSIS — Z79899 Other long term (current) drug therapy: Secondary | ICD-10-CM | POA: Insufficient documentation

## 2019-10-23 DIAGNOSIS — Z7901 Long term (current) use of anticoagulants: Secondary | ICD-10-CM | POA: Insufficient documentation

## 2019-10-23 DIAGNOSIS — Y92009 Unspecified place in unspecified non-institutional (private) residence as the place of occurrence of the external cause: Secondary | ICD-10-CM

## 2019-10-23 DIAGNOSIS — Y92002 Bathroom of unspecified non-institutional (private) residence single-family (private) house as the place of occurrence of the external cause: Secondary | ICD-10-CM | POA: Insufficient documentation
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
## 2019-10-23 NOTE — Discharge Instructions (Signed)
You for allowing [place] to take care of you,  [name].  Please schedule a follow-up appointment with your primary doctor as soon as possible to discuss your fall.  If you start having weakness on one side of your body, confusion or increased headaches, it may be a sign that you have a brain bleed.  Come back to the ER if that is the case.  Take care!
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
## 2019-12-10 NOTE — Progress Notes (Signed)
Chief Complaint  Patient presents with  . Follow-up    CAD   History of Present Illness: 59 yo female with history of hyperlipidemia, diabetes mellitus, and coronary artery disease who is here today for follow up. She had a Taxus drug eluting stent placed in the LAD in 2007. She was admitted to [facility] June 2010 with an acute anterior STEMI and was found to have very late thrombosis of the Taxus stent in the proximal LAD. This was treated with balloon angioplasty. No new stents were placed. Her cardiac cath showed distal left main 20-30%, proximal LAD occluded at the proximal edge of the old stent, 50% proximal RCA, 30% mid RCA stenosis. Echo June 2010 with inferior, septal and apical hypokinesis, LVEF 45-50%. She is intolerant of statins and has not wished to consider PCSK9 inh therapy due to cost and lack of insurance coverage. Stress myoview May 2015 with no ischemia, LVEF=52%.  She is here today for follow up. The patient denies any chest pain, dyspnea, palpitations, lower extremity edema, orthopnea, PND, dizziness, near syncope or syncope. She is limited by back pain. She does not wish to have any further cardiac testing right now due to lack of insurance.   Primary Care Physician: [name], MD  Past Medical History:  Diagnosis Date  . Arthritis   . CAD (coronary artery disease)    s/p Taxus DES to pLAD 9/07; ant STEMI 6/10 due to late stent thrombosis treated with thrombectomy and POBA to LAD (LHC: ant apical AK, Ef 40%, dLM 20-30%, pLAD occluded at prox edge of stent (treated with PCI), R-L collats, pRCA 50%, mRCA 30%.) ;Myoview done 07/19/11: mod ant apical and inf apical scar, no ischemia, EF 45%    . Diabetes mellitus   . Hyperlipidemia   . Hypertension   . Ischemic cardiomyopathy    echo 7/10: inf septal and apical HK, EF 45-50%, mild LAE.    Marland Kitchen Myocardial infarction [facility]) [id number]    Past Surgical History:  Procedure Laterality Date  . CHOLECYSTECTOMY     . CORONARY ANGIOPLASTY  7517,0017  . TONSILLECTOMY    . TONSILLECTOMY     age 61  . TOTAL KNEE ARTHROPLASTY  01/23/2012   Procedure: TOTAL KNEE ARTHROPLASTY;  Surgeon: Shelda Pal, MD;  Location: [facility];  Service: Orthopedics;  Laterality: Right;    Current Outpatient Medications  Medication Sig Dispense Refill  . aspirin 81 MG tablet Take 81 mg by mouth daily with breakfast.     . carvedilol (COREG) 6.25 MG tablet Take 1 tablet (6.25 mg total) by mouth 2 (two) times daily with a meal. 60 tablet 11  . clopidogrel (PLAVIX) 75 MG tablet TAKE 1 TABLET (75 MG TOTAL) BY MOUTH DAILY. 30 tablet 0  . glyBURIDE-metformin (GLUCOVANCE) 5-500 MG per tablet Take 2 tablets by mouth 2 (two) times daily with a meal.     . insulin degludec (TRESIBA FLEXTOUCH) 200 UNIT/ML FlexTouch Pen Inject 50 Units into the skin daily.    . ONE TOUCH ULTRA TEST test strip     . valsartan (DIOVAN) 80 MG tablet Take 80 mg by mouth at bedtime.      No current facility-administered medications for this visit.    Allergies  Allergen Reactions  . Ozempic (0.25 Or 0.5 Mg-Dose)  [Semaglutide(0.25 Or 0.5mg -Dos)] Itching    Social History   Socioeconomic History  . Marital status: Single    Spouse name: Not on file  . Number of  children: Not on file  . Years of education: Not on file  . Highest education level: Not on file  Occupational History  . Not on file  Tobacco Use  . Smoking status: Former Smoker    Years: 15.00    Types: Cigarettes  . Smokeless tobacco: Former Neurosurgeon    Quit date: 01/18/1991  . Tobacco comment: QUIT 21 YEARS AGO  Substance and Sexual Activity  . Alcohol use: No  . Drug use: No  . Sexual activity: Not on file  Other Topics Concern  . Not on file  Social History Narrative  . Not on file   Social Determinants of Health   Financial Resource Strain:   . Difficulty of Paying Living Expenses:   Food Insecurity:   . Worried About Programme researcher, broadcasting/film/video in the Last Year:   . Garment/textile technologist in the Last Year:   Transportation Needs:   . Freight forwarder (Medical):   Marland Kitchen Lack of Transportation (Non-Medical):   Physical Activity:   . Days of Exercise per Week:   . Minutes of Exercise per Session:   Stress:   . Feeling of Stress :   Social Connections:   . Frequency of Communication with Friends and Family:   . Frequency of Social Gatherings with Friends and Family:   . Attends Religious Services:   . Active Member of Clubs or Organizations:   . Attends Banker Meetings:   Marland Kitchen Marital Status:   Intimate Partner Violence:   . Fear of Current or Ex-Partner:   . Emotionally Abused:   Marland Kitchen Physically Abused:   . Sexually Abused:     Family History  Problem Relation Age of Onset  . Lupus Mother   . Diabetes Father   . Cancer Maternal Grandmother   . Heart disease Maternal Grandfather   . Cancer Paternal Grandmother   . Arthritis Other   . Colon cancer Other   . Diabetes Other     Review of Systems:  As stated in the HPI and otherwise negative.   BP 130/86   Pulse 81   Ht 5\' 4"  (1.626 m)   Wt 235 lb (106.6 kg)   SpO2 97%   BMI 40.34 kg/m   Physical Examination: General: Well developed, well nourished, NAD  HEENT: OP clear, mucus membranes moist  SKIN: warm, dry. No rashes. Neuro: No focal deficits  Musculoskeletal: Muscle strength 5/5 all ext  Psychiatric: Mood and affect normal  Neck: No JVD, no carotid bruits, no thyromegaly, no lymphadenopathy.  Lungs:Clear bilaterally, no wheezes, rhonci, crackles Cardiovascular: Regular rate and rhythm. No murmurs, gallops or rubs. Abdomen:Soft. Bowel sounds present. Non-tender.  Extremities: No lower extremity edema. Pulses are 2 + in the bilateral DP/PT.  EKG:  EKG is ordered today. The ekg ordered today demonstrates sinus, Poor R wave progression precordial leads. Non-specific ST and T wave abn  Lipid Profile: Lipids: Followed in primary care.   Recent Labs: No results found for  requested labs within last 8760 hours.     Wt Readings from Last 3 Encounters:  12/11/19 235 lb (106.6 kg)  10/23/19 234 lb (106.1 kg)  05/28/18 229 lb 12.8 oz (104.2 kg)      Assessment and Plan:   1. CAD without angina: No chest pain. Continue ASA and Plavix (ongoing DAPT due to prior late thrombosis of the Taxus LAD stent). Continue beta blocker. Will arrange an echo when she has insurance coverage to assess LV  systolic function.   2. HTN: BP is well controlled. Continue current therapy  3. HLD: She is intolerant of statins. Lipids followed in primary care. Last LDL unknown, No data on KPN form. ? PCSK9-inh therapy when she has insurance.   Current medicines are reviewed at length with the patient today.  The patient does not have concerns regarding medicines.  The following changes have been made:  no change  Labs/ tests ordered today include:   Orders Placed This Encounter  Procedures  . EKG 12-Lead   Disposition:   FU with me in 12 months  Signed, Lauree Chandler, MD 12/11/2019 2:19 PM    [facility] [address], [place], [place]  [address] Phone: [phone]; Fax: [phone]

## 2019-12-11 ENCOUNTER — Ambulatory Visit (INDEPENDENT_AMBULATORY_CARE_PROVIDER_SITE_OTHER): Payer: Self-pay | Admitting: Cardiovascular Disease

## 2019-12-11 ENCOUNTER — Encounter: Payer: Self-pay | Admitting: Cardiovascular Disease

## 2019-12-11 ENCOUNTER — Other Ambulatory Visit: Payer: Self-pay

## 2019-12-11 VITALS — BP 130/86 | HR 81 | Ht 64.0 in | Wt 235.0 lb

## 2019-12-11 DIAGNOSIS — E782 Mixed hyperlipidemia: Secondary | ICD-10-CM

## 2019-12-11 DIAGNOSIS — I2510 Atherosclerotic heart disease of native coronary artery without angina pectoris: Secondary | ICD-10-CM

## 2019-12-11 DIAGNOSIS — I10 Essential (primary) hypertension: Secondary | ICD-10-CM

## 2019-12-11 NOTE — Patient Instructions (Addendum)
Medication Instructions:  *If you need a refill on your cardiac medications before your next appointment, please call your pharmacy*  Lab Work: If you have labs (blood work) drawn today and your tests are completely normal, you will receive your results only by: . MyChart Message (if you have MyChart) OR . A paper copy in the mail If you have any lab test that is abnormal or we need to change your treatment, we will call you to review the results.  Follow-Up: At [facility], you and your health needs are our priority.  As part of our continuing mission to provide you with exceptional heart care, we have created designated Provider Care Teams.  These Care Teams include your primary Cardiologist (physician) and Advanced Practice Providers (APPs -  Physician Assistants and Nurse Practitioners) who all work together to provide you with the care you need, when you need it.  We recommend signing up for the patient portal called "MyChart".  Sign up information is provided on this After Visit Summary.  MyChart is used to connect with patients for Virtual Visits (Telemedicine).  Patients are able to view lab/test results, encounter notes, upcoming appointments, etc.  Non-urgent messages can be sent to your provider as well.   To learn more about what you can do with MyChart, go to [email or web address].    Your next appointment:   6 month(s)  The format for your next appointment:   In Person  Provider:   You may see Christopher McAlhany, MD or one of the following Advanced Practice Providers on your designated Care Team:    Dayna Dunn, PA-C  Michele Lenze, PA-C   

## 2020-08-19 ENCOUNTER — Other Ambulatory Visit: Payer: Self-pay | Admitting: Internal Medicine

## 2020-08-19 DIAGNOSIS — M48061 Spinal stenosis, lumbar region without neurogenic claudication: Secondary | ICD-10-CM

## 2020-09-08 ENCOUNTER — Other Ambulatory Visit: Payer: Self-pay

## 2020-09-09 ENCOUNTER — Encounter: Payer: Self-pay | Admitting: Cardiovascular Disease

## 2020-09-09 ENCOUNTER — Ambulatory Visit (INDEPENDENT_AMBULATORY_CARE_PROVIDER_SITE_OTHER): Payer: Medicare HMO | Admitting: Cardiovascular Disease

## 2020-09-09 ENCOUNTER — Other Ambulatory Visit: Payer: Self-pay

## 2020-09-09 VITALS — BP 140/70 | HR 76 | Ht 64.0 in | Wt 249.0 lb

## 2020-09-09 DIAGNOSIS — I2510 Atherosclerotic heart disease of native coronary artery without angina pectoris: Secondary | ICD-10-CM

## 2020-09-09 DIAGNOSIS — E782 Mixed hyperlipidemia: Secondary | ICD-10-CM

## 2020-09-09 DIAGNOSIS — I10 Essential (primary) hypertension: Secondary | ICD-10-CM | POA: Diagnosis not present

## 2020-09-09 NOTE — Progress Notes (Signed)
Chief Complaint  Patient presents with  . Follow-up    CAD   History of Present Illness: 60 yo female with history of hyperlipidemia, diabetes mellitus, and coronary artery disease who is here today for follow up. She had a Taxus drug eluting stent placed in the LAD in 2007. She was admitted to [facility] June 2010 with an acute anterior STEMI and was found to have very late thrombosis of the Taxus stent in the proximal LAD. This was treated with balloon angioplasty. No new stents were placed. Her cardiac cath showed distal left main 20-30%, proximal LAD occluded at the proximal edge of the old stent, 50% proximal RCA, 30% mid RCA stenosis. Echo June 2010 with inferior, septal and apical hypokinesis, LVEF 45-50%. She is intolerant of statins and has not wished to consider PCSK9 inh therapy due to cost and lack of insurance coverage. Stress myoview May 2015 with no ischemia, LVEF=52%.  She is here today for follow up. The patient denies any chest pain, dyspnea, palpitations, mild lower extremity edema, orthopnea, PND, dizziness, near syncope or syncope.   Primary Care Physician: [name], MD  Past Medical History:  Diagnosis Date  . Arthritis   . CAD (coronary artery disease)    s/p Taxus DES to pLAD 9/07; ant STEMI 6/10 due to late stent thrombosis treated with thrombectomy and POBA to LAD (LHC: ant apical AK, Ef 40%, dLM 20-30%, pLAD occluded at prox edge of stent (treated with PCI), R-L collats, pRCA 50%, mRCA 30%.) ;Myoview done 07/19/11: mod ant apical and inf apical scar, no ischemia, EF 45%    . Diabetes mellitus   . Hyperlipidemia   . Hypertension   . Ischemic cardiomyopathy    echo 7/10: inf septal and apical HK, EF 45-50%, mild LAE.    Marland Kitchen Myocardial infarction [facility]) [phone]    Past Surgical History:  Procedure Laterality Date  . CHOLECYSTECTOMY    . CORONARY ANGIOPLASTY  9622,2979  . TONSILLECTOMY    . TONSILLECTOMY     age 13  . TOTAL KNEE  ARTHROPLASTY  01/23/2012   Procedure: TOTAL KNEE ARTHROPLASTY;  Surgeon: Shelda Pal, MD;  Location: [facility];  Service: Orthopedics;  Laterality: Right;    Current Outpatient Medications  Medication Sig Dispense Refill  . aspirin 81 MG tablet Take 81 mg by mouth daily with breakfast.    . carvedilol (COREG) 6.25 MG tablet Take 1 tablet (6.25 mg total) by mouth 2 (two) times daily with a meal. 60 tablet 11  . clopidogrel (PLAVIX) 75 MG tablet TAKE 1 TABLET (75 MG TOTAL) BY MOUTH DAILY. 30 tablet 0  . glyBURIDE-metformin (GLUCOVANCE) 5-500 MG per tablet Take 2 tablets by mouth 2 (two) times daily with a meal.    . insulin degludec (TRESIBA FLEXTOUCH) 200 UNIT/ML FlexTouch Pen Inject 50 Units into the skin daily.    . ONE TOUCH ULTRA TEST test strip     . valsartan (DIOVAN) 80 MG tablet Take 80 mg by mouth at bedtime.     No current facility-administered medications for this visit.    Allergies  Allergen Reactions  . Ozempic (0.25 Or 0.5 Mg-Dose)  [Semaglutide(0.25 Or 0.5mg -Dos)] Itching    Social History   Socioeconomic History  . Marital status: Single    Spouse name: Not on file  . Number of children: Not on file  . Years of education: Not on file  . Highest education level: Not on file  Occupational History  .  Not on file  Tobacco Use  . Smoking status: Former Smoker    Years: 15.00    Types: Cigarettes  . Smokeless tobacco: Former Neurosurgeon    Quit date: 01/18/1991  . Tobacco comment: QUIT 21 YEARS AGO  Substance and Sexual Activity  . Alcohol use: No  . Drug use: No  . Sexual activity: Not on file  Other Topics Concern  . Not on file  Social History Narrative  . Not on file   Social Determinants of Health   Financial Resource Strain: Not on file  Food Insecurity: Not on file  Transportation Needs: Not on file  Physical Activity: Not on file  Stress: Not on file  Social Connections: Not on file  Intimate Partner Violence: Not on file    Family History  Problem  Relation Age of Onset  . Lupus Mother   . Diabetes Father   . Cancer Maternal Grandmother   . Heart disease Maternal Grandfather   . Cancer Paternal Grandmother   . Arthritis Other   . Colon cancer Other   . Diabetes Other     Review of Systems:  As stated in the HPI and otherwise negative.   BP 140/70   Pulse 76   Ht 5\' 4"  (1.626 m)   Wt 249 lb (112.9 kg)   SpO2 97%   BMI 42.74 kg/m   Physical Examination: General: Well developed, well nourished, NAD  HEENT: OP clear, mucus membranes moist  SKIN: warm, dry. No rashes. Neuro: No focal deficits  Musculoskeletal: Muscle strength 5/5 all ext  Psychiatric: Mood and affect normal  Neck: No JVD, no carotid bruits, no thyromegaly, no lymphadenopathy.  Lungs:Clear bilaterally, no wheezes, rhonci, crackles Cardiovascular: Regular rate and rhythm. No murmurs, gallops or rubs. Abdomen:Soft. Bowel sounds present. Non-tender.  Extremities: No lower extremity edema. Pulses are 2 + in the bilateral DP/PT.  EKG:  EKG is not ordered today. The ekg ordered today demonstrates  Lipid Profile: Lipids: Followed in primary care.   Recent Labs: No results found for requested labs within last 8760 hours.     Wt Readings from Last 3 Encounters:  09/09/20 249 lb (112.9 kg)  12/11/19 235 lb (106.6 kg)  10/23/19 234 lb (106.1 kg)      Assessment and Plan:   1. CAD without angina: She has no chest pain. Will continue DAPT with ASA and Plavix given the prior late thrombosis of the Taxus LAD stent. Continue beta blocker. She is statin intolerant and has not wished to try Repatha. We talked about an echo at her last visit but she did not schedule this due to lack of insurance.  Will arrange an echo now.   2. HTN: BP is well controlled. Continue current therapy  3. HLD: She is intolerant of statins. Lipids followed in primary care. Pt will ask her Dr. 10/25/19 for results. We have discussed starting PCSK9-inh therapy when she has insurance.    Current medicines are reviewed at length with the patient today.  The patient does not have concerns regarding medicines.  The following changes have been made:  no change  Labs/ tests ordered today include:   Orders Placed This Encounter  Procedures  . ECHOCARDIOGRAM COMPLETE   Disposition:   FU with me in 12 months  Signed, Renae Gloss, MD 09/09/2020 9:51 AM    [facility] [address] [place], [place], [place]  [place] Phone: [phone]; Fax: [phone]

## 2020-09-09 NOTE — Patient Instructions (Signed)
Medication Instructions:  Your physician recommends that you continue on your current medications as directed. Please refer to the Current Medication list given to you today.  *If you need a refill on your cardiac medications before your next appointment, please call your pharmacy*   Lab Work: None today    Testing/Procedures: Your physician has requested that you have an echocardiogram. Echocardiography is a painless test that uses sound waves to create images of your heart. It provides your doctor with information about the size and shape of your heart and how well your heart's chambers and valves are working. This procedure takes approximately one hour. There are no restrictions for this procedure.    Follow-Up: At [facility], you and your health needs are our priority.  As part of our continuing mission to provide you with exceptional heart care, we have created designated Provider Care Teams.  These Care Teams include your primary Cardiologist (physician) and Advanced Practice Providers (APPs -  Physician Assistants and Nurse Practitioners) who all work together to provide you with the care you need, when you need it.  We recommend signing up for the patient portal called "MyChart".  Sign up information is provided on this After Visit Summary.  MyChart is used to connect with patients for Virtual Visits (Telemedicine).  Patients are able to view lab/test results, encounter notes, upcoming appointments, etc.  Non-urgent messages can be sent to your provider as well.   To learn more about what you can do with MyChart, go to [email or web address].    Your next appointment:   1 year(s)  The format for your next appointment:   In Person  Provider:   You may see Verne Carrow, MD or one of the following Advanced Practice Providers on your designated Care Team:    Ronie Spies, PA-C  Jacolyn Reedy, PA-C

## 2020-09-30 ENCOUNTER — Other Ambulatory Visit (HOSPITAL_COMMUNITY): Payer: Medicare HMO

## 2020-10-14 ENCOUNTER — Ambulatory Visit (INDEPENDENT_AMBULATORY_CARE_PROVIDER_SITE_OTHER): Payer: Medicare HMO | Admitting: Otolaryngology

## 2020-10-14 ENCOUNTER — Other Ambulatory Visit: Payer: Self-pay

## 2020-10-14 DIAGNOSIS — H6123 Impacted cerumen, bilateral: Secondary | ICD-10-CM | POA: Diagnosis not present

## 2020-10-14 NOTE — Progress Notes (Signed)
HPI: [name] is a 60 y.o. female who presents for evaluation of blockage of her hearing and ear discomfort especially on the right side.  She feels like she might have wax buildup.Marland Kitchen  Past Medical History:  Diagnosis Date  . Arthritis   . CAD (coronary artery disease)    s/p Taxus DES to pLAD 9/07; ant STEMI 6/10 due to late stent thrombosis treated with thrombectomy and POBA to LAD (LHC: ant apical AK, Ef 40%, dLM 20-30%, pLAD occluded at prox edge of stent (treated with PCI), R-L collats, pRCA 50%, mRCA 30%.) ;Myoview done 07/19/11: mod ant apical and inf apical scar, no ischemia, EF 45%    . Diabetes mellitus   . Hyperlipidemia   . Hypertension   . Ischemic cardiomyopathy    echo 7/10: inf septal and apical HK, EF 45-50%, mild LAE.    Marland Kitchen Myocardial infarction [facility]) [phone]   Past Surgical History:  Procedure Laterality Date  . CHOLECYSTECTOMY    . CORONARY ANGIOPLASTY  5638,7564  . TONSILLECTOMY    . TONSILLECTOMY     age 38  . TOTAL KNEE ARTHROPLASTY  01/23/2012   Procedure: TOTAL KNEE ARTHROPLASTY;  Surgeon: Shelda Pal, MD;  Location: [facility];  Service: Orthopedics;  Laterality: Right;   Social History   Socioeconomic History  . Marital status: Single    Spouse name: Not on file  . Number of children: Not on file  . Years of education: Not on file  . Highest education level: Not on file  Occupational History  . Not on file  Tobacco Use  . Smoking status: Former Smoker    Years: 15.00    Types: Cigarettes  . Smokeless tobacco: Former Neurosurgeon    Quit date: 01/18/1991  . Tobacco comment: QUIT 21 YEARS AGO  Substance and Sexual Activity  . Alcohol use: No  . Drug use: No  . Sexual activity: Not on file  Other Topics Concern  . Not on file  Social History Narrative  . Not on file   Social Determinants of Health   Financial Resource Strain: Not on file  Food Insecurity: Not on file  Transportation Needs: Not on file  Physical Activity: Not on file   Stress: Not on file  Social Connections: Not on file   Family History  Problem Relation Age of Onset  . Lupus Mother   . Diabetes Father   . Cancer Maternal Grandmother   . Heart disease Maternal Grandfather   . Cancer Paternal Grandmother   . Arthritis Other   . Colon cancer Other   . Diabetes Other    Allergies  Allergen Reactions  . Ozempic (0.25 Or 0.5 Mg-Dose)  [Semaglutide(0.25 Or 0.5mg -Dos)] Itching   Prior to Admission medications   Medication Sig Start Date End Date Taking? Authorizing Provider  aspirin 81 MG tablet Take 81 mg by mouth daily with breakfast.    [provider]  carvedilol (COREG) 6.25 MG tablet Take 1 tablet (6.25 mg total) by mouth 2 (two) times daily with a meal. 12/05/13   Kathleene Hazel, MD  clopidogrel (PLAVIX) 75 MG tablet TAKE 1 TABLET (75 MG TOTAL) BY MOUTH DAILY. 04/05/17   Kathleene Hazel, MD  glyBURIDE-metformin (GLUCOVANCE) 5-500 MG per tablet Take 2 tablets by mouth 2 (two) times daily with a meal.    [provider]  insulin degludec (TRESIBA FLEXTOUCH) 200 UNIT/ML FlexTouch Pen Inject 50 Units into the skin daily.    [provider]  ONE TOUCH ULTRA TEST test strip  08/13/11   [provider]  valsartan (DIOVAN) 80 MG tablet Take 80 mg by mouth at bedtime.    [provider]     Positive ROS: Otherwise negative  All other systems have been reviewed and were otherwise negative with the exception of those mentioned in the HPI and as above.  Physical Exam: Constitutional: Alert, well-appearing, no acute distress Ears: External ears without lesions or tenderness. Ear canals with a large amount of wax in both ear canals that was obstructing the ear canals and TMs.  This was cleaned using forceps suction and curette.  After cleaning the ear canals TMs are otherwise clear.. Nasal: External nose without lesions. Clear nasal passages Oral: Oropharynx clear. Neck: No palpable adenopathy or  masses Respiratory: Breathing comfortably  Skin: No facial/neck lesions or rash noted.  Cerumen impaction removal  Date/Time: 10/14/2020 10:44 AM Performed by: Drema Halon, MD Authorized by: Drema Halon, MD   Consent:    Consent obtained:  Verbal   Consent given by:  Patient   Risks discussed:  Pain and bleeding Procedure details:    Location:  L ear and R ear   Procedure type: curette, suction and forceps   Post-procedure details:    Inspection:  TM intact and canal normal   Hearing quality:  Improved   Patient tolerance of procedure:  Tolerated well, no immediate complications Comments:     TMs are clear bilaterally.    Assessment: Bilateral cerumen impactions  Plan: She will follow-up as needed  Narda Bonds, MD

## 2020-10-22 ENCOUNTER — Other Ambulatory Visit: Payer: Self-pay

## 2020-10-22 ENCOUNTER — Ambulatory Visit (HOSPITAL_COMMUNITY): Payer: Medicare HMO | Attending: Cardiovascular Disease

## 2020-10-22 DIAGNOSIS — I2510 Atherosclerotic heart disease of native coronary artery without angina pectoris: Secondary | ICD-10-CM | POA: Insufficient documentation

## 2020-10-22 LAB — ECHOCARDIOGRAM COMPLETE
Area-P 1/2: 3.17 cm2
MV M vel: 5.27 m/s
MV Peak grad: 110.9 mmHg
S' Lateral: 3.75 cm

## 2020-10-22 MED ORDER — PERFLUTREN LIPID MICROSPHERE
1.0000 mL | INTRAVENOUS | Status: AC | PRN
  Administered 2020-10-22: 3 mL via INTRAVENOUS

## 2020-10-29 ENCOUNTER — Emergency Department (HOSPITAL_COMMUNITY): Payer: Medicare HMO

## 2020-10-29 ENCOUNTER — Other Ambulatory Visit: Payer: Self-pay

## 2020-10-29 ENCOUNTER — Emergency Department (HOSPITAL_COMMUNITY)
Admission: EM | Admit: 2020-10-29 | Discharge: 2020-10-29 | Disposition: A | Discharge status: Home / Self Care | Payer: Medicare HMO | Attending: Emergency Medicine | Admitting: Emergency Medicine

## 2020-10-29 DIAGNOSIS — Z7902 Long term (current) use of antithrombotics/antiplatelets: Secondary | ICD-10-CM | POA: Insufficient documentation

## 2020-10-29 DIAGNOSIS — Z96651 Presence of right artificial knee joint: Secondary | ICD-10-CM | POA: Insufficient documentation

## 2020-10-29 DIAGNOSIS — Z87891 Personal history of nicotine dependence: Secondary | ICD-10-CM | POA: Diagnosis not present

## 2020-10-29 DIAGNOSIS — Z79899 Other long term (current) drug therapy: Secondary | ICD-10-CM | POA: Diagnosis not present

## 2020-10-29 DIAGNOSIS — Z7982 Long term (current) use of aspirin: Secondary | ICD-10-CM | POA: Diagnosis not present

## 2020-10-29 DIAGNOSIS — Z7984 Long term (current) use of oral hypoglycemic drugs: Secondary | ICD-10-CM | POA: Insufficient documentation

## 2020-10-29 DIAGNOSIS — I2510 Atherosclerotic heart disease of native coronary artery without angina pectoris: Secondary | ICD-10-CM | POA: Insufficient documentation

## 2020-10-29 DIAGNOSIS — I10 Essential (primary) hypertension: Secondary | ICD-10-CM | POA: Insufficient documentation

## 2020-10-29 DIAGNOSIS — Z794 Long term (current) use of insulin: Secondary | ICD-10-CM | POA: Insufficient documentation

## 2020-10-29 DIAGNOSIS — Z951 Presence of aortocoronary bypass graft: Secondary | ICD-10-CM | POA: Insufficient documentation

## 2020-10-29 DIAGNOSIS — H539 Unspecified visual disturbance: Secondary | ICD-10-CM | POA: Diagnosis not present

## 2020-10-29 DIAGNOSIS — E119 Type 2 diabetes mellitus without complications: Secondary | ICD-10-CM | POA: Insufficient documentation

## 2020-10-29 DIAGNOSIS — H547 Unspecified visual loss: Secondary | ICD-10-CM | POA: Diagnosis present

## 2020-10-29 LAB — BASIC METABOLIC PANEL
Anion gap: 7 (ref 5–15)
BUN: 9 mg/dL (ref 8–23)
CO2: 30 mmol/L (ref 22–32)
Calcium: 9 mg/dL (ref 8.9–10.3)
Chloride: 102 mmol/L (ref 98–111)
Creatinine, Ser: 0.8 mg/dL (ref 0.44–1.00)
GFR, Estimated: >60 mL/min (ref >60)
Glucose, Bld: 288 mg/dL — ABNORMAL HIGH (ref 70–99)
Potassium: 3 mmol/L — ABNORMAL LOW (ref 3.5–5.1)
Sodium: 139 mmol/L (ref 135–145)

## 2020-10-29 LAB — CBC
HCT: 37.6 % (ref 36.0–46.0)
Hemoglobin: 12.1 g/dL (ref 12.0–15.0)
MCH: 26.1 pg (ref 26.0–34.0)
MCHC: 32.2 g/dL (ref 30.0–36.0)
MCV: 81.2 fL (ref 80.0–100.0)
Platelets: 349 10*3/uL (ref 150–400)
RBC: 4.63 MIL/uL (ref 3.87–5.11)
RDW: 14.3 % (ref 11.5–15.5)
WBC: 11.4 10*3/uL — ABNORMAL HIGH (ref 4.0–10.5)
nRBC: 0 % (ref 0.0–0.2)

## 2020-10-29 MED ORDER — LORAZEPAM 2 MG/ML IJ SOLN: 0.5000 mg | INTRAMUSCULAR | Status: DC | PRN

## 2020-10-29 MED ORDER — POTASSIUM CHLORIDE CRYS ER 20 MEQ PO TBCR
40.0000 meq | EXTENDED_RELEASE_TABLET | Freq: Once | ORAL | Status: AC
  Administered 2020-10-29: 40 meq via ORAL
  Filled 2020-10-29: qty 2

## 2020-10-29 NOTE — ED Triage Notes (Signed)
Pt BIB EMS for c/o blurred vision in right eye that started while talking to someone outside. Pt denies vision loss. Pt states vision is better upon arrival. Hx of MI with stent placement. Takes plavix. Denies hx of stroke.

## 2020-10-29 NOTE — ED Notes (Signed)
Dr.Knapp at bedside  

## 2020-10-29 NOTE — Discharge Instructions (Addendum)
Return to the ED if you have any worsening symptoms or change your mind about the MRI testing.  Follow up with the eye doctor as we discussed

## 2020-10-29 NOTE — ED Provider Notes (Signed)
[facility] EMERGENCY DEPARTMENT Provider Note   CSN: [id number] Arrival date & time: 10/29/20  1848     History Chief Complaint  Patient presents with  . Loss of Vision    [name] is a 60 y.o. female.  HPI   Patient presented to the ED for evaluation of blurred vision.  Patient states that started about 4 PM today.  She feels like the temporal aspect of her field of vision in the right eye seems to be off.  She still is able to see objects and read.  She denies any headache.  No focal numbness or weakness.  No trouble with her speech balance or coordination.  Patient does have history of diabetes as well as coronary artery disease.  No history of prior strokes.  She denies any history of prior eye issues.  Past Medical History:  Diagnosis Date  . Arthritis   . CAD (coronary artery disease)    s/p Taxus DES to pLAD 9/07; ant STEMI 6/10 due to late stent thrombosis treated with thrombectomy and POBA to LAD (LHC: ant apical AK, Ef 40%, dLM 20-30%, pLAD occluded at prox edge of stent (treated with PCI), R-L collats, pRCA 50%, mRCA 30%.) ;Myoview done 07/19/11: mod ant apical and inf apical scar, no ischemia, EF 45%    . Diabetes mellitus   . Hyperlipidemia   . Hypertension   . Ischemic cardiomyopathy    echo 7/10: inf septal and apical HK, EF 45-50%, mild LAE.    Marland Kitchen Myocardial infarction [facility]) [id number]    Patient Active Problem List   Diagnosis Date Noted  . S/P right TKA 01/23/2012  . Chest pain 07/12/2011  . Palpitations 07/12/2011  . Hyperlipidemia 02/10/2009  . CAD, NATIVE VESSEL 02/10/2009  . CARDIOMYOPATHY, ISCHEMIC 02/10/2009  . ARTHRITIS 02/10/2009  . DIABETES MELLITUS, TYPE II 04/23/2007  . Essential hypertension 04/23/2007    Past Surgical History:  Procedure Laterality Date  . CHOLECYSTECTOMY    . CORONARY ANGIOPLASTY  [id number]  . TONSILLECTOMY    . TONSILLECTOMY     age 10  . TOTAL KNEE ARTHROPLASTY  01/23/2012   Procedure:  TOTAL KNEE ARTHROPLASTY;  Surgeon: Shelda Pal, MD;  Location: [facility]S;  Service: Orthopedics;  Laterality: Right;     OB History   No obstetric history on file.     Family History  Problem Relation Age of Onset  . Lupus Mother   . Diabetes Father   . Cancer Maternal Grandmother   . Heart disease Maternal Grandfather   . Cancer Paternal Grandmother   . Arthritis Other   . Colon cancer Other   . Diabetes Other     Social History   Tobacco Use  . Smoking status: Former Smoker    Years: 15.00    Types: Cigarettes  . Smokeless tobacco: Former Neurosurgeon    Quit date: 01/18/1991  . Tobacco comment: QUIT 21 YEARS AGO  Substance Use Topics  . Alcohol use: No  . Drug use: No    Home Medications Prior to Admission medications   Medication Sig Start Date End Date Taking? Authorizing Provider  aspirin 81 MG tablet Take 81 mg by mouth daily with breakfast.    [provider]  carvedilol (COREG) 6.25 MG tablet Take 1 tablet (6.25 mg total) by mouth 2 (two) times daily with a meal. 12/05/13   Kathleene Hazel, MD  clopidogrel (PLAVIX) 75 MG tablet TAKE 1 TABLET (75 MG TOTAL) BY MOUTH  DAILY. 04/05/17   Kathleene Hazel, MD  glyBURIDE-metformin (GLUCOVANCE) 5-500 MG per tablet Take 2 tablets by mouth 2 (two) times daily with a meal.    [provider]  insulin degludec (TRESIBA FLEXTOUCH) 200 UNIT/ML FlexTouch Pen Inject 50 Units into the skin daily.    [provider]  ONE TOUCH ULTRA TEST test strip  08/13/11   [provider]  valsartan (DIOVAN) 80 MG tablet Take 80 mg by mouth at bedtime.    [provider]    Allergies    Ozempic (0.25 or 0.5 mg-dose)  [semaglutide(0.25 or 0.5mg -dos)]  Review of Systems   Review of Systems  All other systems reviewed and are negative.   Physical Exam Updated Vital Signs BP (!) 144/65 (BP Location: Left Arm)   Pulse 87   Temp 98.2 F (36.8 C) (Oral)   Resp 16   Ht 1.626 m (5\' 4" )   Wt  113 kg   SpO2 98%   BMI 42.76 kg/m   Physical Exam Vitals and nursing note reviewed.  Constitutional:      General: She is not in acute distress.    Appearance: She is well-developed.  HENT:     Head: Normocephalic and atraumatic.     Right Ear: External ear normal.     Left Ear: External ear normal.  Eyes:     General: No scleral icterus.       Right eye: No discharge.        Left eye: No discharge.     Extraocular Movements: Extraocular movements intact.     Conjunctiva/sclera: Conjunctivae normal.     Pupils: Pupils are equal, round, and reactive to light.     Comments: Patient is able to see digits clearly with her left eye covered  Neck:     Trachea: No tracheal deviation.  Cardiovascular:     Rate and Rhythm: Normal rate and regular rhythm.  Pulmonary:     Effort: Pulmonary effort is normal. No respiratory distress.     Breath sounds: Normal breath sounds. No stridor. No wheezing or rales.  Abdominal:     General: Bowel sounds are normal. There is no distension.     Palpations: Abdomen is soft.     Tenderness: There is no abdominal tenderness. There is no guarding or rebound.  Musculoskeletal:        General: No tenderness.     Cervical back: Neck supple.  Skin:    General: Skin is warm and dry.     Findings: No rash.  Neurological:     Mental Status: She is alert and oriented to person, place, and time.     Cranial Nerves: No cranial nerve deficit (no facial droop, extraocular movements intact, no slurred speech).     Sensory: No sensory deficit.     Motor: No abnormal muscle tone or seizure activity.     Coordination: Coordination normal.     Comments: No pronator drift bilateral upper extrem, able to hold both legs off bed for 5 seconds, sensation intact in all extremities, no visual field cuts, no left or right sided neglect, normal finger-nose exam bilaterally, no nystagmus noted      ED Results / Procedures / Treatments   Labs (all labs ordered are  listed, but only abnormal results are displayed) Labs Reviewed  CBC  BASIC METABOLIC PANEL    EKG None  Radiology No results found.  Procedures Procedures   Medications Ordered in ED Medications  LORazepam (ATIVAN) injection 0.5 mg (has no administration in time range)    ED Course  I have reviewed the triage vital signs and the nursing notes.  Pertinent labs & imaging results that were available during my care of the patient were reviewed by me and considered in my medical decision making (see chart for details).    MDM Rules/Calculators/A&P                          Patient presented to the ED for evaluation of unilateral blurred vision.  Patient is actually able to distinguish my digits.  She is not having any trouble identifying objects.  Discussed proceeding with brain imaging with the patient.  Unfortunately she is extremely claustrophobic and states she cannot have an MRI even if we give her medications to sedate her.  I discussed my. concerns about the possibility of an occult stroke or something like optic neuritis causing her unilateral vision complaints.  Patient understands but states she really cannot proceed with the MRI.  We will have her follow-up with an ophthalmologist and also stated she can return at any time if she changes her mind. Final Clinical Impression(s) / ED Diagnoses Final diagnoses:  Vision disturbance    Rx / DC Orders ED Discharge Orders    None       Linwood Dibbles, MD 11/02/20 [phone]

## 2020-10-29 NOTE — ED Notes (Signed)
Pt refuses to have MRI done, even if she gets ativan prior to exam, Dr. Lynelle Doctor made aware.

## 2020-11-02 ENCOUNTER — Other Ambulatory Visit (HOSPITAL_BASED_OUTPATIENT_CLINIC_OR_DEPARTMENT_OTHER): Payer: Self-pay

## 2020-11-02 ENCOUNTER — Emergency Department (HOSPITAL_BASED_OUTPATIENT_CLINIC_OR_DEPARTMENT_OTHER): Payer: Medicare HMO

## 2020-11-02 ENCOUNTER — Other Ambulatory Visit: Payer: Self-pay

## 2020-11-02 ENCOUNTER — Inpatient Hospital Stay (HOSPITAL_BASED_OUTPATIENT_CLINIC_OR_DEPARTMENT_OTHER)
Admission: EM | Admit: 2020-11-02 | Discharge: 2020-11-05 | DRG: 040 | Disposition: A | Discharge status: Home Health | Payer: Medicare HMO | Attending: Internal Medicine | Admitting: Internal Medicine

## 2020-11-02 ENCOUNTER — Encounter (HOSPITAL_BASED_OUTPATIENT_CLINIC_OR_DEPARTMENT_OTHER): Payer: Self-pay | Admitting: Emergency Medicine

## 2020-11-02 DIAGNOSIS — Z96651 Presence of right artificial knee joint: Secondary | ICD-10-CM | POA: Diagnosis present

## 2020-11-02 DIAGNOSIS — Z79899 Other long term (current) drug therapy: Secondary | ICD-10-CM

## 2020-11-02 DIAGNOSIS — I252 Old myocardial infarction: Secondary | ICD-10-CM | POA: Diagnosis not present

## 2020-11-02 DIAGNOSIS — E876 Hypokalemia: Secondary | ICD-10-CM | POA: Diagnosis not present

## 2020-11-02 DIAGNOSIS — Z6841 Body Mass Index (BMI) 40.0 and over, adult: Secondary | ICD-10-CM

## 2020-11-02 DIAGNOSIS — Z8249 Family history of ischemic heart disease and other diseases of the circulatory system: Secondary | ICD-10-CM | POA: Diagnosis not present

## 2020-11-02 DIAGNOSIS — Z87891 Personal history of nicotine dependence: Secondary | ICD-10-CM | POA: Diagnosis not present

## 2020-11-02 DIAGNOSIS — I2510 Atherosclerotic heart disease of native coronary artery without angina pectoris: Secondary | ICD-10-CM | POA: Diagnosis not present

## 2020-11-02 DIAGNOSIS — H5461 Unqualified visual loss, right eye, normal vision left eye: Secondary | ICD-10-CM | POA: Diagnosis present

## 2020-11-02 DIAGNOSIS — R4701 Aphasia: Secondary | ICD-10-CM

## 2020-11-02 DIAGNOSIS — Z20822 Contact with and (suspected) exposure to covid-19: Secondary | ICD-10-CM | POA: Diagnosis present

## 2020-11-02 DIAGNOSIS — Z833 Family history of diabetes mellitus: Secondary | ICD-10-CM

## 2020-11-02 DIAGNOSIS — I619 Nontraumatic intracerebral hemorrhage, unspecified: Secondary | ICD-10-CM | POA: Diagnosis not present

## 2020-11-02 DIAGNOSIS — Z888 Allergy status to other drugs, medicaments and biological substances status: Secondary | ICD-10-CM

## 2020-11-02 DIAGNOSIS — H53451 Other localized visual field defect, right eye: Secondary | ICD-10-CM

## 2020-11-02 DIAGNOSIS — I110 Hypertensive heart disease with heart failure: Secondary | ICD-10-CM | POA: Diagnosis not present

## 2020-11-02 DIAGNOSIS — I5042 Chronic combined systolic (congestive) and diastolic (congestive) heart failure: Secondary | ICD-10-CM | POA: Diagnosis not present

## 2020-11-02 DIAGNOSIS — E1165 Type 2 diabetes mellitus with hyperglycemia: Secondary | ICD-10-CM | POA: Diagnosis present

## 2020-11-02 DIAGNOSIS — Z955 Presence of coronary angioplasty implant and graft: Secondary | ICD-10-CM | POA: Diagnosis not present

## 2020-11-02 DIAGNOSIS — I255 Ischemic cardiomyopathy: Secondary | ICD-10-CM | POA: Diagnosis present

## 2020-11-02 DIAGNOSIS — Z9049 Acquired absence of other specified parts of digestive tract: Secondary | ICD-10-CM | POA: Diagnosis not present

## 2020-11-02 DIAGNOSIS — Z7984 Long term (current) use of oral hypoglycemic drugs: Secondary | ICD-10-CM

## 2020-11-02 DIAGNOSIS — Q211 Atrial septal defect: Secondary | ICD-10-CM

## 2020-11-02 DIAGNOSIS — D72829 Elevated white blood cell count, unspecified: Secondary | ICD-10-CM | POA: Diagnosis not present

## 2020-11-02 DIAGNOSIS — E785 Hyperlipidemia, unspecified: Secondary | ICD-10-CM | POA: Diagnosis present

## 2020-11-02 DIAGNOSIS — I63532 Cerebral infarction due to unspecified occlusion or stenosis of left posterior cerebral artery: Secondary | ICD-10-CM | POA: Diagnosis not present

## 2020-11-02 DIAGNOSIS — I639 Cerebral infarction, unspecified: Secondary | ICD-10-CM | POA: Diagnosis present

## 2020-11-02 DIAGNOSIS — Z981 Arthrodesis status: Secondary | ICD-10-CM

## 2020-11-02 DIAGNOSIS — I10 Essential (primary) hypertension: Secondary | ICD-10-CM | POA: Diagnosis present

## 2020-11-02 DIAGNOSIS — E1169 Type 2 diabetes mellitus with other specified complication: Secondary | ICD-10-CM | POA: Diagnosis present

## 2020-11-02 DIAGNOSIS — Z7982 Long term (current) use of aspirin: Secondary | ICD-10-CM

## 2020-11-02 DIAGNOSIS — Z7902 Long term (current) use of antithrombotics/antiplatelets: Secondary | ICD-10-CM

## 2020-11-02 LAB — CBC WITH DIFFERENTIAL/PLATELET
Abs Immature Granulocytes: 0.02 10*3/uL (ref 0.00–0.07)
Basophils Absolute: 0 10*3/uL (ref 0.0–0.1)
Basophils Relative: 0 %
Eosinophils Absolute: 0.1 10*3/uL (ref 0.0–0.5)
Eosinophils Relative: 1 %
HCT: 38.7 % (ref 36.0–46.0)
Hemoglobin: 12.4 g/dL (ref 12.0–15.0)
Immature Granulocytes: 0 %
Lymphocytes Relative: 25 %
Lymphs Abs: 2.9 10*3/uL (ref 0.7–4.0)
MCH: 25.9 pg — ABNORMAL LOW (ref 26.0–34.0)
MCHC: 32 g/dL (ref 30.0–36.0)
MCV: 81 fL (ref 80.0–100.0)
Monocytes Absolute: 0.8 10*3/uL (ref 0.1–1.0)
Monocytes Relative: 7 %
Neutro Abs: 7.6 10*3/uL (ref 1.7–7.7)
Neutrophils Relative %: 67 %
Platelets: 343 10*3/uL (ref 150–400)
RBC: 4.78 MIL/uL (ref 3.87–5.11)
RDW: 14.4 % (ref 11.5–15.5)
WBC: 11.4 10*3/uL — ABNORMAL HIGH (ref 4.0–10.5)
nRBC: 0 % (ref 0.0–0.2)

## 2020-11-02 LAB — COMPREHENSIVE METABOLIC PANEL
ALT: 12 U/L (ref 0–44)
AST: 17 U/L (ref 15–41)
Albumin: 3.1 g/dL — ABNORMAL LOW (ref 3.5–5.0)
Alkaline Phosphatase: 68 U/L (ref 38–126)
Anion gap: 10 (ref 5–15)
BUN: 10 mg/dL (ref 8–23)
CO2: 26 mmol/L (ref 22–32)
Calcium: 8.7 mg/dL — ABNORMAL LOW (ref 8.9–10.3)
Chloride: 100 mmol/L (ref 98–111)
Creatinine, Ser: 0.75 mg/dL (ref 0.44–1.00)
GFR, Estimated: >60 mL/min (ref >60)
Glucose, Bld: 258 mg/dL — ABNORMAL HIGH (ref 70–99)
Potassium: 3 mmol/L — ABNORMAL LOW (ref 3.5–5.1)
Sodium: 136 mmol/L (ref 135–145)
Total Bilirubin: 0.4 mg/dL (ref 0.3–1.2)
Total Protein: 7 g/dL (ref 6.5–8.1)

## 2020-11-02 LAB — URINALYSIS, ROUTINE W REFLEX MICROSCOPIC
Bilirubin Urine: NEGATIVE
Glucose, UA: 250 mg/dL — AB
Hgb urine dipstick: NEGATIVE
Ketones, ur: NEGATIVE mg/dL
Nitrite: NEGATIVE
Protein, ur: NEGATIVE mg/dL
Specific Gravity, Urine: 1.015 (ref 1.005–1.030)
pH: 6 (ref 5.0–8.0)

## 2020-11-02 LAB — URINALYSIS, MICROSCOPIC (REFLEX)

## 2020-11-02 LAB — RESP PANEL BY RT-PCR (FLU A&B, COVID) ARPGX2
Influenza A by PCR: NEGATIVE
Influenza B by PCR: NEGATIVE
SARS Coronavirus 2 by RT PCR: NEGATIVE

## 2020-11-02 LAB — TSH: TSH: 3.097 u[IU]/mL (ref 0.350–4.500)

## 2020-11-02 LAB — PROTIME-INR
INR: 1 (ref 0.8–1.2)
Prothrombin Time: 12.8 seconds (ref 11.4–15.2)

## 2020-11-02 MED ORDER — IOHEXOL 350 MG/ML SOLN
100.0000 mL | Freq: Once | INTRAVENOUS | Status: AC | PRN
  Administered 2020-11-02: 100 mL via INTRAVENOUS

## 2020-11-02 NOTE — ED Notes (Signed)
ED Provider at bedside. 

## 2020-11-02 NOTE — ED Triage Notes (Signed)
Daughter reports patients has had issues with peripheral vision since Friday with having difficulty finding words.  Reports this has gotten worse since Friday.

## 2020-11-02 NOTE — ED Provider Notes (Signed)
[facility] EMERGENCY DEPARTMENT Provider Note   CSN: [id number] Arrival date & time: 11/02/20  1558     History Chief Complaint  Patient presents with  . Blurred Vision    [name] is a 60 y.o. female.  The history is provided by the patient and medical records. No language interpreter was used.  Neurologic Problem This is a new problem. The current episode started more than 2 days ago. The problem occurs constantly. The problem has not changed since onset.Pertinent negatives include no chest pain, no abdominal pain, no headaches and no shortness of breath. Nothing aggravates the symptoms. Nothing relieves the symptoms. She has tried nothing for the symptoms. The treatment provided no relief.       Past Medical History:  Diagnosis Date  . Arthritis   . CAD (coronary artery disease)    s/p Taxus DES to pLAD 9/07; ant STEMI 6/10 due to late stent thrombosis treated with thrombectomy and POBA to LAD (LHC: ant apical AK, Ef 40%, dLM 20-30%, pLAD occluded at prox edge of stent (treated with PCI), R-L collats, pRCA 50%, mRCA 30%.) ;Myoview done 07/19/11: mod ant apical and inf apical scar, no ischemia, EF 45%    . Diabetes mellitus   . Hyperlipidemia   . Hypertension   . Ischemic cardiomyopathy    echo 7/10: inf septal and apical HK, EF 45-50%, mild LAE.    Marland Kitchen Myocardial infarction [facility]) [phone]    Patient Active Problem List   Diagnosis Date Noted  . S/P right TKA 01/23/2012  . Chest pain 07/12/2011  . Palpitations 07/12/2011  . Hyperlipidemia 02/10/2009  . CAD, NATIVE VESSEL 02/10/2009  . CARDIOMYOPATHY, ISCHEMIC 02/10/2009  . ARTHRITIS 02/10/2009  . DIABETES MELLITUS, TYPE II 04/23/2007  . Essential hypertension 04/23/2007    Past Surgical History:  Procedure Laterality Date  . CHOLECYSTECTOMY    . CORONARY ANGIOPLASTY  [id number],[id number]  . TONSILLECTOMY    . TONSILLECTOMY     age 89  . TOTAL KNEE ARTHROPLASTY  01/23/2012   Procedure: TOTAL KNEE  ARTHROPLASTY;  Surgeon: Shelda Pal, MD;  Location: [facility];  Service: Orthopedics;  Laterality: Right;     OB History   No obstetric history on file.     Family History  Problem Relation Age of Onset  . Lupus Mother   . Diabetes Father   . Cancer Maternal Grandmother   . Heart disease Maternal Grandfather   . Cancer Paternal Grandmother   . Arthritis Other   . Colon cancer Other   . Diabetes Other     Social History   Tobacco Use  . Smoking status: Former Smoker    Years: 15.00    Types: Cigarettes  . Smokeless tobacco: Former Neurosurgeon    Quit date: 01/18/1991  . Tobacco comment: QUIT 21 YEARS AGO  Substance Use Topics  . Alcohol use: No  . Drug use: No    Home Medications Prior to Admission medications   Medication Sig Start Date End Date Taking? Authorizing Provider  aspirin 81 MG tablet Take 81 mg by mouth daily with breakfast.    [provider]  carvedilol (COREG) 6.25 MG tablet Take 1 tablet (6.25 mg total) by mouth 2 (two) times daily with a meal. 12/05/13   Kathleene Hazel, MD  clopidogrel (PLAVIX) 75 MG tablet TAKE 1 TABLET (75 MG TOTAL) BY MOUTH DAILY. 04/05/17   Kathleene Hazel, MD  glyBURIDE-metformin (GLUCOVANCE) 5-500 MG per tablet Take 2 tablets by  mouth 2 (two) times daily with a meal.    [provider]  insulin degludec (TRESIBA FLEXTOUCH) 200 UNIT/ML FlexTouch Pen Inject 50 Units into the skin daily.    [provider]  ONE TOUCH ULTRA TEST test strip  08/13/11   [provider]  valsartan (DIOVAN) 80 MG tablet Take 80 mg by mouth at bedtime.    [provider]    Allergies    Ozempic (0.25 or 0.5 mg-dose)  [semaglutide(0.25 or 0.5mg -dos)]  Review of Systems   Review of Systems  Constitutional: Negative for chills, diaphoresis, fatigue and fever.  HENT: Negative for congestion.   Eyes: Positive for visual disturbance. Negative for photophobia.  Respiratory: Negative for cough, chest  tightness, shortness of breath and wheezing.   Cardiovascular: Negative for chest pain, palpitations and leg swelling.  Gastrointestinal: Negative for abdominal pain, constipation, diarrhea, nausea and vomiting.  Genitourinary: Negative for flank pain and frequency.  Musculoskeletal: Negative for back pain, neck pain and neck stiffness.  Skin: Negative for rash and wound.  Neurological: Positive for speech difficulty. Negative for dizziness, tremors, syncope, facial asymmetry, weakness, light-headedness, numbness and headaches.  Psychiatric/Behavioral: Negative for agitation and confusion.  All other systems reviewed and are negative.   Physical Exam Updated Vital Signs BP (!) 149/77 (BP Location: Right Arm)   Pulse 85   Temp 98.1 F (36.7 C) (Oral)   Resp 16   Ht 5\' 4"  (1.626 m)   Wt 113 kg   SpO2 99%   BMI 42.76 kg/m   Physical Exam Vitals and nursing note reviewed.  Constitutional:      General: She is not in acute distress.    Appearance: She is well-developed. She is not ill-appearing, toxic-appearing or diaphoretic.  HENT:     Head: Normocephalic and atraumatic.     Nose: No congestion or rhinorrhea.     Mouth/Throat:     Mouth: Mucous membranes are moist.     Pharynx: No oropharyngeal exudate or posterior oropharyngeal erythema.  Eyes:     General: Visual field deficit present.     Conjunctiva/sclera: Conjunctivae normal.     Pupils: Pupils are equal, round, and reactive to light.  Cardiovascular:     Rate and Rhythm: Normal rate and regular rhythm.     Heart sounds: No murmur heard.   Pulmonary:     Effort: Pulmonary effort is normal. No respiratory distress.     Breath sounds: Normal breath sounds.  Abdominal:     Palpations: Abdomen is soft.     Tenderness: There is no abdominal tenderness. There is no right CVA tenderness, left CVA tenderness, guarding or rebound.  Musculoskeletal:        General: No tenderness.     Cervical back: Neck supple.     Right  lower leg: No edema.     Left lower leg: No edema.  Skin:    General: Skin is warm and dry.     Capillary Refill: Capillary refill takes less than 2 seconds.     Findings: No erythema.  Neurological:     General: No focal deficit present.     Mental Status: She is alert.     GCS: GCS eye subscore is 4. GCS verbal subscore is 5. GCS motor subscore is 6.     Cranial Nerves: No dysarthria or facial asymmetry.     Sensory: No sensory deficit.     Motor: No weakness or abnormal muscle tone.     Coordination:  Coordination normal. Finger-Nose-Finger Test normal.     Comments: Patient has aphasia on exam.  Patient also has right-sided visual field deficits.  Psychiatric:        Mood and Affect: Mood normal.     ED Results / Procedures / Treatments   Labs (all labs ordered are listed, but only abnormal results are displayed) Labs Reviewed  CBC WITH DIFFERENTIAL/PLATELET - Abnormal; Notable for the following components:      Result Value   WBC 11.4 (*)    MCH 25.9 (*)    All other components within normal limits  COMPREHENSIVE METABOLIC PANEL - Abnormal; Notable for the following components:   Potassium 3.0 (*)    Glucose, Bld 258 (*)    Calcium 8.7 (*)    Albumin 3.1 (*)    All other components within normal limits  URINALYSIS, ROUTINE W REFLEX MICROSCOPIC - Abnormal; Notable for the following components:   APPearance CLOUDY (*)    Glucose, UA 250 (*)    Leukocytes,Ua SMALL (*)    All other components within normal limits  URINALYSIS, MICROSCOPIC (REFLEX) - Abnormal; Notable for the following components:   Bacteria, UA MANY (*)    All other components within normal limits  URINE CULTURE  PROTIME-INR  TSH    EKG None  Radiology CT Angio Head W or Wo Contrast  Result Date: 11/02/2020 CLINICAL DATA:  Neuro deficit, acute, stroke suspected; right-sided blurriness and speech difficulty for the last 4 days. EXAM: CT ANGIOGRAPHY HEAD AND NECK TECHNIQUE: Multidetector CT imaging  of the head and neck was performed using the standard protocol during bolus administration of intravenous contrast. Multiplanar CT image reconstructions and MIPs were obtained to evaluate the vascular anatomy. Carotid stenosis measurements (when applicable) are obtained utilizing NASCET criteria, using the distal internal carotid diameter as the denominator. CONTRAST:  OMNIPAQUE IOHEXOL 350 MG/ML SOLN COMPARISON:  No pertinent prior exams available for comparison. FINDINGS: CT HEAD FINDINGS Brain: Please note portions of the cerebellum and temporal lobes are excluded from the field of view inferiorly. Mild cerebral and cerebellar atrophy. Abnormal hypodensity within the left temporal occipital lobes (PCA vascular territory) compatible with acute/early subacute cortical/subcortical infarction. This measures 5.6 x 2.0 cm in transaxial dimensions. There is mild hyperdensity within the infarction territory likely affecting petechial hemorrhage. Background patchy and ill-defined hypoattenuation within cerebral white matter which is nonspecific, but compatible with chronic small vessel ischemic disease. Chronic right thalamic lacunar infarct No extra-axial fluid collection. No evidence of intracranial mass. No midline shift. Vascular: No hyperdense vessel. Skull: Negative for fracture or focal suspicious osseous lesion at the imaged levels. Sinuses: No significant paranasal sinus disease at the imaged levels. Orbits: No orbital mass or acute finding. Review of the MIP images confirms the above findings CTA NECK FINDINGS Aortic arch: Standard aortic branching. The visualized aortic arch is normal in caliber. No hemodynamically significant innominate or proximal subclavian artery stenosis. Right carotid system: CCA and ICA patent within the neck without stenosis. No significant atherosclerotic disease. Left carotid system: CCA and ICA patent within the neck without stenosis. No significant atherosclerotic disease.  Vertebral arteries: Vertebral arteries patent within neck without stenosis. Left vertebral artery dominant. Skeleton: No acute bony abnormality or aggressive osseous lesion. Cervical spondylosis with multilevel disc space narrowing, disc bulges, central disc protrusions, endplate spurring and uncovertebral hypertrophy. Apparent fusion across the C6-C7 disc space. Additionally, disc space narrowing is severe at C7-T1. Prominent multilevel bridging ventral osteophytes. Other neck: 10 mm left thyroid lobe  nodule not meeting consensus criteria for ultrasound follow-up. No neck mass or cervical lymphadenopathy Upper chest: No consolidation within the imaged lung apices. Review of the MIP images confirms the above findings CTA HEAD FINDINGS Anterior circulation: The intracranial internal carotid arteries are patent. Calcified plaque within both vessels with no more than mild stenosis. The M1 middle cerebral arteries are patent. No M2 proximal branch occlusion or high-grade proximal stenosis is identified. The anterior cerebral arteries are patent. Posterior circulation: The intracranial vertebral arteries are patent. The basilar artery is patent. The posterior cerebral arteries are patent proximally. Portions of the left posterior cerebral artery are poorly delineated beyond the P2/P3 junction and this portion of the vessel may be occluded. Posterior communicating arteries are present bilaterally. Venous sinuses: Within the limitations of contrast timing, no convincing thrombus. Anatomic variants: None significant Review of the MIP images confirms the above findings These results were called by telephone at the time of interpretation on 11/02/2020 at 6:03 pm to provider [facility] , who verbally acknowledged these results. IMPRESSION: CT head: 1. Please note portions of the cerebellum and temporal lobes are excluded from the field of view inferiorly 2. Acute/early subacute left temporal occipital PCA territory  infarct with mild petechial hemorrhage. 3. Background cerebral white matter chronic small vessel ischemic disease. 4. Chronic right thalamic lacunar infarct 5. Mild generalized parenchymal atrophy. CTA neck: 1. The bilateral common carotid, internal carotid and vertebral arteries are patent within the neck without stenosis. No significant atherosclerotic disease. 2. Cervical spondylosis and levels of fusion, as described. CTA head: 1. The left posterior cerebral artery is patent proximally. However, portions of the left posterior cerebral artery are poorly delineated beyond the P2/P3 junction and this portion of the vessel may be occluded. 2. Elsewhere, no intracranial large vessel occlusion or proximal high-grade arterial stenosis is identified. 3. Calcified plaque within the intracranial internal carotid arteries bilaterally with no more than mild stenosis. Electronically Signed   By: Jackey Loge DO   On: 11/02/2020 18:05   CT Angio Neck W and/or Wo Contrast  Result Date: 11/02/2020 CLINICAL DATA:  Neuro deficit, acute, stroke suspected; right-sided blurriness and speech difficulty for the last 4 days. EXAM: CT ANGIOGRAPHY HEAD AND NECK TECHNIQUE: Multidetector CT imaging of the head and neck was performed using the standard protocol during bolus administration of intravenous contrast. Multiplanar CT image reconstructions and MIPs were obtained to evaluate the vascular anatomy. Carotid stenosis measurements (when applicable) are obtained utilizing NASCET criteria, using the distal internal carotid diameter as the denominator. CONTRAST:  OMNIPAQUE IOHEXOL 350 MG/ML SOLN COMPARISON:  No pertinent prior exams available for comparison. FINDINGS: CT HEAD FINDINGS Brain: Please note portions of the cerebellum and temporal lobes are excluded from the field of view inferiorly. Mild cerebral and cerebellar atrophy. Abnormal hypodensity within the left temporal occipital lobes (PCA vascular territory) compatible  with acute/early subacute cortical/subcortical infarction. This measures 5.6 x 2.0 cm in transaxial dimensions. There is mild hyperdensity within the infarction territory likely affecting petechial hemorrhage. Background patchy and ill-defined hypoattenuation within cerebral white matter which is nonspecific, but compatible with chronic small vessel ischemic disease. Chronic right thalamic lacunar infarct No extra-axial fluid collection. No evidence of intracranial mass. No midline shift. Vascular: No hyperdense vessel. Skull: Negative for fracture or focal suspicious osseous lesion at the imaged levels. Sinuses: No significant paranasal sinus disease at the imaged levels. Orbits: No orbital mass or acute finding. Review of the MIP images confirms the above findings CTA NECK  FINDINGS Aortic arch: Standard aortic branching. The visualized aortic arch is normal in caliber. No hemodynamically significant innominate or proximal subclavian artery stenosis. Right carotid system: CCA and ICA patent within the neck without stenosis. No significant atherosclerotic disease. Left carotid system: CCA and ICA patent within the neck without stenosis. No significant atherosclerotic disease. Vertebral arteries: Vertebral arteries patent within neck without stenosis. Left vertebral artery dominant. Skeleton: No acute bony abnormality or aggressive osseous lesion. Cervical spondylosis with multilevel disc space narrowing, disc bulges, central disc protrusions, endplate spurring and uncovertebral hypertrophy. Apparent fusion across the C6-C7 disc space. Additionally, disc space narrowing is severe at C7-T1. Prominent multilevel bridging ventral osteophytes. Other neck: 10 mm left thyroid lobe nodule not meeting consensus criteria for ultrasound follow-up. No neck mass or cervical lymphadenopathy Upper chest: No consolidation within the imaged lung apices. Review of the MIP images confirms the above findings CTA HEAD FINDINGS Anterior  circulation: The intracranial internal carotid arteries are patent. Calcified plaque within both vessels with no more than mild stenosis. The M1 middle cerebral arteries are patent. No M2 proximal branch occlusion or high-grade proximal stenosis is identified. The anterior cerebral arteries are patent. Posterior circulation: The intracranial vertebral arteries are patent. The basilar artery is patent. The posterior cerebral arteries are patent proximally. Portions of the left posterior cerebral artery are poorly delineated beyond the P2/P3 junction and this portion of the vessel may be occluded. Posterior communicating arteries are present bilaterally. Venous sinuses: Within the limitations of contrast timing, no convincing thrombus. Anatomic variants: None significant Review of the MIP images confirms the above findings These results were called by telephone at the time of interpretation on 11/02/2020 at 6:03 pm to provider [facility] , who verbally acknowledged these results. IMPRESSION: CT head: 1. Please note portions of the cerebellum and temporal lobes are excluded from the field of view inferiorly 2. Acute/early subacute left temporal occipital PCA territory infarct with mild petechial hemorrhage. 3. Background cerebral white matter chronic small vessel ischemic disease. 4. Chronic right thalamic lacunar infarct 5. Mild generalized parenchymal atrophy. CTA neck: 1. The bilateral common carotid, internal carotid and vertebral arteries are patent within the neck without stenosis. No significant atherosclerotic disease. 2. Cervical spondylosis and levels of fusion, as described. CTA head: 1. The left posterior cerebral artery is patent proximally. However, portions of the left posterior cerebral artery are poorly delineated beyond the P2/P3 junction and this portion of the vessel may be occluded. 2. Elsewhere, no intracranial large vessel occlusion or proximal high-grade arterial stenosis is identified.  3. Calcified plaque within the intracranial internal carotid arteries bilaterally with no more than mild stenosis. Electronically Signed   By: Jackey Loge DO   On: 11/02/2020 18:05    Procedures Procedures   Medications Ordered in ED Medications   stroke: mapping our early stages of recovery book (has no administration in time range)  acetaminophen (TYLENOL) tablet 650 mg (has no administration in time range)    Or  acetaminophen (TYLENOL) 160 MG/5ML solution 650 mg (has no administration in time range)    Or  acetaminophen (TYLENOL) suppository 650 mg (has no administration in time range)  enoxaparin (LOVENOX) injection 40 mg (has no administration in time range)  iohexol (OMNIPAQUE) 350 MG/ML injection 100 mL (100 mLs Intravenous Contrast Given 11/02/20 1714)    ED Course  I have reviewed the triage vital signs and the nursing notes.  Pertinent labs & imaging results that were available during my care of the patient  were reviewed by me and considered in my medical decision making (see chart for details).    MDM Rules/Calculators/A&P                          [name] is a 60 y.o. female with a past medical history significant for hypertension, hyperlipidemia, diabetes, CAD with prior MI, and ischemic cardiomyopathy who presents with worsening right-sided vision blurriness and speech difficulties.  Patient reports that 4 days ago, she started having some vision changes in her right side of her vision.  She initially came to emergency department and the previous provider was concerned about possible stroke.  He recommended imaging of her head but she reported claustrophobia and refused imaging at that time.  Patient then went home and was going to follow-up with ophthalmology.  Since leaving, she started developing speech difficulties and has been having aphasia for the last 3 and half days.  She now is having difficulty getting out what she wants to say and is continuing to have  right-sided vision changes.  She reports that the speech is waxes and wanes but has been persistent since yesterday.  She denies dizziness or headaches.  She denies neck pain or neck stiffness.  She denies nausea, vomiting constipation, diarrhea, urinary changes.  Denies fevers or chills peer denies any Covid symptoms.  Denies any numbness or weakness in any extremities and she reports chronic neuropathy from her diabetes troubles.  On exam, lungs are clear and chest is nontender.  Abdomen is nontender.  Normal sensation and strength in extremities.  Normal finger-nose-finger testing bilaterally.  Symmetric smile.  Normal sensation of the face.  Patient did have some aphasia when trying to explain answers during the history gathering.  She also had right-sided visual field deficits in both eyes.  Pupils are symmetric and reactive normal extraocular was.  No diplopia reported.  Exam otherwise unremarkable.  Clinically I do suspect she has a left-sided stroke for the last 4 days causing her symptoms.  I called the teleneurology team to discuss what imaging would be most appropriate given the timeline of her abnormalities and that we do not have MRI at this facility today.  Dr. Iver NestleBhagat with neurology recommended CT with CTA of the head and neck if her creatinine can tolerate it and if it shows abnormality, anticipate direct admission to medicine service for MRI and further stroke work-up.  If the imaging is reassuring, patient would likely need ED to ED transfer for MRI with potential subsequent neurology evaluation and admission after at that point.  We will order the screening labs and reassess.  Patient symptoms have been persistent and unchanging today.     5:08 PM Creatinine returned and is nonelevated.  Will order the CTA of the head and neck as neurology recommended.  We will reassess after other labs and imaging are completed to discuss disposition.  CT scan returned showing patient does indeed  have a acute to subacute stroke in the left PCA territory.  This likely explains her right peripheral vision problems and the speech difficulty.  The plan outlined with the teleneurologist team was [facility] where the inpatient neurology team can be called and continue further work-up with likely MRI and further stroke work-up.   Final Clinical Impression(s) / ED Diagnoses Final diagnoses:  Cerebrovascular accident (CVA), unspecified mechanism (HCC)  Abnormal peripheral vision, right  Aphasia     Clinical Impression:  1. Cerebrovascular accident (CVA), unspecified mechanism (HCC)   2. Abnormal peripheral vision, right   3. Aphasia     Disposition: Admit  This note was prepared with assistance of Dragon voice recognition software. Occasional wrong-word or sound-a-like substitutions may have occurred due to the inherent limitations of voice recognition software.     Staci Dack, Canary Brim, MD 11/03/20 [phone]

## 2020-11-03 ENCOUNTER — Observation Stay (HOSPITAL_COMMUNITY): Payer: Medicare HMO

## 2020-11-03 ENCOUNTER — Encounter (HOSPITAL_COMMUNITY): Payer: Self-pay | Admitting: *Deleted

## 2020-11-03 DIAGNOSIS — R4701 Aphasia: Secondary | ICD-10-CM | POA: Diagnosis not present

## 2020-11-03 DIAGNOSIS — E782 Mixed hyperlipidemia: Secondary | ICD-10-CM | POA: Diagnosis not present

## 2020-11-03 DIAGNOSIS — E1169 Type 2 diabetes mellitus with other specified complication: Secondary | ICD-10-CM | POA: Diagnosis not present

## 2020-11-03 DIAGNOSIS — I639 Cerebral infarction, unspecified: Secondary | ICD-10-CM | POA: Diagnosis not present

## 2020-11-03 DIAGNOSIS — I10 Essential (primary) hypertension: Secondary | ICD-10-CM | POA: Diagnosis not present

## 2020-11-03 DIAGNOSIS — I2510 Atherosclerotic heart disease of native coronary artery without angina pectoris: Secondary | ICD-10-CM | POA: Diagnosis not present

## 2020-11-03 LAB — CBC WITH DIFFERENTIAL/PLATELET
Abs Immature Granulocytes: 0.03 10*3/uL (ref 0.00–0.07)
Basophils Absolute: 0 10*3/uL (ref 0.0–0.1)
Basophils Relative: 0 %
Eosinophils Absolute: 0.1 10*3/uL (ref 0.0–0.5)
Eosinophils Relative: 1 %
HCT: 38.9 % (ref 36.0–46.0)
Hemoglobin: 12 g/dL (ref 12.0–15.0)
Immature Granulocytes: 0 %
Lymphocytes Relative: 32 %
Lymphs Abs: 3.5 10*3/uL (ref 0.7–4.0)
MCH: 25.7 pg — ABNORMAL LOW (ref 26.0–34.0)
MCHC: 30.8 g/dL (ref 30.0–36.0)
MCV: 83.3 fL (ref 80.0–100.0)
Monocytes Absolute: 0.6 10*3/uL (ref 0.1–1.0)
Monocytes Relative: 6 %
Neutro Abs: 6.6 10*3/uL (ref 1.7–7.7)
Neutrophils Relative %: 61 %
Platelets: 350 10*3/uL (ref 150–400)
RBC: 4.67 MIL/uL (ref 3.87–5.11)
RDW: 14.6 % (ref 11.5–15.5)
WBC: 10.9 10*3/uL — ABNORMAL HIGH (ref 4.0–10.5)
nRBC: 0 % (ref 0.0–0.2)

## 2020-11-03 LAB — BASIC METABOLIC PANEL
Anion gap: 8 (ref 5–15)
BUN: 6 mg/dL — ABNORMAL LOW (ref 8–23)
CO2: 27 mmol/L (ref 22–32)
Calcium: 8.9 mg/dL (ref 8.9–10.3)
Chloride: 104 mmol/L (ref 98–111)
Creatinine, Ser: 0.59 mg/dL (ref 0.44–1.00)
GFR, Estimated: >60 mL/min (ref >60)
Glucose, Bld: 109 mg/dL — ABNORMAL HIGH (ref 70–99)
Potassium: 2.9 mmol/L — ABNORMAL LOW (ref 3.5–5.1)
Sodium: 139 mmol/L (ref 135–145)

## 2020-11-03 LAB — LIPID PANEL
Cholesterol: 144 mg/dL (ref 0–200)
HDL: 39 mg/dL — ABNORMAL LOW (ref >40)
LDL Cholesterol: 85 mg/dL (ref 0–99)
Total CHOL/HDL Ratio: 3.7 RATIO
Triglycerides: 100 mg/dL (ref <150)
VLDL: 20 mg/dL (ref 0–40)

## 2020-11-03 LAB — GLUCOSE, CAPILLARY
Glucose-Capillary: 119 mg/dL — ABNORMAL HIGH (ref 70–99)
Glucose-Capillary: 131 mg/dL — ABNORMAL HIGH (ref 70–99)
Glucose-Capillary: 153 mg/dL — ABNORMAL HIGH (ref 70–99)
Glucose-Capillary: 247 mg/dL — ABNORMAL HIGH (ref 70–99)
Glucose-Capillary: 240 mg/dL — ABNORMAL HIGH (ref 70–99)

## 2020-11-03 LAB — RAPID URINE DRUG SCREEN, HOSP PERFORMED
Amphetamines: NOT DETECTED
Barbiturates: NOT DETECTED
Benzodiazepines: NOT DETECTED
Cocaine: NOT DETECTED
Opiates: NOT DETECTED
Tetrahydrocannabinol: NOT DETECTED

## 2020-11-03 LAB — HEMOGLOBIN A1C
Hgb A1c MFr Bld: 10.8 % — ABNORMAL HIGH (ref 4.8–5.6)
Mean Plasma Glucose: 263.26 mg/dL

## 2020-11-03 LAB — MAGNESIUM: Magnesium: 1.7 mg/dL (ref 1.7–2.4)

## 2020-11-03 MED ORDER — TICAGRELOR 90 MG PO TABS
90.0000 mg | ORAL_TABLET | Freq: Two times a day (BID) | ORAL | Status: DC
  Administered 2020-11-03 – 2020-11-05 (×5): 90 mg via ORAL
  Filled 2020-11-03 (×5): qty 1

## 2020-11-03 MED ORDER — LORAZEPAM 2 MG/ML IJ SOLN
0.5000 mg | Freq: Once | INTRAMUSCULAR | Status: AC
  Administered 2020-11-03: 0.5 mg via INTRAVENOUS
  Filled 2020-11-03: qty 1

## 2020-11-03 MED ORDER — INSULIN ASPART 100 UNIT/ML ~~LOC~~ SOLN
0.0000 [IU] | Freq: Three times a day (TID) | SUBCUTANEOUS | Status: DC
  Administered 2020-11-03: 3 [IU] via SUBCUTANEOUS
  Administered 2020-11-03 – 2020-11-04 (×2): 5 [IU] via SUBCUTANEOUS
  Administered 2020-11-04: 8 [IU] via SUBCUTANEOUS

## 2020-11-03 MED ORDER — POTASSIUM CHLORIDE CRYS ER 20 MEQ PO TBCR
40.0000 meq | EXTENDED_RELEASE_TABLET | Freq: Once | ORAL | Status: AC
  Administered 2020-11-03: 40 meq via ORAL
  Filled 2020-11-03: qty 2

## 2020-11-03 MED ORDER — EZETIMIBE 10 MG PO TABS
10.0000 mg | ORAL_TABLET | Freq: Every day | ORAL | Status: DC
  Administered 2020-11-03 – 2020-11-05 (×3): 10 mg via ORAL
  Filled 2020-11-03 (×3): qty 1

## 2020-11-03 MED ORDER — ASPIRIN EC 81 MG PO TBEC
81.0000 mg | DELAYED_RELEASE_TABLET | Freq: Every day | ORAL | Status: DC
  Administered 2020-11-03 – 2020-11-05 (×3): 81 mg via ORAL
  Filled 2020-11-03 (×3): qty 1

## 2020-11-03 MED ORDER — POTASSIUM CHLORIDE 10 MEQ/100ML IV SOLN
10.0000 meq | Freq: Once | INTRAVENOUS | Status: AC
  Administered 2020-11-03: 10 meq via INTRAVENOUS
  Filled 2020-11-03: qty 100

## 2020-11-03 MED ORDER — ENOXAPARIN SODIUM 40 MG/0.4ML ~~LOC~~ SOLN
40.0000 mg | Freq: Every day | SUBCUTANEOUS | Status: DC
  Administered 2020-11-03 – 2020-11-05 (×3): 40 mg via SUBCUTANEOUS
  Filled 2020-11-03 (×3): qty 0.4

## 2020-11-03 MED ORDER — ACETAMINOPHEN 160 MG/5ML PO SOLN: 650.0000 mg | ORAL | Status: DC | PRN

## 2020-11-03 MED ORDER — LIVING WELL WITH DIABETES BOOK
Freq: Once | Status: AC
  Filled 2020-11-03: qty 1

## 2020-11-03 MED ORDER — INSULIN GLARGINE 100 UNIT/ML ~~LOC~~ SOLN
20.0000 [IU] | Freq: Every day | SUBCUTANEOUS | Status: DC
  Administered 2020-11-03: 20 [IU] via SUBCUTANEOUS
  Filled 2020-11-03 (×2): qty 0.2

## 2020-11-03 MED ORDER — INSULIN ASPART 100 UNIT/ML ~~LOC~~ SOLN
0.0000 [IU] | Freq: Every day | SUBCUTANEOUS | Status: DC
  Administered 2020-11-03: 2 [IU] via SUBCUTANEOUS
  Administered 2020-11-04: 4 [IU] via SUBCUTANEOUS

## 2020-11-03 MED ORDER — ACETAMINOPHEN 650 MG RE SUPP: 650.0000 mg | RECTAL | Status: DC | PRN

## 2020-11-03 MED ORDER — STROKE: EARLY STAGES OF RECOVERY BOOK
Freq: Once | Status: AC
  Filled 2020-11-03: qty 1

## 2020-11-03 MED ORDER — ACETAMINOPHEN 325 MG PO TABS
650.0000 mg | ORAL_TABLET | ORAL | Status: DC | PRN
  Filled 2020-11-03: qty 2

## 2020-11-03 NOTE — H&P (Addendum)
History and Physical    [name] ZOX:[id number] DOB: 08/02/1961 DOA: 11/02/2020  PCP: [name], MD  Patient coming from: Home  I have personally briefly reviewed patient's old medical records in [facility]  Chief Complaint: right peripheral vision loss and aphasia  HPI: [name] is a 60 y.o. female with medical history significant for CAD s/p stent , HTN, HLD and Type 2 DM who presents as a transfer from [facility] with 4 days of right peripheral vision loss and new onset aphasia.   Patient had difficulty recalling history given aphasia and difficulty with word finding.  History mostly obtained from documentation that was given by her sister who is not at bedside at this time.  Patient is right-handed and was seen for her decreased right peripheral vision vision on 3/25 in the ED but due to extreme claustrophobia to MRI she declined further work-up and wanted follow-up with her ophthalmologist. She presented back to the ED today because of the addition of new onset aphasia and difficulty word finding.  ED Course: She was afebrile and hypertensive up to 160s systolic. WBC of 11.4.  Potassium of 3.  BG of 258.  EDP Dr. Julieanne Manson spoke with Tele neuro Dr. Iver Nestle and obtained CTA head and neck which showed acute/early subactue left temporal occipital PCA territory infarct with mild petechial hemorrhage. Parts of left posterior cerebral artery may also be occluded. Pt already on aspirin and plavix for her CAD stent.    Review of Systems: Unable to fully obtain due to patient's dysarthria and trouble word finding  Past Medical History:  Diagnosis Date  . Arthritis   . CAD (coronary artery disease)    s/p Taxus DES to pLAD 9/07; ant STEMI 6/10 due to late stent thrombosis treated with thrombectomy and POBA to LAD (LHC: ant apical AK, Ef 40%, dLM 20-30%, pLAD occluded at prox edge of stent (treated with PCI), R-L collats, pRCA 50%, mRCA 30%.) ;Myoview done 07/19/11:  mod ant apical and inf apical scar, no ischemia, EF 45%    . Diabetes mellitus   . Hyperlipidemia   . Hypertension   . Ischemic cardiomyopathy    echo 7/10: inf septal and apical HK, EF 45-50%, mild LAE.    Marland Kitchen Myocardial infarction [facility]) [id number]    Past Surgical History:  Procedure Laterality Date  . CHOLECYSTECTOMY    . CORONARY ANGIOPLASTY  2956,2130  . TONSILLECTOMY    . TONSILLECTOMY     age 64  . TOTAL KNEE ARTHROPLASTY  01/23/2012   Procedure: TOTAL KNEE ARTHROPLASTY;  Surgeon: Shelda Pal, MD;  Location: [facility];  Service: Orthopedics;  Laterality: Right;     reports that she has quit smoking. Her smoking use included cigarettes. She quit after 15.00 years of use. She quit smokeless tobacco use about 29 years ago. She reports that she does not drink alcohol and does not use drugs. Social History  Allergies  Allergen Reactions  . Ozempic (0.25 Or 0.5 Mg-Dose)  [Semaglutide(0.25 Or 0.5mg -Dos)] Itching    Family History  Problem Relation Age of Onset  . Lupus Mother   . Diabetes Father   . Cancer Maternal Grandmother   . Heart disease Maternal Grandfather   . Cancer Paternal Grandmother   . Arthritis Other   . Colon cancer Other   . Diabetes Other      Prior to Admission medications   Medication Sig Start Date End Date Taking? Authorizing Provider  aspirin 81 MG tablet Take  81 mg by mouth daily with breakfast.   Yes [provider]  carvedilol (COREG) 6.25 MG tablet Take 1 tablet (6.25 mg total) by mouth 2 (two) times daily with a meal. 12/05/13  Yes Kathleene HazelMcAlhany, Christopher D, MD  clopidogrel (PLAVIX) 75 MG tablet TAKE 1 TABLET (75 MG TOTAL) BY MOUTH DAILY. 04/05/17  Yes Kathleene HazelMcAlhany, Christopher D, MD  glyBURIDE-metformin (GLUCOVANCE) 5-500 MG per tablet Take 2 tablets by mouth 2 (two) times daily with a meal.   Yes [provider]  insulin degludec (TRESIBA FLEXTOUCH) 200 UNIT/ML FlexTouch Pen Inject 50 Units into the skin daily.   Yes [provider]  ONE TOUCH ULTRA TEST test strip  08/13/11  Yes [provider]  valsartan (DIOVAN) 80 MG tablet Take 80 mg by mouth at bedtime.   Yes [provider]    Physical Exam: Vitals:   11/02/20 1900 11/02/20 2030 11/02/20 2100 11/02/20 2205  BP: (!) 159/45 (!) 139/109 (!) 126/48 (!) 160/71  Pulse: 83 79 77 74  Resp: 19 14 20 16   Temp:    98 F (36.7 C)  TempSrc:    Oral  SpO2: 100% 97% 100% 100%  Weight:    108.1 kg  Height:        Constitutional: NAD, calm, comfortable, obese female lying flat in bed Vitals:   11/02/20 1900 11/02/20 2030 11/02/20 2100 11/02/20 2205  BP: (!) 159/45 (!) 139/109 (!) 126/48 (!) 160/71  Pulse: 83 79 77 74  Resp: 19 14 20 16   Temp:    98 F (36.7 C)  TempSrc:    Oral  SpO2: 100% 97% 100% 100%  Weight:    108.1 kg  Height:       Eyes: PERRL, lids and conjunctivae normal ENMT: Mucous membranes are moist.  Neck: normal, supple Respiratory: clear to auscultation bilaterally, no wheezing, no crackles. Normal respiratory effort. No accessory muscle use.  Cardiovascular: Regular rate and rhythm, no murmurs / rubs / gallops. No extremity edema. 2+ pedal pulses. No carotid bruits.  Abdomen: no tenderness, no masses palpated.  Bowel sounds positive.  Musculoskeletal: no clubbing / cyanosis. No joint deformity upper and lower extremities. Good ROM, no contractures. Normal muscle tone.  Skin: no rashes, lesions, ulcers. No induration  Neurologic: CN 2-12 grossly intact. Sensation intact, Strength 5/5 in all 4. Aphasia/trouble word finding.  Patient unable to name simple objects such as phone.  Unable to spell her name on paper.  Comprehension intact.  Past finger pointing of the right upper extremity.  Intact to finger pointing of the left upper extremity.  Intact bilateral heel-to-shin. Psychiatric: Normal judgment and insight. Alert and oriented x 3. Normal mood.    Labs on Admission: I have personally reviewed following labs and  imaging studies  CBC: Recent Labs  Lab 10/29/20 2009 11/02/20 1630  WBC 11.4* 11.4*  NEUTROABS  --  7.6  HGB 12.1 12.4  HCT 37.6 38.7  MCV 81.2 81.0  PLT 349 343   Basic Metabolic Panel: Recent Labs  Lab 10/29/20 2009 11/02/20 1630  NA 139 136  K 3.0* 3.0*  CL 102 100  CO2 30 26  GLUCOSE 288* 258*  BUN 9 10  CREATININE 0.80 0.75  CALCIUM 9.0 8.7*   GFR: Estimated Creatinine Clearance: 87.6 mL/min (by C-G formula based on SCr of 0.75 mg/dL). Liver Function Tests: Recent Labs  Lab 11/02/20 1630  AST 17  ALT 12  ALKPHOS 68  BILITOT 0.4  PROT 7.0  ALBUMIN 3.1*   No results for input(s): LIPASE, AMYLASE in the last 168 hours. No results for input(s): AMMONIA in the last 168 hours. Coagulation Profile: Recent Labs  Lab 11/02/20 1630  INR 1.0   Cardiac Enzymes: No results for input(s): CKTOTAL, CKMB, CKMBINDEX, TROPONINI in the last 168 hours. BNP (last 3 results) No results for input(s): PROBNP in the last 8760 hours. HbA1C: No results for input(s): HGBA1C in the last 72 hours. CBG: Recent Labs  Lab 11/03/20 0138  GLUCAP 119*   Lipid Profile: No results for input(s): CHOL, HDL, LDLCALC, TRIG, CHOLHDL, LDLDIRECT in the last 72 hours. Thyroid Function Tests: Recent Labs    11/02/20 1630  TSH 3.097   Anemia Panel: No results for input(s): VITAMINB12, FOLATE, FERRITIN, TIBC, IRON, RETICCTPCT in the last 72 hours. Urine analysis:    Component Value Date/Time   COLORURINE YELLOW 11/02/2020 1630   APPEARANCEUR CLOUDY (A) 11/02/2020 1630   LABSPEC 1.015 11/02/2020 1630   PHURINE 6.0 11/02/2020 1630   GLUCOSEU 250 (A) 11/02/2020 1630   HGBUR NEGATIVE 11/02/2020 1630   BILIRUBINUR NEGATIVE 11/02/2020 1630   KETONESUR NEGATIVE 11/02/2020 1630   PROTEINUR NEGATIVE 11/02/2020 1630   UROBILINOGEN 0.2 01/18/2012 0932   NITRITE NEGATIVE 11/02/2020 1630   LEUKOCYTESUR SMALL (A) 11/02/2020 1630    Radiological Exams on Admission: CT Angio Head W or Wo  Contrast  Result Date: 11/02/2020 CLINICAL DATA:  Neuro deficit, acute, stroke suspected; right-sided blurriness and speech difficulty for the last 4 days. EXAM: CT ANGIOGRAPHY HEAD AND NECK TECHNIQUE: Multidetector CT imaging of the head and neck was performed using the standard protocol during bolus administration of intravenous contrast. Multiplanar CT image reconstructions and MIPs were obtained to evaluate the vascular anatomy. Carotid stenosis measurements (when applicable) are obtained utilizing NASCET criteria, using the distal internal carotid diameter as the denominator. CONTRAST:  OMNIPAQUE IOHEXOL 350 MG/ML SOLN COMPARISON:  No pertinent prior exams available for comparison. FINDINGS: CT HEAD FINDINGS Brain: Please note portions of the cerebellum and temporal lobes are excluded from the field of view inferiorly. Mild cerebral and cerebellar atrophy. Abnormal hypodensity within the left temporal occipital lobes (PCA vascular territory) compatible with acute/early subacute cortical/subcortical infarction. This measures 5.6 x 2.0 cm in transaxial dimensions. There is mild hyperdensity within the infarction territory likely affecting petechial hemorrhage. Background patchy and ill-defined hypoattenuation within cerebral white matter which is nonspecific, but compatible with chronic small vessel ischemic disease. Chronic right thalamic lacunar infarct No extra-axial fluid collection. No evidence of intracranial mass. No midline shift. Vascular: No hyperdense vessel. Skull: Negative for fracture or focal suspicious osseous lesion at the imaged levels. Sinuses: No significant paranasal sinus disease at the imaged levels. Orbits: No orbital mass or acute finding. Review of the MIP images confirms the above findings CTA NECK FINDINGS Aortic arch: Standard aortic branching. The visualized aortic arch is normal in caliber. No hemodynamically significant innominate or proximal subclavian artery stenosis.  Right carotid system: CCA and ICA patent within the neck without stenosis. No significant atherosclerotic disease. Left carotid system: CCA and ICA patent within the neck without stenosis. No significant atherosclerotic disease. Vertebral arteries: Vertebral arteries patent within neck without stenosis. Left vertebral artery dominant. Skeleton: No acute bony abnormality or aggressive osseous lesion. Cervical spondylosis with multilevel disc space narrowing, disc bulges, central disc protrusions, endplate spurring and uncovertebral hypertrophy. Apparent fusion across the C6-C7 disc space. Additionally, disc space narrowing is severe at C7-T1. Prominent multilevel bridging ventral osteophytes. Other neck: 10  mm left thyroid lobe nodule not meeting consensus criteria for ultrasound follow-up. No neck mass or cervical lymphadenopathy Upper chest: No consolidation within the imaged lung apices. Review of the MIP images confirms the above findings CTA HEAD FINDINGS Anterior circulation: The intracranial internal carotid arteries are patent. Calcified plaque within both vessels with no more than mild stenosis. The M1 middle cerebral arteries are patent. No M2 proximal branch occlusion or high-grade proximal stenosis is identified. The anterior cerebral arteries are patent. Posterior circulation: The intracranial vertebral arteries are patent. The basilar artery is patent. The posterior cerebral arteries are patent proximally. Portions of the left posterior cerebral artery are poorly delineated beyond the P2/P3 junction and this portion of the vessel may be occluded. Posterior communicating arteries are present bilaterally. Venous sinuses: Within the limitations of contrast timing, no convincing thrombus. Anatomic variants: None significant Review of the MIP images confirms the above findings These results were called by telephone at the time of interpretation on 11/02/2020 at 6:03 pm to provider [facility] , who  verbally acknowledged these results. IMPRESSION: CT head: 1. Please note portions of the cerebellum and temporal lobes are excluded from the field of view inferiorly 2. Acute/early subacute left temporal occipital PCA territory infarct with mild petechial hemorrhage. 3. Background cerebral white matter chronic small vessel ischemic disease. 4. Chronic right thalamic lacunar infarct 5. Mild generalized parenchymal atrophy. CTA neck: 1. The bilateral common carotid, internal carotid and vertebral arteries are patent within the neck without stenosis. No significant atherosclerotic disease. 2. Cervical spondylosis and levels of fusion, as described. CTA head: 1. The left posterior cerebral artery is patent proximally. However, portions of the left posterior cerebral artery are poorly delineated beyond the P2/P3 junction and this portion of the vessel may be occluded. 2. Elsewhere, no intracranial large vessel occlusion or proximal high-grade arterial stenosis is identified. 3. Calcified plaque within the intracranial internal carotid arteries bilaterally with no more than mild stenosis. Electronically Signed   By: Jackey Loge DO   On: 11/02/2020 18:05   CT Angio Neck W and/or Wo Contrast  Result Date: 11/02/2020 CLINICAL DATA:  Neuro deficit, acute, stroke suspected; right-sided blurriness and speech difficulty for the last 4 days. EXAM: CT ANGIOGRAPHY HEAD AND NECK TECHNIQUE: Multidetector CT imaging of the head and neck was performed using the standard protocol during bolus administration of intravenous contrast. Multiplanar CT image reconstructions and MIPs were obtained to evaluate the vascular anatomy. Carotid stenosis measurements (when applicable) are obtained utilizing NASCET criteria, using the distal internal carotid diameter as the denominator. CONTRAST:  OMNIPAQUE IOHEXOL 350 MG/ML SOLN COMPARISON:  No pertinent prior exams available for comparison. FINDINGS: CT HEAD FINDINGS Brain: Please note  portions of the cerebellum and temporal lobes are excluded from the field of view inferiorly. Mild cerebral and cerebellar atrophy. Abnormal hypodensity within the left temporal occipital lobes (PCA vascular territory) compatible with acute/early subacute cortical/subcortical infarction. This measures 5.6 x 2.0 cm in transaxial dimensions. There is mild hyperdensity within the infarction territory likely affecting petechial hemorrhage. Background patchy and ill-defined hypoattenuation within cerebral white matter which is nonspecific, but compatible with chronic small vessel ischemic disease. Chronic right thalamic lacunar infarct No extra-axial fluid collection. No evidence of intracranial mass. No midline shift. Vascular: No hyperdense vessel. Skull: Negative for fracture or focal suspicious osseous lesion at the imaged levels. Sinuses: No significant paranasal sinus disease at the imaged levels. Orbits: No orbital mass or acute finding. Review of the MIP images confirms the  above findings CTA NECK FINDINGS Aortic arch: Standard aortic branching. The visualized aortic arch is normal in caliber. No hemodynamically significant innominate or proximal subclavian artery stenosis. Right carotid system: CCA and ICA patent within the neck without stenosis. No significant atherosclerotic disease. Left carotid system: CCA and ICA patent within the neck without stenosis. No significant atherosclerotic disease. Vertebral arteries: Vertebral arteries patent within neck without stenosis. Left vertebral artery dominant. Skeleton: No acute bony abnormality or aggressive osseous lesion. Cervical spondylosis with multilevel disc space narrowing, disc bulges, central disc protrusions, endplate spurring and uncovertebral hypertrophy. Apparent fusion across the C6-C7 disc space. Additionally, disc space narrowing is severe at C7-T1. Prominent multilevel bridging ventral osteophytes. Other neck: 10 mm left thyroid lobe nodule not  meeting consensus criteria for ultrasound follow-up. No neck mass or cervical lymphadenopathy Upper chest: No consolidation within the imaged lung apices. Review of the MIP images confirms the above findings CTA HEAD FINDINGS Anterior circulation: The intracranial internal carotid arteries are patent. Calcified plaque within both vessels with no more than mild stenosis. The M1 middle cerebral arteries are patent. No M2 proximal branch occlusion or high-grade proximal stenosis is identified. The anterior cerebral arteries are patent. Posterior circulation: The intracranial vertebral arteries are patent. The basilar artery is patent. The posterior cerebral arteries are patent proximally. Portions of the left posterior cerebral artery are poorly delineated beyond the P2/P3 junction and this portion of the vessel may be occluded. Posterior communicating arteries are present bilaterally. Venous sinuses: Within the limitations of contrast timing, no convincing thrombus. Anatomic variants: None significant Review of the MIP images confirms the above findings These results were called by telephone at the time of interpretation on 11/02/2020 at 6:03 pm to provider [facility] , who verbally acknowledged these results. IMPRESSION: CT head: 1. Please note portions of the cerebellum and temporal lobes are excluded from the field of view inferiorly 2. Acute/early subacute left temporal occipital PCA territory infarct with mild petechial hemorrhage. 3. Background cerebral white matter chronic small vessel ischemic disease. 4. Chronic right thalamic lacunar infarct 5. Mild generalized parenchymal atrophy. CTA neck: 1. The bilateral common carotid, internal carotid and vertebral arteries are patent within the neck without stenosis. No significant atherosclerotic disease. 2. Cervical spondylosis and levels of fusion, as described. CTA head: 1. The left posterior cerebral artery is patent proximally. However, portions of the  left posterior cerebral artery are poorly delineated beyond the P2/P3 junction and this portion of the vessel may be occluded. 2. Elsewhere, no intracranial large vessel occlusion or proximal high-grade arterial stenosis is identified. 3. Calcified plaque within the intracranial internal carotid arteries bilaterally with no more than mild stenosis. Electronically Signed   By: Jackey Loge DO   On: 11/02/2020 18:05      Assessment/Plan  Left temporal occipital PCA territory infarct -CTA head showed no LVO but had possible occluded P2/P3 junction of left posterior cerebral artery - obtain MRI brain- pre-medicate beforehand for anxiety -Had echocardiogram on 10/22/20 with EF of 45-50% and Grade I diastolic dysfunction but no bubble study done  -Obtain A1c and lipids -PT/OT/SLT -Frequent neuro checks and keep on telemetry -Allow for permissive hypertension with blood pressure treatment as needed only if systolic goes above 220 -Appreciate neurology recommendation regarding aspirin and Plavix due to mild petechial hemorrhage seen on CT head  CAD s/p stent See above regarding DAPT  Hypokalemia Replete with IV potassium  HTN allow for permissive HTN  HLD does not tolerate statin due to myalgia  Type 2 DM Takes 60 units of Tresiba at home start with 20 U Lantus qHS and moderate SSI - pt NPO   DVT prophylaxis:.Lovenox Code Status: Full Family Communication: Plan discussed with patient at bedside  disposition Plan: Home with observation Consults called: Neurology Admission status: Observation  Level of care: Telemetry Medical  Status is: Observation  The patient remains OBS appropriate and will d/c before 2 midnights.  Dispo: The patient is from: Home              Anticipated d/c is to: Home              Patient currently is not medically stable to d/c.   Difficult to place patient No         Anselm Jungling DO [facility]   If 7PM-7AM, please contact  night-coverage [email or web address]   11/03/2020, 1:40 AM

## 2020-11-03 NOTE — Discharge Instructions (Addendum)
NO DRIVING until further notice due to your stroke related peripheral vision loss on the right side.   Wound care instructions (heart monitor implant) Keep incision clean and dry for 3 days. You can remove outer dressing tomorrow. Leave steri-strips (little pieces of tape) on until seen in the office for wound check appointment. Call the office [phone]) for redness, drainage, swelling, or fever.

## 2020-11-03 NOTE — Plan of Care (Signed)
Pt given stroke book as directed.

## 2020-11-03 NOTE — Evaluation (Addendum)
Occupational Therapy Evaluation Patient Details Name: [name] MRN: [id number] DOB: 1960/10/06 Today's Date: 11/03/2020    History of Present Illness [name] is a 60 y.o. female with a history of hypertension, hyperlipidemia, diabetes who presents with visual change and trouble speaking. 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   Clinical Impression   Pt PTA: Pt was independent. Pt currently with focal deficits in R peripheral visual deficit and expressive deficits. Pt currently, minguardA for ADL and mobility with RW is safest. Pt appears to be cognitively aware of her aphasia and encouraged to continue to talk to explain or describe the puzzling word and to turn head to R to avoid obtacles; have someone walking on her R side to assist. Pt with 1 LOB episode in bathroom requiring single UE for support at all times. Pt would benefit from continued OT skilled services. OT following acutely.      Follow Up Recommendations  Outpatient OT;Supervision - Intermittent (OP OT for vision of R eye)    Equipment Recommendations  None recommended by OT    Recommendations for Other Services       Precautions / Restrictions Precautions Precautions: Fall;Other (comment) Precaution Comments: R peripheral vision loss Restrictions Weight Bearing Restrictions: No      Mobility Bed Mobility Overal bed mobility: Needs Assistance Bed Mobility: Supine to Sit     Supine to sit: Supervision     General bed mobility comments: comes to EOB    Transfers Overall transfer level: Needs assistance Equipment used: Rolling walker (2 wheeled) Transfers: Sit to/from [employer] Sit to Stand: Min guard Stand pivot transfers: Min guard       General transfer comment: Cues to walk closer to RW    Balance Overall balance assessment: Needs assistance Sitting-balance support: Bilateral upper extremity supported;Feet supported Sitting balance-Leahy  Scale: Good     Standing balance support: Single extremity supported;During functional activity Standing balance-Leahy Scale: Poor Standing balance comment: with RW, stability required to hold onto sink ot grab bar in BA; pt with 1 LOB episode.                           ADL either performed or assessed with clinical judgement   ADL Overall ADL's : Needs assistance/impaired Eating/Feeding: Set up;Sitting   Grooming: Set up;Sitting   Upper Body Bathing: Supervision/ safety;Standing   Lower Body Bathing: Min guard;Sitting/lateral leans;Sit to/from stand   Upper Body Dressing : Supervision/safety;Standing   Lower Body Dressing: Min guard;Cueing for safety;Sitting/lateral leans;Sit to/from stand Lower Body Dressing Details (indicate cue type and reason): hip hike technique for LB ADL Toilet Transfer: Min guard;Cueing for safety;Ambulation;RW   Toileting- Clothing Manipulation and Hygiene: Min guard;Cueing for safety;Sitting/lateral lean;Sit to/from stand       Functional mobility during ADLs: Min guard;Rolling walker;Cueing for safety;Cueing for sequencing General ADL Comments: Pt requiring assist for guidance for LB ADL. Pt performing     Vision Baseline Vision/History: Wears glasses Wears Glasses: Reading only Patient Visual Report: Peripheral vision impairment (R eye) Vision Assessment?: Yes Eye Alignment: Within Functional Limits Ocular Range of Motion: Within Functional Limits Tracking/Visual Pursuits: Able to track stimulus in all quads without difficulty Additional Comments: R peripheral field is impaired     Perception     Praxis      Pertinent Vitals/Pain Pain Assessment: No/denies pain     Hand Dominance Right   Extremity/Trunk Assessment Upper Extremity Assessment Upper Extremity  Assessment: Generalized weakness   Lower Extremity Assessment Lower Extremity Assessment: Generalized weakness   Cervical / Trunk Assessment Cervical / Trunk  Assessment: Normal   Communication Communication Communication: No difficulties   Cognition Arousal/Alertness: Lethargic Behavior During Therapy: WFL for tasks assessed/performed Overall Cognitive Status: Difficult to assess                                     General Comments  VSS on RA; Pt's sister is in room and confirmed that pt is going to have assist at home    Exercises     Shoulder Instructions      Home Living Family/patient expects to be discharged to:: Private residence Living Arrangements: Alone Available Help at Discharge: Family Type of Home: House             Bathroom Shower/Tub: Walk-in Human resources officer: Standard         Additional Comments: plans to go home with sister or have sister assist      Prior Functioning/Environment Level of Independence: Independent                 OT Problem List: Impaired vision/perception;Decreased activity tolerance      OT Treatment/Interventions: Self-care/ADL training;Therapeutic activities;Visual/perceptual remediation/compensation;Patient/family education;Balance training;Energy conservation    OT Goals(Current goals can be found in the care plan section) Acute Rehab OT Goals Patient Stated Goal: to go home OT Goal Formulation: With patient Time For Goal Achievement: 11/17/20 Potential to Achieve Goals: Good ADL Goals Additional ADL Goal #1: Pt will perform R peripheral field visual tasks and any applicable exercises with minimal cues to perform task. Additional ADL Goal #2: Pt will perform OOB ADL x5 mins with supervisionA to increase independence with ADL/mobility.  OT Frequency: Min 2X/week   Barriers to D/C:            Co-evaluation              AM-PAC OT "6 Clicks" Daily Activity     Outcome Measure Help from another person eating meals?: None Help from another person taking care of personal grooming?: A Little Help from another person toileting, which  includes using toliet, bedpan, or urinal?: A Little Help from another person bathing (including washing, rinsing, drying)?: A Little Help from another person to put on and taking off regular upper body clothing?: None Help from another person to put on and taking off regular lower body clothing?: A Little 6 Click Score: 20   End of Session Equipment Utilized During Treatment: Rolling walker Nurse Communication: Mobility status  Activity Tolerance: Patient tolerated treatment well Patient left: in bed;with call bell/phone within reach;with bed alarm set;with family/visitor present  OT Visit Diagnosis: Unsteadiness on feet (R26.81);Cognitive communication deficit (R41.841)                Time: 3846-6599 OT Time Calculation (min): 32 min Charges:  OT General Charges $OT Visit: 1 Visit OT Evaluation $OT Eval Moderate Complexity: 1 Mod OT Treatments $Neuromuscular Re-education: 8-22 mins  Flora Lipps, OTR/L Acute Rehabilitation Services Pager: [phone] Office: [phone]   [name]  C 11/03/2020, 4:56 PM

## 2020-11-03 NOTE — Evaluation (Signed)
Speech Language Pathology Evaluation Patient Details Name: [name] MRN: [id number] DOB: [birth year] Today's Date: 11/03/2020 Time: 0955-1010 SLP Time Calculation (min) (ACUTE ONLY): 15 min  Problem List:  Patient Active Problem List   Diagnosis Date Noted  . Stroke (HCC) 11/02/2020  . S/P right TKA 01/23/2012  . Chest pain 07/12/2011  . Palpitations 07/12/2011  . Hyperlipidemia 02/10/2009  . CAD, NATIVE VESSEL 02/10/2009  . CARDIOMYOPATHY, ISCHEMIC 02/10/2009  . ARTHRITIS 02/10/2009  . Type 2 diabetes mellitus with hyperlipidemia (HCC) 04/23/2007  . Essential hypertension 04/23/2007   Past Medical History:  Past Medical History:  Diagnosis Date  . Arthritis   . CAD (coronary artery disease)    s/p Taxus DES to pLAD 9/07; ant STEMI 6/10 due to late stent thrombosis treated with thrombectomy and POBA to LAD (LHC: ant apical AK, Ef 40%, dLM 20-30%, pLAD occluded at prox edge of stent (treated with PCI), R-L collats, pRCA 50%, mRCA 30%.) ;Myoview done 07/19/11: mod ant apical and inf apical scar, no ischemia, EF 45%    . Diabetes mellitus   . Hyperlipidemia   . Hypertension   . Ischemic cardiomyopathy    echo 7/10: inf septal and apical HK, EF 45-50%, mild LAE.    Marland Kitchen Myocardial infarction [facility]) [id number]   Past Surgical History:  Past Surgical History:  Procedure Laterality Date  . CHOLECYSTECTOMY    . CORONARY ANGIOPLASTY  [id number]  . TONSILLECTOMY    . TONSILLECTOMY     age 83  . TOTAL KNEE ARTHROPLASTY  01/23/2012   Procedure: TOTAL KNEE ARTHROPLASTY;  Surgeon: Shelda Pal, MD;  Location: [facility];  Service: Orthopedics;  Laterality: Right;   HPI:  Pt is a 60 y.o. female with medical history significant for CAD s/p stent , HTN, HLD and Type 2 DM who presened with 4 days of right peripheral vision loss and new onset aphasia. CT head: Acute/early subacute left temporal occipital PCA territory infarct with mild petechial hemorrhage. Pt passed the [facility] swallow screen  on 3/29 and swallow evaluation was ordered on 3/30.   Assessment / Plan / Recommendation Clinical Impression  Pt was seen for speech/language evaluation with her brother present. Pt denied any baseline deficits in speech, language, or cognition. 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 Pt presented with moderate non-fluent aphasia. She demonstrated deficits in both receptive and expressive language. However, more notable difficulty was noted with verbal expression. She was able to follow 1-step and some 2-step commands, and consistently answer simple questions accurately. However, she demonstrated difficulty with reading comprehension at the sentence and paragraph levels, and with auditory comprehension of complex information. Pt mainly produced single-word utterances with some occasional 2-word utterances. She was able to complete confrontational naming tasks and automatic sequences when additional processing time was provided, but she exhibited difficulty with other structured naming tasks and with sentence repetition. No motor speech deficits were noted during the evaluation and cognition assessment was deferred due to pt's language deficits. Skilled SLP services are clinically indicated at this time to improve language function.    SLP Assessment  SLP Recommendation/Assessment: Patient needs continued Speech Lanaguage Pathology Services SLP Visit Diagnosis: Aphasia (R47.01)    Follow Up Recommendations   (Continued SLP services at level of care recommended by PT/OT)    Frequency and Duration min 2x/week  2 weeks      SLP Evaluation Cognition  Overall Cognitive Status: Difficult  to assess (due to language impairements) Arousal/Alertness: Awake/alert Attention: Sustained;Focused Focused Attention: Appears intact Sustained Attention: Appears intact       Comprehension  Auditory Comprehension Overall Auditory  Comprehension: Impaired Yes/No Questions: Impaired Basic Immediate Environment Questions:  (5/5) Complex Questions:  (3/5) Paragraph Comprehension (via yes/no questions):  (2/4) Commands: Impaired One Step Basic Commands:  (5/5) Two Step Basic Commands:  (3/4) Multistep Basic Commands:  (1/3) Conversation: Simple Reading Comprehension Reading Status: Impaired Word level: Within functional limits Sentence Level: Impaired (3/4) Paragraph Level: Impaired Functional Environmental (signs, name badge):  (3/6)    Expression Expression Primary Mode of Expression: Verbal Verbal Expression Overall Verbal Expression: Impaired Initiation: Impaired Automatic Speech: Counting;Day of week;Month of year (Counting: 10/10 ; Days: 6/7; Months: 12/12 with processing additional time) Level of Generative/Spontaneous Verbalization: Sentence Repetition: Impaired Level of Impairment: Sentence level (0/5) Naming: Impairment Confrontation: Impaired (10/10 with additional procesing time) Convergent:  (Sentence completion: 4/5) Divergent: Not tested Pragmatics: No impairment   Oral / Motor  Oral Motor/Sensory Function Overall Oral Motor/Sensory Function: Within functional limits Motor Speech Overall Motor Speech: Appears within functional limits for tasks assessed Respiration: Within functional limits Phonation: Normal Resonance: Within functional limits Articulation: Within functional limitis Intelligibility: Intelligible Motor Planning: Witnin functional limits Motor Speech Errors: Not applicable   Nyilah Kight I. Vear Clock, MS, CCC-SLP Acute Rehabilitation Services Office number [phone] Pager [phone]                    Scheryl Marten 11/03/2020, 10:42 AM

## 2020-11-03 NOTE — Progress Notes (Signed)
PROGRESS NOTE  [name] TIR:[id number] DOB: 1960/12/02 DOA: 11/02/2020 PCP: [name], MD  HPI/Recap of past 24 hours: HPI from Dr Dorita Sciara [name] is a 60 y.o. female with medical history significant for CAD s/p stent , HTN, HLD and Type 2 DM who presents as a transfer from [facility] with 4 days of right peripheral vision loss and new onset aphasia. Patient had difficulty recalling history given aphasia and difficulty with word finding.  History mostly obtained from documentation that was given by her sister who is not at bedside at this time. Patient is right-handed and was seen for her decreased right peripheral vision vision on 3/25 in the ED but due to extreme claustrophobia to MRI she declined further work-up and wanted follow-up with her ophthalmologist. She presented back to the ED because of the addition of new onset aphasia and difficulty word finding. In the ED, was afebrile and hypertensive up to 160s systolic. CTA head and neck showed acute/early subactue left temporal occipital PCA territory infarct with mild petechial hemorrhage. Parts of left posterior cerebral artery may also be occluded.  Neurology consulted.  Patient admitted for further management.    Today, patient denies any new complaints, still noted to have right peripheral vision loss and aphasic.  Denies any chest pain, abdominal pain, nausea/vomiting, fever/chills.  Brother and sister at bedside, patient was able to identify them and was able to say their names.   Assessment/Plan: Principal Problem:   Stroke [facility]) Active Problems:   Type 2 diabetes mellitus with hyperlipidemia (HCC)   Hyperlipidemia   Essential hypertension   CAD, NATIVE VESSEL   Acute to subacute left occipital infarction MRI showed above with hemorrhagic conversion, no mass-effect noted CTA head showed no LVO but occluded P2/P3 junction of the left posterior cerebral artery Echo done on 10/22/2020 showed EF of 45 to 50%,  grade 1 diastolic dysfunction, severe akinesis of the left ventricular anterior septal wall, mild mitral valve regurgitation, ??repeat LDL 85, A1c 10.8 Neurology/stroke team consulted, appreciate recs PT/OT/SLP Frequent neurochecks, telemetry Continue aspirin, started on Brilinta, held/discontinued home Plavix  Hypokalemia Replace as needed  Chronic combined systolic and diastolic HF Appears euvolemic Echo with EF of 45 to 50%, grade 1 diastolic dysfunction Hold home Coreg, losartan for now  Hypertension Allow for permissive hypertension for now Hold home Coreg, losartan for now  Diabetes mellitus type 2, uncontrolled A1c 10.8 Continue SSI, glargine, Accu-Cheks, hypoglycemic protocol Hold home regimen for now  CAD s/p stent/hyperlipidemia Chest pain-free Continue aspirin, Zetia  Leukocytosis Likely reactive Daily CBC    Estimated body mass index is 40.91 kg/m as calculated from the following:   Height as of this encounter: 5\' 4"  (1.626 m).   Weight as of this encounter: 108.1 kg.     Code Status: Full  Family Communication: Discussed with brother and sister at bedside on 11/03/2020  Disposition Plan: Status is: Observation  The patient remains OBS appropriate and will d/c before 2 midnights.  Dispo: The patient is from: Home              Anticipated d/c is to: Home              Patient currently is not medically stable to d/c.   Difficult to place patient No    Consultants:  Neurology  Procedures:  None  Antimicrobials:  None  DVT prophylaxis: Lovenox   Objective: Vitals:   11/03/20 0300 11/03/20 0400 11/03/20 0500 11/03/20 0600  BP:  Pulse:      Resp: 19 15 19 17   Temp:      TempSrc:      SpO2:      Weight:      Height:        Intake/Output Summary (Last 24 hours) at 11/03/2020 1516 Last data filed at 11/03/2020 0600 Gross per 24 hour  Intake 150 ml  Output 600 ml  Net -450 ml   Filed Weights   11/02/20 1606 11/02/20 2205   Weight: 113 kg 108.1 kg    Exam:  General: NAD, alert, awake, oriented, noted aphasia  Cardiovascular: S1, S2 present  Respiratory: CTAB  Abdomen: Soft, nontender, nondistended, bowel sounds present  Musculoskeletal: No bilateral pedal edema noted  Skin: Normal  Psychiatry: Normal mood  Neurology: Strength 5/5 in all 4 extremities, sensation intact, noted aphasia, comprehension intact, noted right hemianopia   Data Reviewed: CBC: Recent Labs  Lab 10/29/20 2009 11/02/20 1630 11/03/20 0344  WBC 11.4* 11.4* 10.9*  NEUTROABS  --  7.6 6.6  HGB 12.1 12.4 12.0  HCT 37.6 38.7 38.9  MCV 81.2 81.0 83.3  PLT 349 343 350   Basic Metabolic Panel: Recent Labs  Lab 10/29/20 2009 11/02/20 1630 11/03/20 0344  NA 139 136 139  K 3.0* 3.0* 2.9*  CL 102 100 104  CO2 30 26 27   GLUCOSE 288* 258* 109*  BUN 9 10 6*  CREATININE 0.80 0.75 0.59  CALCIUM 9.0 8.7* 8.9  MG  --   --  1.7   GFR: Estimated Creatinine Clearance: 87.6 mL/min (by C-G formula based on SCr of 0.59 mg/dL). Liver Function Tests: Recent Labs  Lab 11/02/20 1630  AST 17  ALT 12  ALKPHOS 68  BILITOT 0.4  PROT 7.0  ALBUMIN 3.1*   No results for input(s): LIPASE, AMYLASE in the last 168 hours. No results for input(s): AMMONIA in the last 168 hours. Coagulation Profile: Recent Labs  Lab 11/02/20 1630  INR 1.0   Cardiac Enzymes: No results for input(s): CKTOTAL, CKMB, CKMBINDEX, TROPONINI in the last 168 hours. BNP (last 3 results) No results for input(s): PROBNP in the last 8760 hours. HbA1C: Recent Labs    11/03/20 0344  HGBA1C 10.8*   CBG: Recent Labs  Lab 11/03/20 0138 11/03/20 0741 11/03/20 1226  GLUCAP 119* 131* 153*   Lipid Profile: Recent Labs    11/03/20 0344  CHOL 144  HDL 39*  LDLCALC 85  TRIG 11/05/20  CHOLHDL 3.7   Thyroid Function Tests: Recent Labs    11/02/20 1630  TSH 3.097   Anemia Panel: No results for input(s): VITAMINB12, FOLATE, FERRITIN, TIBC, IRON,  RETICCTPCT in the last 72 hours. Urine analysis:    Component Value Date/Time   COLORURINE YELLOW 11/02/2020 1630   APPEARANCEUR CLOUDY (A) 11/02/2020 1630   LABSPEC 1.015 11/02/2020 1630   PHURINE 6.0 11/02/2020 1630   GLUCOSEU 250 (A) 11/02/2020 1630   HGBUR NEGATIVE 11/02/2020 1630   BILIRUBINUR NEGATIVE 11/02/2020 1630   KETONESUR NEGATIVE 11/02/2020 1630   PROTEINUR NEGATIVE 11/02/2020 1630   UROBILINOGEN 0.2 01/18/2012 0932   NITRITE NEGATIVE 11/02/2020 1630   LEUKOCYTESUR SMALL (A) 11/02/2020 1630   Sepsis Labs: @LABRCNTIP (procalcitonin:4,lacticidven:4)  ) Recent Results (from the past 240 hour(s))  Resp Panel by RT-PCR (Flu A&B, Covid) Nasopharyngeal Swab     Status: None   Collection Time: 11/02/20  7:22 PM   Specimen: Nasopharyngeal Swab; Nasopharyngeal(NP) swabs in vial transport medium  Result Value Ref Range Status  SARS Coronavirus 2 by RT PCR NEGATIVE NEGATIVE Final    Comment: (NOTE) SARS-CoV-2 target nucleic acids are NOT DETECTED.  The SARS-CoV-2 RNA is generally detectable in upper respiratory specimens during the acute phase of infection. The lowest concentration of SARS-CoV-2 viral copies this assay can detect is 138 copies/mL. A negative result does not preclude SARS-Cov-2 infection and should not be used as the sole basis for treatment or other patient management decisions. A negative result may occur with  improper specimen collection/handling, submission of specimen other than nasopharyngeal swab, presence of viral mutation(s) within the areas targeted by this assay, and inadequate number of viral copies(<138 copies/mL). A negative result must be combined with clinical observations, patient history, and epidemiological information. The expected result is Negative.  Fact Sheet for Patients:  BloggerCourse.com  Fact Sheet for Healthcare Providers:  SeriousBroker.it  This test is no t yet approved  or cleared by the [place] FDA and  has been authorized for detection and/or diagnosis of SARS-CoV-2 by FDA under an Emergency Use Authorization (EUA). This EUA will remain  in effect (meaning this test can be used) for the duration of the COVID-19 declaration under Section 564(b)(1) of the Act, 21 U.S.C.section 360bbb-3(b)(1), unless the authorization is terminated  or revoked sooner.       Influenza A by PCR NEGATIVE NEGATIVE Final   Influenza B by PCR NEGATIVE NEGATIVE Final    Comment: (NOTE) The Xpert Xpress SARS-CoV-2/FLU/RSV plus assay is intended as an aid in the diagnosis of influenza from Nasopharyngeal swab specimens and should not be used as a sole basis for treatment. Nasal washings and aspirates are unacceptable for Xpert Xpress SARS-CoV-2/FLU/RSV testing.  Fact Sheet for Patients: BloggerCourse.com  Fact Sheet for Healthcare Providers: SeriousBroker.it  This test is not yet approved or cleared by the [place] FDA and has been authorized for detection and/or diagnosis of SARS-CoV-2 by FDA under an Emergency Use Authorization (EUA). This EUA will remain in effect (meaning this test can be used) for the duration of the COVID-19 declaration under Section 564(b)(1) of the Act, 21 U.S.C. section 360bbb-3(b)(1), unless the authorization is terminated or revoked.  Performed at [facility], [address] [place], [place] [address]       Studies: CT Angio Head W or Wo Contrast  Result Date: 11/02/2020 CLINICAL DATA:  Neuro deficit, acute, stroke suspected; right-sided blurriness and speech difficulty for the last 4 days. EXAM: CT ANGIOGRAPHY HEAD AND NECK TECHNIQUE: Multidetector CT imaging of the head and neck was performed using the standard protocol during bolus administration of intravenous contrast. Multiplanar CT image reconstructions and MIPs were obtained to evaluate the vascular anatomy.  Carotid stenosis measurements (when applicable) are obtained utilizing NASCET criteria, using the distal internal carotid diameter as the denominator. CONTRAST:  OMNIPAQUE IOHEXOL 350 MG/ML SOLN COMPARISON:  No pertinent prior exams available for comparison. FINDINGS: CT HEAD FINDINGS Brain: Please note portions of the cerebellum and temporal lobes are excluded from the field of view inferiorly. Mild cerebral and cerebellar atrophy. Abnormal hypodensity within the left temporal occipital lobes (PCA vascular territory) compatible with acute/early subacute cortical/subcortical infarction. This measures 5.6 x 2.0 cm in transaxial dimensions. There is mild hyperdensity within the infarction territory likely affecting petechial hemorrhage. Background patchy and ill-defined hypoattenuation within cerebral white matter which is nonspecific, but compatible with chronic small vessel ischemic disease. Chronic right thalamic lacunar infarct No extra-axial fluid collection. No evidence of intracranial mass. No midline shift. Vascular: No  hyperdense vessel. Skull: Negative for fracture or focal suspicious osseous lesion at the imaged levels. Sinuses: No significant paranasal sinus disease at the imaged levels. Orbits: No orbital mass or acute finding. Review of the MIP images confirms the above findings CTA NECK FINDINGS Aortic arch: Standard aortic branching. The visualized aortic arch is normal in caliber. No hemodynamically significant innominate or proximal subclavian artery stenosis. Right carotid system: CCA and ICA patent within the neck without stenosis. No significant atherosclerotic disease. Left carotid system: CCA and ICA patent within the neck without stenosis. No significant atherosclerotic disease. Vertebral arteries: Vertebral arteries patent within neck without stenosis. Left vertebral artery dominant. Skeleton: No acute bony abnormality or aggressive osseous lesion. Cervical spondylosis with multilevel  disc space narrowing, disc bulges, central disc protrusions, endplate spurring and uncovertebral hypertrophy. Apparent fusion across the C6-C7 disc space. Additionally, disc space narrowing is severe at C7-T1. Prominent multilevel bridging ventral osteophytes. Other neck: 10 mm left thyroid lobe nodule not meeting consensus criteria for ultrasound follow-up. No neck mass or cervical lymphadenopathy Upper chest: No consolidation within the imaged lung apices. Review of the MIP images confirms the above findings CTA HEAD FINDINGS Anterior circulation: The intracranial internal carotid arteries are patent. Calcified plaque within both vessels with no more than mild stenosis. The M1 middle cerebral arteries are patent. No M2 proximal branch occlusion or high-grade proximal stenosis is identified. The anterior cerebral arteries are patent. Posterior circulation: The intracranial vertebral arteries are patent. The basilar artery is patent. The posterior cerebral arteries are patent proximally. Portions of the left posterior cerebral artery are poorly delineated beyond the P2/P3 junction and this portion of the vessel may be occluded. Posterior communicating arteries are present bilaterally. Venous sinuses: Within the limitations of contrast timing, no convincing thrombus. Anatomic variants: None significant Review of the MIP images confirms the above findings These results were called by telephone at the time of interpretation on 11/02/2020 at 6:03 pm to [facility] , who verbally acknowledged these results. IMPRESSION: CT head: 1. Please note portions of the cerebellum and temporal lobes are excluded from the field of view inferiorly 2. Acute/early subacute left temporal occipital PCA territory infarct with mild petechial hemorrhage. 3. Background cerebral white matter chronic small vessel ischemic disease. 4. Chronic right thalamic lacunar infarct 5. Mild generalized parenchymal atrophy. CTA neck: 1. The  bilateral common carotid, internal carotid and vertebral arteries are patent within the neck without stenosis. No significant atherosclerotic disease. 2. Cervical spondylosis and levels of fusion, as described. CTA head: 1. The left posterior cerebral artery is patent proximally. However, portions of the left posterior cerebral artery are poorly delineated beyond the P2/P3 junction and this portion of the vessel may be occluded. 2. Elsewhere, no intracranial large vessel occlusion or proximal high-grade arterial stenosis is identified. 3. Calcified plaque within the intracranial internal carotid arteries bilaterally with no more than mild stenosis. Electronically Signed   By: Jackey LogeKyle  Golden DO   On: 11/02/2020 18:05   CT Angio Neck W and/or Wo Contrast  Result Date: 11/02/2020 CLINICAL DATA:  Neuro deficit, acute, stroke suspected; right-sided blurriness and speech difficulty for the last 4 days. EXAM: CT ANGIOGRAPHY HEAD AND NECK TECHNIQUE: Multidetector CT imaging of the head and neck was performed using the standard protocol during bolus administration of intravenous contrast. Multiplanar CT image reconstructions and MIPs were obtained to evaluate the vascular anatomy. Carotid stenosis measurements (when applicable) are obtained utilizing NASCET criteria, using the distal internal carotid diameter as the denominator.  CONTRAST:  OMNIPAQUE IOHEXOL 350 MG/ML SOLN COMPARISON:  No pertinent prior exams available for comparison. FINDINGS: CT HEAD FINDINGS Brain: Please note portions of the cerebellum and temporal lobes are excluded from the field of view inferiorly. Mild cerebral and cerebellar atrophy. Abnormal hypodensity within the left temporal occipital lobes (PCA vascular territory) compatible with acute/early subacute cortical/subcortical infarction. This measures 5.6 x 2.0 cm in transaxial dimensions. There is mild hyperdensity within the infarction territory likely affecting petechial hemorrhage.  Background patchy and ill-defined hypoattenuation within cerebral white matter which is nonspecific, but compatible with chronic small vessel ischemic disease. Chronic right thalamic lacunar infarct No extra-axial fluid collection. No evidence of intracranial mass. No midline shift. Vascular: No hyperdense vessel. Skull: Negative for fracture or focal suspicious osseous lesion at the imaged levels. Sinuses: No significant paranasal sinus disease at the imaged levels. Orbits: No orbital mass or acute finding. Review of the MIP images confirms the above findings CTA NECK FINDINGS Aortic arch: Standard aortic branching. The visualized aortic arch is normal in caliber. No hemodynamically significant innominate or proximal subclavian artery stenosis. Right carotid system: CCA and ICA patent within the neck without stenosis. No significant atherosclerotic disease. Left carotid system: CCA and ICA patent within the neck without stenosis. No significant atherosclerotic disease. Vertebral arteries: Vertebral arteries patent within neck without stenosis. Left vertebral artery dominant. Skeleton: No acute bony abnormality or aggressive osseous lesion. Cervical spondylosis with multilevel disc space narrowing, disc bulges, central disc protrusions, endplate spurring and uncovertebral hypertrophy. Apparent fusion across the C6-C7 disc space. Additionally, disc space narrowing is severe at C7-T1. Prominent multilevel bridging ventral osteophytes. Other neck: 10 mm left thyroid lobe nodule not meeting consensus criteria for ultrasound follow-up. No neck mass or cervical lymphadenopathy Upper chest: No consolidation within the imaged lung apices. Review of the MIP images confirms the above findings CTA HEAD FINDINGS Anterior circulation: The intracranial internal carotid arteries are patent. Calcified plaque within both vessels with no more than mild stenosis. The M1 middle cerebral arteries are patent. No M2 proximal branch  occlusion or high-grade proximal stenosis is identified. The anterior cerebral arteries are patent. Posterior circulation: The intracranial vertebral arteries are patent. The basilar artery is patent. The posterior cerebral arteries are patent proximally. Portions of the left posterior cerebral artery are poorly delineated beyond the P2/P3 junction and this portion of the vessel may be occluded. Posterior communicating arteries are present bilaterally. Venous sinuses: Within the limitations of contrast timing, no convincing thrombus. Anatomic variants: None significant Review of the MIP images confirms the above findings These results were called by telephone at the time of interpretation on 11/02/2020 at 6:03 pm to provider [facility] , who verbally acknowledged these results. IMPRESSION: CT head: 1. Please note portions of the cerebellum and temporal lobes are excluded from the field of view inferiorly 2. Acute/early subacute left temporal occipital PCA territory infarct with mild petechial hemorrhage. 3. Background cerebral white matter chronic small vessel ischemic disease. 4. Chronic right thalamic lacunar infarct 5. Mild generalized parenchymal atrophy. CTA neck: 1. The bilateral common carotid, internal carotid and vertebral arteries are patent within the neck without stenosis. No significant atherosclerotic disease. 2. Cervical spondylosis and levels of fusion, as described. CTA head: 1. The left posterior cerebral artery is patent proximally. However, portions of the left posterior cerebral artery are poorly delineated beyond the P2/P3 junction and this portion of the vessel may be occluded. 2. Elsewhere, no intracranial large vessel occlusion or proximal high-grade arterial stenosis is  identified. 3. Calcified plaque within the intracranial internal carotid arteries bilaterally with no more than mild stenosis. Electronically Signed   By: Jackey Loge DO   On: 11/02/2020 18:05   MR BRAIN WO  CONTRAST  Result Date: 11/03/2020 CLINICAL DATA:  Stroke, follow-up EXAM: MRI HEAD WITHOUT CONTRAST TECHNIQUE: Multiplanar, multiecho pulse sequences of the brain and surrounding structures were obtained without intravenous contrast. COMPARISON:  Prior CT imaging FINDINGS: Brain: Mildly reduced diffusion in the parasagittal left occipital lobe with corresponding susceptibility. Patchy and confluent areas of T2 hyperintensity in the supratentorial and pontine white matter are nonspecific but may reflect moderate chronic microvascular ischemic changes. There are chronic small vessel infarcts of the pons, thalamus, and left cerebellum. There is no significant mass effect. No hydrocephalus or extra-axial collection. Vascular: Major vessel flow voids at the skull base are preserved. Skull and upper cervical spine: Normal marrow signal is preserved. Sinuses/Orbits: Paranasal sinuses are aerated. Orbits are unremarkable. Other: Sella is unremarkable.  Mastoid air cells are clear. IMPRESSION: Acute to subacute left occipital infarction with hemorrhagic conversion. No mass effect. Chronic microvascular ischemic changes and chronic small-vessel infarcts. Electronically Signed   By: Guadlupe Spanish M.D.   On: 11/03/2020 11:45    Scheduled Meds: . aspirin EC  81 mg Oral Daily  . enoxaparin (LOVENOX) injection  40 mg Subcutaneous Daily  . ezetimibe  10 mg Oral Daily  . insulin aspart  0-15 Units Subcutaneous TID WC  . insulin aspart  0-5 Units Subcutaneous QHS  . insulin glargine  20 Units Subcutaneous QHS  . ticagrelor  90 mg Oral BID    Continuous Infusions:   LOS: 0 days     Briant Cedar, MD [facility]  If 7PM-7AM, please contact night-coverage [email or web address] 11/03/2020, 3:16 PM

## 2020-11-03 NOTE — Consult Note (Signed)
Neurology Consultation Reason for Consult: Stroke Referring Physician: Tu, C  CC: Stroke  History is obtained from: Patient  HPI: [name] is a 60 y.o. female with a history of hypertension, hyperlipidemia, diabetes who presents with visual change that is been going on since Friday.  She also has noticed trouble with speaking, but has not noticed much trouble with reading.  She has not tried to read much however.  Due to not improving, she sought care and [facility] this evening.  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   LKW: Friday tpa given?: no, outside of window   ROS:  Unable to obtain due to aphasia.   Past Medical History:  Diagnosis Date  . Arthritis   . CAD (coronary artery disease)    s/p Taxus DES to pLAD 9/07; ant STEMI 6/10 due to late stent thrombosis treated with thrombectomy and POBA to LAD (LHC: ant apical AK, Ef 40%, dLM 20-30%, pLAD occluded at prox edge of stent (treated with PCI), R-L collats, pRCA 50%, mRCA 30%.) ;Myoview done 07/19/11: mod ant apical and inf apical scar, no ischemia, EF 45%    . Diabetes mellitus   . Hyperlipidemia   . Hypertension   . Ischemic cardiomyopathy    echo 7/10: inf septal and apical HK, EF 45-50%, mild LAE.    Marland Kitchen Myocardial infarction [facility]) [id number]     Family History  Problem Relation Age of Onset  . Lupus Mother   . Diabetes Father   . Cancer Maternal Grandmother   . Heart disease Maternal Grandfather   . Cancer Paternal Grandmother   . Arthritis Other   . Colon cancer Other   . Diabetes Other      Social History:  reports that she has quit smoking. Her smoking use included cigarettes. She quit after 15.00 years of use. She quit smokeless tobacco use about 29 years ago. She reports that she does not drink alcohol and does not use drugs.   Exam: Current vital signs: BP (!) 160/71 (BP Location: Right Arm)   Pulse 74    Temp 98 F (36.7 C) (Oral)   Resp 16   Ht 5\' 4"  (1.626 m)   Wt 108.1 kg   SpO2 100%   BMI 40.91 kg/m  Vital signs in last 24 hours: Temp:  [98 F (36.7 C)-98.1 F (36.7 C)] 98 F (36.7 C) (03/29 2205) Pulse Rate:  [74-85] 74 (03/29 2205) Resp:  [14-20] 16 (03/29 2205) BP: (116-160)/(45-109) 160/71 (03/29 2205) SpO2:  [94 %-100 %] 100 % (03/29 2205) Weight:  [108.1 kg-113 kg] 108.1 kg (03/29 2205)   Physical Exam  Constitutional: Appears well-developed and well-nourished.  Psych: Affect appropriate to situation Eyes: No scleral injection HENT: No OP obstruction MSK: no joint deformities.  Cardiovascular: Normal rate and regular rhythm.  Respiratory: Effort normal, non-labored breathing GI: Soft.  No distension. There is no tenderness.  Skin: WDI  Neuro: Mental Status: Patient is awake, alert, oriented to person, place, month, year. Her speech is very halting, with only one or two word answers, she has difficulty with complicated naming (I.e. she was able to get watch but not watchband, finger but not pinky finger).  She was unable to repeat. Cranial Nerves: II: She has a dense right hemianopia pupils are equal, round, and reactive to light.   III,IV, VI: EOMI without ptosis or diploplia.  V: Facial sensation is symmetric to  temperature VII: Facial movement is symmetric.  VIII: hearing is intact to voice X: Uvula elevates symmetrically XI: Shoulder shrug is symmetric. XII: tongue is midline without atrophy or fasciculations.  Motor: Tone is normal. Bulk is normal. 5/5 strength was present in all four extremities.  Sensory: Sensation is symmetric to light touch and temperature in the arms and legs. Cerebellar: FNF and HKS are intact bilaterally   I have reviewed labs in epic and the results pertinent to this consultation are: Glucose - 258 Cr 0.75  I have reviewed the images obtained: CT-PCA infarct with petechial hemorrhage  Impression: 60 year old female with  PCA infarct.  This could be thrombotic or embolic in nature and she will need further work-up.  Given the degree of her petechial hemorrhage, I would favor monotherapy with aspirin for the time being.  Recommendations: - HgbA1c, fasting lipid panel - MRI, MRA  of the brain without contrast - Frequent neuro checks - Echocardiogram - Carotid dopplers - Prophylactic therapy-Antiplatelet med: Aspirin -81 mg daily - Risk factor modification - Telemetry monitoring - PT consult, OT consult, Speech consult - Stroke team to follow    Ritta Slot, MD [facility] [phone]  If 7pm- 7am, please page neurology on call as listed in [facility]ION.

## 2020-11-03 NOTE — Progress Notes (Signed)
  Speech Language Pathology Treatment: Cognitive-Linquistic (Aphasia)  Patient Details Name: [name] MRN: [id number] DOB: 06/24/1961 Today's Date: 11/03/2020 Time: 1011-1027 SLP Time Calculation (min) (ACUTE ONLY): 16 min  Assessment / Plan / Recommendation Clinical Impression  Pt was seen for aphasia treatment with her brother and sister present. The session was abbreviated since the pt expressed that she would like to rest. Pt demonstrated 80% accuracy with responsive naming increasing to 100% with phonemic/part-word cues. She required intermittent verbal prompts during conversation to retrieve words, but she was noted to use gestures to augment communication. Pt and her family were educated regarding the nature of aphasia, the pt's deficits, and compensatory strategies to improve auditory comprehension, facilitate communication with the pt, and to reduce the pt's frustration. Aphasia handout was provided to reinforce areas of education. All parties verbalized understanding as well as agreement and pt's family was noted to use some of the strategies during the session. SLP will continue to follow pt.    HPI HPI: Pt is a 60 y.o. female with medical history significant for CAD s/p stent , HTN, HLD and Type 2 DM who presened with 4 days of right peripheral vision loss and new onset aphasia. CT head: Acute/early subacute left temporal occipital PCA territory infarct with mild petechial hemorrhage. Pt passed the [facility] swallow screen on 3/29 and swallow evaluation was ordered on 3/30.      SLP Plan  Continue with current plan of care  Patient needs continued Speech Lanaguage Pathology Services    Recommendations  Diet recommendations: Regular;Thin liquid Liquids provided via: Cup;Straw Medication Administration: Whole meds with liquid Supervision: Patient able to self feed Postural Changes and/or Swallow Maneuvers: Seated upright 90 degrees                Oral Care  Recommendations: Oral care BID Follow up Recommendations:  (Continued SLP services at level of care recommended by PT/OT) SLP Visit Diagnosis: Aphasia (R47.01) Plan: Continue with current plan of care       [name] I. Ariel Clock, MS, CCC-SLP Acute Rehabilitation Services Office number [phone] Pager [phone]                [name] 11/03/2020, 10:54 AM

## 2020-11-03 NOTE — Evaluation (Signed)
Clinical/Bedside Swallow Evaluation Patient Details  Name: [name] MRN: [id number] Date of Birth: 05-03-1961  Today's Date: 11/03/2020 Time: SLP Start Time (ACUTE ONLY): 8099 SLP Stop Time (ACUTE ONLY): 0954 SLP Time Calculation (min) (ACUTE ONLY): 11.22 min  Past Medical History:  Past Medical History:  Diagnosis Date  . Arthritis   . CAD (coronary artery disease)    s/p Taxus DES to pLAD 9/07; ant STEMI 6/10 due to late stent thrombosis treated with thrombectomy and POBA to LAD (LHC: ant apical AK, Ef 40%, dLM 20-30%, pLAD occluded at prox edge of stent (treated with PCI), R-L collats, pRCA 50%, mRCA 30%.) ;Myoview done 07/19/11: mod ant apical and inf apical scar, no ischemia, EF 45%    . Diabetes mellitus   . Hyperlipidemia   . Hypertension   . Ischemic cardiomyopathy    echo 7/10: inf septal and apical HK, EF 45-50%, mild LAE.    Marland Kitchen Myocardial infarction [facility]) [phone]   Past Surgical History:  Past Surgical History:  Procedure Laterality Date  . CHOLECYSTECTOMY    . CORONARY ANGIOPLASTY  [id number]  . TONSILLECTOMY    . TONSILLECTOMY     age 36  . TOTAL KNEE ARTHROPLASTY  01/23/2012   Procedure: TOTAL KNEE ARTHROPLASTY;  Surgeon: Shelda Pal, MD;  Location: [facility] ORS;  Service: Orthopedics;  Laterality: Right;   HPI:  Pt is a 60 y.o. female with medical history significant for CAD s/p stent , HTN, HLD and Type 2 DM who presened with 4 days of right peripheral vision loss and new onset aphasia. CT head: Acute/early subacute left temporal occipital PCA territory infarct with mild petechial hemorrhage. Pt passed the [facility] swallow screen on 3/29 and swallow evaluation was ordered on 3/30.   Assessment / Plan / Recommendation Clinical Impression  Pt was seen for bedside swallow evaluation and she denied a history of dysphagia. Nursing reported that she received in report that the RN was concerned about the pt's swallow function subsequent to her passing the [facility] swallow  screen. Oral mechanism exam was [facility] and dentition was adequate. She tolerated all solids and liquids without signs or symptoms of oropharyngeal dysphagia. A regular texture diet with thin liquids is recommended at this time. SLP will see once more to ensure tolerance. SLP Visit Diagnosis: Dysphagia, unspecified (R13.10)    Aspiration Risk  No limitations    Diet Recommendation Regular;Thin liquid   Liquid Administration via: Cup;Straw Medication Administration: Whole meds with liquid Supervision: Patient able to self feed Postural Changes: Seated upright at 90 degrees    Other  Recommendations Oral Care Recommendations: Oral care BID   Follow up Recommendations  (Continued SLP services at level of care recommended by PT/OT)      Frequency and Duration min 2x/week  1 week       Prognosis Prognosis for Safe Diet Advancement: Good      Swallow Study   General Date of Onset: 11/02/20 HPI: Pt is a 60 y.o. female with medical history significant for CAD s/p stent , HTN, HLD and Type 2 DM who presened with 4 days of right peripheral vision loss and new onset aphasia. CT head: Acute/early subacute left temporal occipital PCA territory infarct with mild petechial hemorrhage. Pt passed the [facility] swallow screen on 3/29 and swallow evaluation was ordered on 3/30. Type of Study: Bedside Swallow Evaluation Previous Swallow Assessment: None Diet Prior to this Study: NPO Temperature Spikes Noted: No Respiratory Status: Room air History of Recent Intubation: No  Behavior/Cognition: Alert;Cooperative;Pleasant mood Oral Cavity Assessment: Within Functional Limits Oral Care Completed by SLP: Yes Oral Cavity - Dentition: Adequate natural dentition Vision: Functional for self-feeding Self-Feeding Abilities: Able to feed self Patient Positioning: Upright in bed;Postural control adequate for testing Baseline Vocal Quality: Normal Volitional Cough: Strong Volitional Swallow: Able to elicit     Oral/Motor/Sensory Function Overall Oral Motor/Sensory Function: Within functional limits   Ice Chips Ice chips: Within functional limits Presentation: Spoon   Thin Liquid Thin Liquid: Within functional limits Presentation: Straw    Nectar Thick Nectar Thick Liquid: Not tested   Honey Thick Honey Thick Liquid: Not tested   Puree Puree: Within functional limits Presentation: Spoon   Solid     Solid: Within functional limits Presentation: Self Fed     Sorah Falkenstein I. Vear Clock, MS, CCC-SLP Acute Rehabilitation Services Office number [phone] Pager [phone]  Scheryl Marten 11/03/2020,10:29 AM

## 2020-11-03 NOTE — Progress Notes (Signed)
PT Cancellation Note  Patient Details Name: [name] MRN: [id number] DOB: 1961/05/13   Cancelled Treatment:    Reason Eval/Treat Not Completed: Patient at procedure or test/unavailable (Pt in MRI.  Will return as able. Thanks.)   [name] 11/03/2020, 11:09 AM [name] M,PT Acute Rehab Services [phone] (pager)

## 2020-11-03 NOTE — Progress Notes (Signed)
STROKE TEAM PROGRESS NOTE   INTERVAL HISTORY No acute events overnight MRI this morning confirms acute to subacute left occipital infarct with trace hemorrhagic conversion.  Chronic changes of small vessel disease.  Reports vision difficulty since 3/15 which impaired her driving and walking (running into objects). She also noticed difficulty with speech/word finding as well.  Does recall heart racing at times but denies syncopal episodes.  We discussed her stroke diagnosis, work up, plan of care including recommended medications. She was instructed not to drive until further notice due to peripheral vision loss on the right. Sister at bedside. Questions answered.  Vitals:   11/03/20 0300 11/03/20 0400 11/03/20 0500 11/03/20 0600  BP:      Pulse:      Resp: 19 15 19 17   Temp:      TempSrc:      SpO2:      Weight:      Height:       CBC:  Recent Labs  Lab 10/29/20 2009 11/02/20 1630  WBC 11.4* 11.4*  NEUTROABS  --  7.6  HGB 12.1 12.4  HCT 37.6 38.7  MCV 81.2 81.0  PLT 349 343   Basic Metabolic Panel:  Recent Labs  Lab 10/29/20 2009 11/02/20 1630  NA 139 136  K 3.0* 3.0*  CL 102 100  CO2 30 26  GLUCOSE 288* 258*  BUN 9 10  CREATININE 0.80 0.75  CALCIUM 9.0 8.7*   Lipid Panel:  Recent Labs  Lab 11/03/20 0344  CHOL 144  TRIG 100  HDL 39*  CHOLHDL 3.7  VLDL 20  LDLCALC 85   HgbA1c:  Recent Labs  Lab 11/03/20 0344  HGBA1C 10.8*   Urine Drug Screen: No results for input(s): LABOPIA, COCAINSCRNUR, LABBENZ, AMPHETMU, THCU, LABBARB in the last 168 hours.  Alcohol Level No results for input(s): ETH in the last 168 hours.  IMAGING past 24 hours CT Angio Head W or Wo Contrast  Result Date: 11/02/2020 CLINICAL DATA:  Neuro deficit, acute, stroke suspected; right-sided blurriness and speech difficulty for the last 4 days. EXAM: CT ANGIOGRAPHY HEAD AND NECK TECHNIQUE: Multidetector CT imaging of the head and neck was performed using the standard protocol during  bolus administration of intravenous contrast. Multiplanar CT image reconstructions and MIPs were obtained to evaluate the vascular anatomy. Carotid stenosis measurements (when applicable) are obtained utilizing NASCET criteria, using the distal internal carotid diameter as the denominator. CONTRAST:  100mL OMNIPAQUE IOHEXOL 350 MG/ML SOLN COMPARISON:  No pertinent prior exams available for comparison. FINDINGS: CT HEAD FINDINGS Brain: Please note portions of the cerebellum and temporal lobes are excluded from the field of view inferiorly. Mild cerebral and cerebellar atrophy. Abnormal hypodensity within the left temporal occipital lobes (PCA vascular territory) compatible with acute/early subacute cortical/subcortical infarction. This measures 5.6 x 2.0 cm in transaxial dimensions. There is mild hyperdensity within the infarction territory likely affecting petechial hemorrhage. Background patchy and ill-defined hypoattenuation within cerebral white matter which is nonspecific, but compatible with chronic small vessel ischemic disease. Chronic right thalamic lacunar infarct No extra-axial fluid collection. No evidence of intracranial mass. No midline shift. Vascular: No hyperdense vessel. Skull: Negative for fracture or focal suspicious osseous lesion at the imaged levels. Sinuses: No significant paranasal sinus disease at the imaged levels. Orbits: No orbital mass or acute finding. Review of the MIP images confirms the above findings CTA NECK FINDINGS Aortic arch: Standard aortic branching. The visualized aortic arch is normal in caliber. No hemodynamically significant innominate or  proximal subclavian artery stenosis. Right carotid system: CCA and ICA patent within the neck without stenosis. No significant atherosclerotic disease. Left carotid system: CCA and ICA patent within the neck without stenosis. No significant atherosclerotic disease. Vertebral arteries: Vertebral arteries patent within neck without  stenosis. Left vertebral artery dominant. Skeleton: No acute bony abnormality or aggressive osseous lesion. Cervical spondylosis with multilevel disc space narrowing, disc bulges, central disc protrusions, endplate spurring and uncovertebral hypertrophy. Apparent fusion across the C6-C7 disc space. Additionally, disc space narrowing is severe at C7-T1. Prominent multilevel bridging ventral osteophytes. Other neck: 10 mm left thyroid lobe nodule not meeting consensus criteria for ultrasound follow-up. No neck mass or cervical lymphadenopathy Upper chest: No consolidation within the imaged lung apices. Review of the MIP images confirms the above findings CTA HEAD FINDINGS Anterior circulation: The intracranial internal carotid arteries are patent. Calcified plaque within both vessels with no more than mild stenosis. The M1 middle cerebral arteries are patent. No M2 proximal branch occlusion or high-grade proximal stenosis is identified. The anterior cerebral arteries are patent. Posterior circulation: The intracranial vertebral arteries are patent. The basilar artery is patent. The posterior cerebral arteries are patent proximally. Portions of the left posterior cerebral artery are poorly delineated beyond the P2/P3 junction and this portion of the vessel may be occluded. Posterior communicating arteries are present bilaterally. Venous sinuses: Within the limitations of contrast timing, no convincing thrombus. Anatomic variants: None significant Review of the MIP images confirms the above findings These results were called by telephone at the time of interpretation on 11/02/2020 at 6:03 pm to provider [facility] , who verbally acknowledged these results. IMPRESSION: CT head: 1. Please note portions of the cerebellum and temporal lobes are excluded from the field of view inferiorly 2. Acute/early subacute left temporal occipital PCA territory infarct with mild petechial hemorrhage. 3. Background cerebral white  matter chronic small vessel ischemic disease. 4. Chronic right thalamic lacunar infarct 5. Mild generalized parenchymal atrophy. CTA neck: 1. The bilateral common carotid, internal carotid and vertebral arteries are patent within the neck without stenosis. No significant atherosclerotic disease. 2. Cervical spondylosis and levels of fusion, as described. CTA head: 1. The left posterior cerebral artery is patent proximally. However, portions of the left posterior cerebral artery are poorly delineated beyond the P2/P3 junction and this portion of the vessel may be occluded. 2. Elsewhere, no intracranial large vessel occlusion or proximal high-grade arterial stenosis is identified. 3. Calcified plaque within the intracranial internal carotid arteries bilaterally with no more than mild stenosis. Electronically Signed   By: Jackey Loge DO   On: 11/02/2020 18:05   CT Angio Neck W and/or Wo Contrast  Result Date: 11/02/2020 CLINICAL DATA:  Neuro deficit, acute, stroke suspected; right-sided blurriness and speech difficulty for the last 4 days. EXAM: CT ANGIOGRAPHY HEAD AND NECK TECHNIQUE: Multidetector CT imaging of the head and neck was performed using the standard protocol during bolus administration of intravenous contrast. Multiplanar CT image reconstructions and MIPs were obtained to evaluate the vascular anatomy. Carotid stenosis measurements (when applicable) are obtained utilizing NASCET criteria, using the distal internal carotid diameter as the denominator. CONTRAST:  OMNIPAQUE IOHEXOL 350 MG/ML SOLN COMPARISON:  No pertinent prior exams available for comparison. FINDINGS: CT HEAD FINDINGS Brain: Please note portions of the cerebellum and temporal lobes are excluded from the field of view inferiorly. Mild cerebral and cerebellar atrophy. Abnormal hypodensity within the left temporal occipital lobes (PCA vascular territory) compatible with acute/early subacute cortical/subcortical infarction. This  measures 5.6 x 2.0 cm in transaxial dimensions. There is mild hyperdensity within the infarction territory likely affecting petechial hemorrhage. Background patchy and ill-defined hypoattenuation within cerebral white matter which is nonspecific, but compatible with chronic small vessel ischemic disease. Chronic right thalamic lacunar infarct No extra-axial fluid collection. No evidence of intracranial mass. No midline shift. Vascular: No hyperdense vessel. Skull: Negative for fracture or focal suspicious osseous lesion at the imaged levels. Sinuses: No significant paranasal sinus disease at the imaged levels. Orbits: No orbital mass or acute finding. Review of the MIP images confirms the above findings CTA NECK FINDINGS Aortic arch: Standard aortic branching. The visualized aortic arch is normal in caliber. No hemodynamically significant innominate or proximal subclavian artery stenosis. Right carotid system: CCA and ICA patent within the neck without stenosis. No significant atherosclerotic disease. Left carotid system: CCA and ICA patent within the neck without stenosis. No significant atherosclerotic disease. Vertebral arteries: Vertebral arteries patent within neck without stenosis. Left vertebral artery dominant. Skeleton: No acute bony abnormality or aggressive osseous lesion. Cervical spondylosis with multilevel disc space narrowing, disc bulges, central disc protrusions, endplate spurring and uncovertebral hypertrophy. Apparent fusion across the C6-C7 disc space. Additionally, disc space narrowing is severe at C7-T1. Prominent multilevel bridging ventral osteophytes. Other neck: 10 mm left thyroid lobe nodule not meeting consensus criteria for ultrasound follow-up. No neck mass or cervical lymphadenopathy Upper chest: No consolidation within the imaged lung apices. Review of the MIP images confirms the above findings CTA HEAD FINDINGS Anterior circulation: The intracranial internal carotid arteries are  patent. Calcified plaque within both vessels with no more than mild stenosis. The M1 middle cerebral arteries are patent. No M2 proximal branch occlusion or high-grade proximal stenosis is identified. The anterior cerebral arteries are patent. Posterior circulation: The intracranial vertebral arteries are patent. The basilar artery is patent. The posterior cerebral arteries are patent proximally. Portions of the left posterior cerebral artery are poorly delineated beyond the P2/P3 junction and this portion of the vessel may be occluded. Posterior communicating arteries are present bilaterally. Venous sinuses: Within the limitations of contrast timing, no convincing thrombus. Anatomic variants: None significant Review of the MIP images confirms the above findings These results were called by telephone at the time of interpretation on 11/02/2020 at 6:03 pm to provider [facility] , who verbally acknowledged these results. IMPRESSION: CT head: 1. Please note portions of the cerebellum and temporal lobes are excluded from the field of view inferiorly 2. Acute/early subacute left temporal occipital PCA territory infarct with mild petechial hemorrhage. 3. Background cerebral white matter chronic small vessel ischemic disease. 4. Chronic right thalamic lacunar infarct 5. Mild generalized parenchymal atrophy. CTA neck: 1. The bilateral common carotid, internal carotid and vertebral arteries are patent within the neck without stenosis. No significant atherosclerotic disease. 2. Cervical spondylosis and levels of fusion, as described. CTA head: 1. The left posterior cerebral artery is patent proximally. However, portions of the left posterior cerebral artery are poorly delineated beyond the P2/P3 junction and this portion of the vessel may be occluded. 2. Elsewhere, no intracranial large vessel occlusion or proximal high-grade arterial stenosis is identified. 3. Calcified plaque within the intracranial internal carotid  arteries bilaterally with no more than mild stenosis. Electronically Signed   By: Jackey Loge DO   On: 11/02/2020 18:05    PHYSICAL EXAM  Constitutional: Appears well-developed and well-nourished.  Psych: Affect appropriate to situation  Eyes: No scleral injection  HENT: No OP obstruction  MSK: no joint  deformities.  Cardiovascular: Normal rate and regular rhythm.  Respiratory: Effort normal, non-labored breathing  GI: Soft. No distension. There is no tenderness.  Skin: WDI  Neuro:  Mental Status:  Patient is awake, alert, oriented to person, place, month, year.  Aphasia, expressive, with word finding difficulty and substitutions.  Cranial Nerves:  II: She has a dense right hemianopia pupils are equal, round, and reactive to light.  III,IV, VI: EOMI without ptosis or diploplia.  V: Facial sensation is symmetric to temperature  VII: Facial movement is symmetric.  VIII: hearing is intact to voice  X: Uvula elevates symmetrically  XI: Shoulder shrug is symmetric.  XII: tongue is midline without atrophy or fasciculations.  Motor:  Tone is normal. Bulk is normal. 5/5 strength was present in all four extremities.  Sensory:  Sensation is symmetric to light touch and temperature in the arms and legs.  Cerebellar:  FNF and HKS are intact bilaterally   ASSESSMENT/PLAN [name] is a 60 y.o. female with a history of HTN,, HLD, DM2, Morbid obesity, CAD: Taxus drug eluting stent placed in the LAD in 2007 , Acute anterior STEMI June 2010 with very late thrombosis of the Taxus stent in the proximal LAD s/p balloon angioplasty, ischemic cardiomyopathy, Statin intolerance (declined PCSK9), who presents with visual change that is been going on since Friday 3/25.  She also has noticed trouble with speaking, but has not noticed much trouble with reading.   Due to not improving, she sought care and med [facility]  on 3/29. There a CT/CTA was performed which demonstrates a sizable PCA  infarct on the left with some petechial hemorrhage 60 year old female with PCA infarct. Given the degree of her petechial hemorrhage, monotherapy with aspirin for now was implemented.   Stroke: Left PCA stroke with occipital infarction and hemorrhagic conversion concerning for embolic source.    Code Stroke PCA infarct with petechial hemorrhage  CTA head  The left posterior cerebral artery is patent proximally. However, portions of the left posterior cerebral artery are poorly delineated beyond the P2/P3 junction and this portion of the vessel may be occluded. 2. Elsewhere, no intracranial large vessel occlusion or proximal high-grade arterial stenosis is identified. 3. Calcified plaque within the intracranial internal carotid arteries bilaterally with no more than mild stenosis.  CTA neck  The bilateral common carotid, internal carotid and vertebral arteries are patent within the neck without stenosis. No significant atherosclerotic disease. Cervical spondylosis and levels of fusion, as described.  MRI  Acute to subacute left occipital infarction with hemorrhagic conversion. No mass effect.  2D Echo Performed 10/22/20 EF 45-50%, +regional wall abnormalities, There is severe akinesis of the left ventricular, mid-apical apical segment and anteroseptal wall. Findings suggest mid to distal LAD infarct/scar. Grade I diastolic dysfunction. Mild mitral valve regurgitation. No thrombus or shunt.     LDL 85  HgbA1c 10.8  VTE prophylaxis - Lovenox ppx    Diet   Diet NPO time specified     On ASA 81mg  and Plavix 75 mg daily prior to admission  Preventive therapy: ASA 81mg  and Brilinta x 30 days then Brilinta alone if this is feasible financially otherwise she would need to return to previous ASA 81 and Plavix regimen. Ok to initiate today as hemorrhagic conversion is not clinically significant.   Therapy recommendations:  TBD  Disposition:  Pending  Hypertension . Permissive  hypertension (OK if < 220/120) but gradually normalize in 5-7 days . Long-term BP goal normotensive  Hyperlipidemia  Home meds: Statin intolerant, has declined PCSK9.   LDL 85, not quite at goal < 70  Add Zetia 10mg  daily   High intensity statin is not recommended as cardiologist has documented statin intolerance.   Continue statin at discharge  Diabetes type II Uncontrolled  HgbA1c 10.8, goal < 7.0  CBGs Recent Labs    11/03/20 0138  GLUCAP 119*      SSI  Right visual field deficit  No driving until further notice  PT/OT  Other Stroke Risk Factors  Former Cigarette and smokeless tobacco user   Obesity, Body mass index is 40.91 kg/m., BMI >/= 30 associated with increased stroke risk, recommend weight loss, diet and exercise as appropriate   Coronary artery disease  Other Active Problems     Hospital day # 0  I have personally obtained history,examined this patient, reviewed notes, independently viewed imaging studies, participated in medical decision making and plan of care.ROS completed by me personally and pertinent positives fully documented  I have made any additions or clarifications directly to the above note. Agree with note above.  He presented with subacute vision difficulties due to left PCA infarct likely of embolic etiology.  Recommend aspirin and Brilinta for 4 weeks and continue later if she can afford it since she was already on Plavix prior to the stroke but if not she could go back to aspirin and Plavix after 4 weeks.  Aggressive risk factor modification.  Long discussion with patient and answered questions.  Greater than 50% time during this 35-minute visit was spent in counseling and coordination of care about her cryptogenic stroke and answering questions.  11/05/20, MD Medical Director [facility] Pager: [phone] 11/03/2020 4:05 PM   To contact Stroke Continuity provider, please refer to 11/05/2020. After hours,  contact General Neurology

## 2020-11-03 NOTE — Care Management Obs Status (Signed)
MEDICARE OBSERVATION STATUS NOTIFICATION   Patient Details  Name: [name] MRN: [id number] Date of Birth: [birth year]   Medicare Observation Status Notification Given:  Yes    Lorri Frederick, LCSW 11/03/2020, 3:59 PM

## 2020-11-04 DIAGNOSIS — I6300 Cerebral infarction due to thrombosis of unspecified precerebral artery: Secondary | ICD-10-CM | POA: Diagnosis not present

## 2020-11-04 DIAGNOSIS — I2510 Atherosclerotic heart disease of native coronary artery without angina pectoris: Secondary | ICD-10-CM

## 2020-11-04 DIAGNOSIS — R4701 Aphasia: Secondary | ICD-10-CM | POA: Diagnosis not present

## 2020-11-04 LAB — GLUCOSE, CAPILLARY
Glucose-Capillary: 219 mg/dL — ABNORMAL HIGH (ref 70–99)
Glucose-Capillary: 252 mg/dL — ABNORMAL HIGH (ref 70–99)
Glucose-Capillary: 154 mg/dL — ABNORMAL HIGH (ref 70–99)
Glucose-Capillary: 326 mg/dL — ABNORMAL HIGH (ref 70–99)

## 2020-11-04 LAB — CBC WITH DIFFERENTIAL/PLATELET
Abs Immature Granulocytes: 0.02 10*3/uL (ref 0.00–0.07)
Basophils Absolute: 0 10*3/uL (ref 0.0–0.1)
Basophils Relative: 0 %
Eosinophils Absolute: 0.1 10*3/uL (ref 0.0–0.5)
Eosinophils Relative: 1 %
HCT: 38.3 % (ref 36.0–46.0)
Hemoglobin: 12 g/dL (ref 12.0–15.0)
Immature Granulocytes: 0 %
Lymphocytes Relative: 30 %
Lymphs Abs: 2.8 10*3/uL (ref 0.7–4.0)
MCH: 25.8 pg — ABNORMAL LOW (ref 26.0–34.0)
MCHC: 31.3 g/dL (ref 30.0–36.0)
MCV: 82.2 fL (ref 80.0–100.0)
Monocytes Absolute: 0.6 10*3/uL (ref 0.1–1.0)
Monocytes Relative: 6 %
Neutro Abs: 5.6 10*3/uL (ref 1.7–7.7)
Neutrophils Relative %: 63 %
Platelets: 326 10*3/uL (ref 150–400)
RBC: 4.66 MIL/uL (ref 3.87–5.11)
RDW: 14.5 % (ref 11.5–15.5)
WBC: 9.1 10*3/uL (ref 4.0–10.5)
nRBC: 0 % (ref 0.0–0.2)

## 2020-11-04 LAB — BASIC METABOLIC PANEL
Anion gap: 7 (ref 5–15)
BUN: 7 mg/dL — ABNORMAL LOW (ref 8–23)
CO2: 28 mmol/L (ref 22–32)
Calcium: 8.8 mg/dL — ABNORMAL LOW (ref 8.9–10.3)
Chloride: 104 mmol/L (ref 98–111)
Creatinine, Ser: 0.8 mg/dL (ref 0.44–1.00)
GFR, Estimated: >60 mL/min (ref >60)
Glucose, Bld: 198 mg/dL — ABNORMAL HIGH (ref 70–99)
Potassium: 3.2 mmol/L — ABNORMAL LOW (ref 3.5–5.1)
Sodium: 139 mmol/L (ref 135–145)

## 2020-11-04 LAB — URINE CULTURE

## 2020-11-04 MED ORDER — INSULIN GLARGINE 100 UNIT/ML ~~LOC~~ SOLN
30.0000 [IU] | Freq: Every day | SUBCUTANEOUS | Status: DC
  Administered 2020-11-04: 30 [IU] via SUBCUTANEOUS
  Filled 2020-11-04 (×2): qty 0.3

## 2020-11-04 MED ORDER — POTASSIUM CHLORIDE CRYS ER 20 MEQ PO TBCR: 40.0000 meq | EXTENDED_RELEASE_TABLET | Freq: Once | ORAL | Status: DC

## 2020-11-04 MED ORDER — POTASSIUM CHLORIDE CRYS ER 20 MEQ PO TBCR
40.0000 meq | EXTENDED_RELEASE_TABLET | Freq: Once | ORAL | Status: AC
  Administered 2020-11-04: 40 meq via ORAL
  Filled 2020-11-04: qty 2

## 2020-11-04 MED ORDER — INSULIN ASPART 100 UNIT/ML ~~LOC~~ SOLN
3.0000 [IU] | Freq: Three times a day (TID) | SUBCUTANEOUS | Status: DC
  Administered 2020-11-04: 3 [IU] via SUBCUTANEOUS

## 2020-11-04 NOTE — TOC Benefit Eligibility Note (Signed)
Transition of Care [facility]) Benefit Eligibility Note    Patient Details  Name: [name] MRN: [id number] Date of Birth: 1960/12/11   Medication/Dose: Brilinta 90 mg. bid for 30 day supply  Covered?: Yes  Tier:  (?)  Prescription Coverage Preferred Pharmacy: [facility] with Person/Company/Phone Number:: Janae Bridgeman. W/[facility] Ph# [phone]  Co-Pay: $47.00  Prior Approval: No  Deductible:  (no-deductible)       Renie Ora Phone Number: 11/04/2020, 2:45 PM

## 2020-11-04 NOTE — H&P (View-Only) (Signed)
PROGRESS NOTE  [name] YQM:[id number] DOB: 09-Jun-1961 DOA: 11/02/2020 PCP: [name], MD  HPI/Recap of past 24 hours: HPI from Dr [name] is a 60 y.o. female with medical history significant for CAD s/p stent , HTN, HLD and Type 2 DM who presents as a transfer from [facility] with 4 days of right peripheral vision loss and new onset aphasia. Patient had difficulty recalling history given aphasia and difficulty with word finding.  History mostly obtained from documentation that was given by her sister who is not at bedside at this time. Patient is right-handed and was seen for her decreased right peripheral vision vision on 3/25 in the ED but due to extreme claustrophobia to MRI she declined further work-up and wanted follow-up with her ophthalmologist. She presented back to the ED because of the addition of new onset aphasia and difficulty word finding. In the ED, was afebrile and hypertensive up to 160s systolic. CTA head and neck showed acute/early subactue left temporal occipital PCA territory infarct with mild petechial hemorrhage. Parts of left posterior cerebral artery may also be occluded.  Neurology consulted.  Patient admitted for further management.     Today, patient denies any new complaints, aphasia seems to be improving, still with peripheral vision loss on the right, denies any chest pain, abdominal pain, shortness of breath, palpitations, nausea/vomiting, fever/chills.    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 MRI showed above with hemorrhagic conversion, no mass-effect noted CTA head showed no LVO but occluded P2/P3 junction of the left posterior cerebral artery Echo done on 10/22/2020 showed EF of 45 to 50%, grade 1 diastolic dysfunction,  severe akinesis of the left ventricular anterior septal wall, mild mitral valve regurgitation Plan for TEE, loop recorder placement on 11/05/20  LDL 85, A1c 10.8 Neurology/stroke team consulted, appreciate recs PT/OT/SLP Frequent neurochecks, telemetry Continue aspirin, started on Brilinta, discontinued home Plavix  Hypokalemia Replace as needed  Chronic combined systolic and diastolic HF Appears euvolemic Echo with EF of 45 to 50%, grade 1 diastolic dysfunction Hold home Coreg, losartan for now  Hypertension Allow for permissive hypertension for now Hold home Coreg, losartan for now  Diabetes mellitus type 2, uncontrolled A1c 10.8 Continue SSI, glargine, Accu-Cheks, hypoglycemic protocol Hold home regimen for now  CAD s/p stent/hyperlipidemia Chest pain-free Continue aspirin, Zetia, now brillanta    Estimated body mass index is 40.91 kg/m as calculated from the following:   Height as of this encounter: 5\' 4"  (1.626 m).   Weight as of this encounter: 108.1 kg.     Code Status: Full  Family Communication: Discussed with sister at bedside on 11/04/2020  Disposition Plan: Status is: Observation  The patient remains OBS appropriate and will d/c before 2 midnights.  Dispo: The patient is from: Home              Anticipated d/c is to: Home              Patient currently is not medically stable to d/c.   Difficult to place patient No    Consultants:  Neurology  Procedures:  None  Antimicrobials:  None  DVT prophylaxis: Lovenox   Objective: Vitals:   11/03/20 2249 11/04/20 0830 11/04/20 1043 11/04/20 1045  BP: (!) 136/57  (!) 147/85 (!) 142/71  Pulse: 85 84    Resp: 16 17 18  (!) 21  Temp: 98.6 F (37 C)  98.5 F (36.9 C)   TempSrc: Oral  Oral   SpO2: 98%  99%   Weight:      Height:       No intake or output data in the 24 hours ending 11/04/20 1546 Filed Weights   11/02/20 1606 11/02/20 2205  Weight: 113 kg 108.1 kg    Exam:  General:  NAD, alert, awake, oriented, noted aphasia, although slight improvement  Cardiovascular: S1, S2 present  Respiratory: CTAB  Abdomen: Soft, nontender, nondistended, bowel sounds present  Musculoskeletal: No bilateral pedal edema noted  Skin: Normal  Psychiatry: Normal mood  Neurology: Strength 5/5 in all 4 extremities, sensation intact, noted aphasia, comprehension intact, noted right hemianopia   Data Reviewed: CBC: Recent Labs  Lab 10/29/20 2009 11/02/20 1630 11/03/20 0344 11/04/20 0204  WBC 11.4* 11.4* 10.9* 9.1  NEUTROABS  --  7.6 6.6 5.6  HGB 12.1 12.4 12.0 12.0  HCT 37.6 38.7 38.9 38.3  MCV 81.2 81.0 83.3 82.2  PLT 349 343 350 326   Basic Metabolic Panel: Recent Labs  Lab 10/29/20 2009 11/02/20 1630 11/03/20 0344 11/04/20 0204  NA 139 136 139 139  K 3.0* 3.0* 2.9* 3.2*  CL 102 100 104 104  CO2 30 26 27 28   GLUCOSE 288* 258* 109* 198*  BUN 9 10 6* 7*  CREATININE 0.80 0.75 0.59 0.80  CALCIUM 9.0 8.7* 8.9 8.8*  MG  --   --  1.7  --    GFR: Estimated Creatinine Clearance: 87.6 mL/min (by C-G formula based on SCr of 0.8 mg/dL). Liver Function Tests: Recent Labs  Lab 11/02/20 1630  AST 17  ALT 12  ALKPHOS 68  BILITOT 0.4  PROT 7.0  ALBUMIN 3.1*   No results for input(s): LIPASE, AMYLASE in the last 168 hours. No results for input(s): AMMONIA in the last 168 hours. Coagulation Profile: Recent Labs  Lab 11/02/20 1630  INR 1.0   Cardiac Enzymes: No results for input(s): CKTOTAL, CKMB, CKMBINDEX, TROPONINI in the last 168 hours. BNP (last 3 results) No results for input(s): PROBNP in the last 8760 hours. HbA1C: Recent Labs    11/03/20 0344  HGBA1C 10.8*   CBG: Recent Labs  Lab 11/03/20 1226 11/03/20 1657 11/03/20 2010 11/04/20 0718 11/04/20 1122  GLUCAP 153* 247* 240* 219* 252*   Lipid Profile: Recent Labs    11/03/20 0344  CHOL 144  HDL 39*  LDLCALC 85  TRIG 161100  CHOLHDL 3.7   Thyroid Function Tests: Recent Labs     11/02/20 1630  TSH 3.097   Anemia Panel: No results for input(s): VITAMINB12, FOLATE, FERRITIN, TIBC, IRON, RETICCTPCT in the last 72 hours. Urine analysis:    Component Value Date/Time   COLORURINE YELLOW 11/02/2020 1630   APPEARANCEUR CLOUDY (A) 11/02/2020 1630   LABSPEC 1.015 11/02/2020 1630   PHURINE 6.0 11/02/2020 1630   GLUCOSEU 250 (A) 11/02/2020 1630   HGBUR NEGATIVE 11/02/2020 1630   BILIRUBINUR NEGATIVE 11/02/2020 1630   KETONESUR NEGATIVE 11/02/2020 1630   PROTEINUR NEGATIVE 11/02/2020 1630   UROBILINOGEN 0.2 01/18/2012 0932   NITRITE NEGATIVE 11/02/2020 1630   LEUKOCYTESUR SMALL (A) 11/02/2020 1630   Sepsis Labs: @LABRCNTIP (procalcitonin:4,lacticidven:4)  ) Recent Results (from the past 240 hour(s))  Urine culture     Status: Abnormal   Collection Time: 11/02/20  4:30 PM   Specimen: Urine, Random  Result Value Ref Range  Status   Specimen Description   Final    URINE, RANDOM Performed at [facility], [address] [place], [place] [address]    Special Requests   Final    NONE Performed at [facility], [address] [place], [place] [address]    Culture MULTIPLE SPECIES PRESENT, SUGGEST RECOLLECTION (A)  Final   Report Status 11/04/2020 FINAL  Final  Resp Panel by RT-PCR (Flu A&B, Covid) Nasopharyngeal Swab     Status: None   Collection Time: 11/02/20  7:22 PM   Specimen: Nasopharyngeal Swab; Nasopharyngeal(NP) swabs in vial transport medium  Result Value Ref Range Status   SARS Coronavirus 2 by RT PCR NEGATIVE NEGATIVE Final    Comment: (NOTE) SARS-CoV-2 target nucleic acids are NOT DETECTED.  The SARS-CoV-2 RNA is generally detectable in upper respiratory specimens during the acute phase of infection. The lowest concentration of SARS-CoV-2 viral copies this assay can detect is 138 copies/mL. A negative result does not preclude SARS-Cov-2 infection and should not be used as the sole basis for treatment or other patient  management decisions. A negative result may occur with  improper specimen collection/handling, submission of specimen other than nasopharyngeal swab, presence of viral mutation(s) within the areas targeted by this assay, and inadequate number of viral copies(<138 copies/mL). A negative result must be combined with clinical observations, patient history, and epidemiological information. The expected result is Negative.  Fact Sheet for Patients:  BloggerCourse.com  Fact Sheet for Healthcare Providers:  SeriousBroker.it  This test is no t yet approved or cleared by the [place] FDA and  has been authorized for detection and/or diagnosis of SARS-CoV-2 by FDA under an Emergency Use Authorization (EUA). This EUA will remain  in effect (meaning this test can be used) for the duration of the COVID-19 declaration under Section 564(b)(1) of the Act, 21 U.S.C.section 360bbb-3(b)(1), unless the authorization is terminated  or revoked sooner.       Influenza A by PCR NEGATIVE NEGATIVE Final   Influenza B by PCR NEGATIVE NEGATIVE Final    Comment: (NOTE) The Xpert Xpress SARS-CoV-2/FLU/RSV plus assay is intended as an aid in the diagnosis of influenza from Nasopharyngeal swab specimens and should not be used as a sole basis for treatment. Nasal washings and aspirates are unacceptable for Xpert Xpress SARS-CoV-2/FLU/RSV testing.  Fact Sheet for Patients: BloggerCourse.com  Fact Sheet for Healthcare Providers: SeriousBroker.it  This test is not yet approved or cleared by the [place] FDA and has been authorized for detection and/or diagnosis of SARS-CoV-2 by FDA under an Emergency Use Authorization (EUA). This EUA will remain in effect (meaning this test can be used) for the duration of the COVID-19 declaration under Section 564(b)(1) of the Act, 21 U.S.C. section 360bbb-3(b)(1),  unless the authorization is terminated or revoked.  Performed at [facility], [address] [place], [place] [address]       Studies: No results found.  Scheduled Meds: . aspirin EC  81 mg Oral Daily  . enoxaparin (LOVENOX) injection  40 mg Subcutaneous Daily  . ezetimibe  10 mg Oral Daily  . insulin aspart  0-15 Units Subcutaneous TID WC  . insulin aspart  0-5 Units Subcutaneous QHS  . insulin aspart  3 Units Subcutaneous TID WC  . insulin glargine  30 Units Subcutaneous QHS  . ticagrelor  90 mg Oral BID    Continuous Infusions:   LOS: 0 days     Warrick Parisian  Rayann Heman, MD [facility]  If 7PM-7AM, please contact night-coverage [email or web address] 11/04/2020, 3:46 PM

## 2020-11-04 NOTE — Consult Note (Addendum)
ELECTROPHYSIOLOGY CONSULT NOTE  Patient ID: [name] MRN: [id number], DOB/AGE: September 10, 1960   Admit date: 11/02/2020 Date of Consult: 11/04/2020  Primary Physician: [name], MD Primary Cardiologist: Clifton James Reason for Consultation: Cryptogenic stroke ; recommendations regarding Implantable Loop Recorder requested by Dr. Pearlean Brownie  History of Present Illness [name] was admitted on 11/02/2020 with with visual changes and difficult speech/stroke.    PMHx includes:HTN, HLD, DM, obesity, CAD  Neurology notes: Left PCA stroke with occipital infarction and hemorrhagic conversion concerning for embolic source.  she has undergone workup for stroke including echocardiogram and carotid angio.  The patient has been monitored on telemetry which has demonstrated sinus rhythm with no arrhythmias.  Inpatient stroke work-up is to be completed with a TEE.   Echocardiogram this admission demonstrated IMPRESSIONS  1. Left ventricular ejection fraction, by estimation, is 45 to 50%. The  left ventricle has mildly decreased function. The left ventricle  demonstrates regional wall motion abnormalities (see scoring  diagram/findings for description). Left ventricular  diastolic parameters are consistent with Grade I diastolic dysfunction  (impaired relaxation). There is severe akinesis of the left ventricular,  mid-apical apical segment and anteroseptal wall. Findings suggest mid to  distal LAD infarct/scar.  2. Right ventricular systolic function is normal. The right ventricular  size is normal.  3. The mitral valve is abnormal. Mild mitral valve regurgitation.  4. The aortic valve is tricuspid. Aortic valve regurgitation is not  visualized.  5. The inferior vena cava is normal in size with greater than 50%  respiratory variability, suggesting right atrial pressure of 3 mmHg.  Comparison(s): No prior Echocardiogram.    Lab work is reviewed. Hypokalemia deferred to  attending team VSS/HTN deferred to attending team   Prior to admission, the patient denies chest pain, shortness of breath, dizziness, palpitations, or syncope.  They are recovering from their stroke with plans to home at discharge.      Past Medical History:  Diagnosis Date  . Arthritis   . CAD (coronary artery disease)    s/p Taxus DES to pLAD 9/07; ant STEMI 6/10 due to late stent thrombosis treated with thrombectomy and POBA to LAD (LHC: ant apical AK, Ef 40%, dLM 20-30%, pLAD occluded at prox edge of stent (treated with PCI), R-L collats, pRCA 50%, mRCA 30%.) ;Myoview done 07/19/11: mod ant apical and inf apical scar, no ischemia, EF 45%    . Diabetes mellitus   . Hyperlipidemia   . Hypertension   . Ischemic cardiomyopathy    echo 7/10: inf septal and apical HK, EF 45-50%, mild LAE.    Marland Kitchen Myocardial infarction [facility]) [id number]     Surgical History:  Past Surgical History:  Procedure Laterality Date  . CHOLECYSTECTOMY    . CORONARY ANGIOPLASTY  9038,3338  . TONSILLECTOMY    . TONSILLECTOMY     age 3  . TOTAL KNEE ARTHROPLASTY  01/23/2012   Procedure: TOTAL KNEE ARTHROPLASTY;  Surgeon: Shelda Pal, MD;  Location: [facility];  Service: Orthopedics;  Laterality: Right;     Medications Prior to Admission  Medication Sig Dispense Refill Last Dose  . aspirin 81 MG tablet Take 81 mg by mouth daily with breakfast.   11/01/2020 at Unknown time  . carvedilol (COREG) 6.25 MG tablet Take 1 tablet (6.25 mg total) by mouth 2 (two) times daily with a meal. 60 tablet 11 11/01/2020 at Unknown time  . clopidogrel (PLAVIX) 75 MG tablet TAKE 1 TABLET (75 MG TOTAL) BY MOUTH DAILY.  30 tablet 0 11/01/2020 at Unknown time  . cyclobenzaprine (FLEXERIL) 10 MG tablet Take 10 mg by mouth 2 (two) times daily as needed for muscle spasms.   Past Week at Unknown time  . glyBURIDE-metformin (GLUCOVANCE) 5-500 MG per tablet Take 2 tablets by mouth 2 (two) times daily with a meal.   11/01/2020 at Unknown time  .  insulin degludec (TRESIBA FLEXTOUCH) 200 UNIT/ML FlexTouch Pen Inject 50 Units into the skin daily.   11/01/2020 at Unknown time  . metFORMIN (GLUCOPHAGE) 1000 MG tablet Take 1,000 mg by mouth 2 (two) times daily.   Past Week at Unknown time  . ONE TOUCH ULTRA TEST test strip    11/01/2020 at Unknown time  . traMADol (ULTRAM) 50 MG tablet Take 50 mg by mouth every 12 (twelve) hours as needed for moderate pain.   Past Week at Unknown time  . valsartan (DIOVAN) 80 MG tablet Take 80 mg by mouth at bedtime.   11/01/2020 at Unknown time    Inpatient Medications:  . aspirin EC  81 mg Oral Daily  . enoxaparin (LOVENOX) injection  40 mg Subcutaneous Daily  . ezetimibe  10 mg Oral Daily  . insulin aspart  0-15 Units Subcutaneous TID WC  . insulin aspart  0-5 Units Subcutaneous QHS  . insulin aspart  3 Units Subcutaneous TID WC  . insulin glargine  30 Units Subcutaneous QHS  . ticagrelor  90 mg Oral BID    Allergies:  Allergies  Allergen Reactions  . Ozempic (0.25 Or 0.5 Mg-Dose)  [Semaglutide(0.25 Or 0.5mg -Dos)] Itching    Social History   Socioeconomic History  . Marital status: Single    Spouse name: Not on file  . Number of children: Not on file  . Years of education: Not on file  . Highest education level: Not on file  Occupational History  . Not on file  Tobacco Use  . Smoking status: Former Smoker    Years: 15.00    Types: Cigarettes  . Smokeless tobacco: Former Neurosurgeon    Quit date: 01/18/1991  . Tobacco comment: QUIT 21 YEARS AGO  Substance and Sexual Activity  . Alcohol use: No  . Drug use: No  . Sexual activity: Not on file  Other Topics Concern  . Not on file  Social History Narrative  . Not on file   Social Determinants of Health   Financial Resource Strain: Not on file  Food Insecurity: Not on file  Transportation Needs: Not on file  Physical Activity: Not on file  Stress: Not on file  Social Connections: Not on file  Intimate Partner Violence: Not on file      Family History  Problem Relation Age of Onset  . Lupus Mother   . Diabetes Father   . Cancer Maternal Grandmother   . Heart disease Maternal Grandfather   . Cancer Paternal Grandmother   . Arthritis Other   . Colon cancer Other   . Diabetes Other       Review of Systems: All other systems reviewed and are otherwise negative except as noted above.  Physical Exam: Vitals:   11/03/20 2249 11/04/20 0830 11/04/20 1043 11/04/20 1045  BP: (!) 136/57  (!) 147/85 (!) 142/71  Pulse: 85 84    Resp: 16 17 18  (!) 21  Temp: 98.6 F (37 C)  98.5 F (36.9 C)   TempSrc: Oral  Oral   SpO2: 98%  99%   Weight:      Height:  GEN- The patient is well appearing, alert and oriented x 3 today.   Head- normocephalic, atraumatic Eyes-  Sclera clear, conjunctiva pink Ears- hearing intact Oropharynx- clear Neck- supple Lungs- CTA b/l, normal work of breathing Heart- RRR, no murmurs, rubs or gallops  GI- soft, NT, ND Extremities- no clubbing, cyanosis, or edema MS- no significant deformity or atrophy Skin- no rash or lesion Psych- euthymic mood, full affect   Labs:   Lab Results  Component Value Date   WBC 9.1 11/04/2020   HGB 12.0 11/04/2020   HCT 38.3 11/04/2020   MCV 82.2 11/04/2020   PLT 326 11/04/2020    Recent Labs  Lab 11/02/20 1630 11/03/20 0344 11/04/20 0204  NA 136   < > 139  K 3.0*   < > 3.2*  CL 100   < > 104  CO2 26   < > 28  BUN 10   < > 7*  CREATININE 0.75   < > 0.80  CALCIUM 8.7*   < > 8.8*  PROT 7.0  --   --   BILITOT 0.4  --   --   ALKPHOS 68  --   --   ALT 12  --   --   AST 17  --   --   GLUCOSE 258*   < > 198*   < > = values in this interval not displayed.   Lab Results  Component Value Date   CKTOTAL 499 (H) 01/16/2009   CKMB 20.0 (H) 01/16/2009   TROPONINI (HH) 01/15/2009    15.64        POSSIBLE MYOCARDIAL ISCHEMIA. SERIAL TESTING RECOMMENDED. CRITICAL VALUE NOTED.  VALUE IS CONSISTENT WITH PREVIOUSLY REPORTED AND CALLED VALUE.    Lab Results  Component Value Date   CHOL 144 11/03/2020   CHOL  01/15/2009    136        ATP III CLASSIFICATION:  <200     mg/dL   Desirable  161-096200-239  mg/dL   Borderline High  >=045>=240    mg/dL   High          CHOL 409114 07/10/2008   Lab Results  Component Value Date   HDL 39 (L) 11/03/2020   HDL 38 (L) 01/15/2009   HDL 28.9 (L) 07/10/2008   Lab Results  Component Value Date   LDLCALC 85 11/03/2020   LDLCALC  01/15/2009    81        Total Cholesterol/HDL:CHD Risk Coronary Heart Disease Risk Table                     Men   Women  1/2 Average Risk   3.4   3.3  Average Risk       5.0   4.4  2 X Average Risk   9.6   7.1  3 X Average Risk  23.4   11.0        Use the calculated Patient Ratio above and the CHD Risk Table to determine the patient's CHD Risk.        ATP III CLASSIFICATION (LDL):  <100     mg/dL   Optimal  [id number]  mg/dL   Near or Above                    Optimal  130-159  mg/dL   Borderline  782-956160-189  mg/dL   High  >213>190     mg/dL   Very High   LDLCALC  75 07/10/2008   Lab Results  Component Value Date   TRIG 100 11/03/2020   TRIG 83 01/15/2009   TRIG 50 07/10/2008   Lab Results  Component Value Date   CHOLHDL 3.7 11/03/2020   CHOLHDL 3.6 01/15/2009   CHOLHDL 3.9 CALC 07/10/2008   No results found for: LDLDIRECT  No results found for: DDIMER   Radiology/Studies:   CT Angio Head W or Wo Contrast Result Date: 11/02/2020 CLINICAL DATA:  Neuro deficit, acute, stroke suspected; right-sided blurriness and speech difficulty for the last 4 days. EXAM: CT ANGIOGRAPHY HEAD AND NECK TECHNIQUE: Multidetector CT imaging of the head and neck was performed using the standard protocol during bolus administration of intravenous contrast. Multiplanar CT image reconstructions and MIPs were obtained to evaluate the vascular anatomy. Carotid stenosis measurements (when applicable) are obtained utilizing NASCET criteria, using the distal internal carotid diameter as the  denominator. CONTRAST:  OMNIPAQUE IOHEXOL 350 MG/ML SOLN COMPARISON:  No pertinent prior exams available for comparison. FINDINGS: CT HEAD FINDINGS Brain: Please note portions of the cerebellum and temporal lobes are excluded from the field of view inferiorly. Mild cerebral and cerebellar atrophy. Abnormal hypodensity within the left temporal occipital lobes (PCA vascular territory) compatible with acute/early subacute cortical/subcortical infarction. This measures 5.6 x 2.0 cm in transaxial dimensions. There is mild hyperdensity within the infarction territory likely affecting petechial hemorrhage. Background patchy and ill-defined hypoattenuation within cerebral white matter which is nonspecific, but compatible with chronic small vessel ischemic disease. Chronic right thalamic lacunar infarct No extra-axial fluid collection. No evidence of intracranial mass. No midline shift. Vascular: No hyperdense vessel. Skull: Negative for fracture or focal suspicious osseous lesion at the imaged levels. Sinuses: No significant paranasal sinus disease at the imaged levels. Orbits: No orbital mass or acute finding. Review of the MIP images confirms the above findings CTA NECK FINDINGS Aortic arch: Standard aortic branching. The visualized aortic arch is normal in caliber. No hemodynamically significant innominate or proximal subclavian artery stenosis. Right carotid system: CCA and ICA patent within the neck without stenosis. No significant atherosclerotic disease. Left carotid system: CCA and ICA patent within the neck without stenosis. No significant atherosclerotic disease. Vertebral arteries: Vertebral arteries patent within neck without stenosis. Left vertebral artery dominant. Skeleton: No acute bony abnormality or aggressive osseous lesion. Cervical spondylosis with multilevel disc space narrowing, disc bulges, central disc protrusions, endplate spurring and uncovertebral hypertrophy. Apparent fusion across the  C6-C7 disc space. Additionally, disc space narrowing is severe at C7-T1. Prominent multilevel bridging ventral osteophytes. Other neck: 10 mm left thyroid lobe nodule not meeting consensus criteria for ultrasound follow-up. No neck mass or cervical lymphadenopathy Upper chest: No consolidation within the imaged lung apices. Review of the MIP images confirms the above findings CTA HEAD FINDINGS Anterior circulation: The intracranial internal carotid arteries are patent. Calcified plaque within both vessels with no more than mild stenosis. The M1 middle cerebral arteries are patent. No M2 proximal branch occlusion or high-grade proximal stenosis is identified. The anterior cerebral arteries are patent. Posterior circulation: The intracranial vertebral arteries are patent. The basilar artery is patent. The posterior cerebral arteries are patent proximally. Portions of the left posterior cerebral artery are poorly delineated beyond the P2/P3 junction and this portion of the vessel may be occluded. Posterior communicating arteries are present bilaterally. Venous sinuses: Within the limitations of contrast timing, no convincing thrombus. Anatomic variants: None significant Review of the MIP images confirms the above findings These results were called by telephone at  the time of interpretation on 11/02/2020 at 6:03 pm to provider [facility] , who verbally acknowledged these results. IMPRESSION: CT head: 1. Please note portions of the cerebellum and temporal lobes are excluded from the field of view inferiorly 2. Acute/early subacute left temporal occipital PCA territory infarct with mild petechial hemorrhage. 3. Background cerebral white matter chronic small vessel ischemic disease. 4. Chronic right thalamic lacunar infarct 5. Mild generalized parenchymal atrophy. CTA neck: 1. The bilateral common carotid, internal carotid and vertebral arteries are patent within the neck without stenosis. No significant  atherosclerotic disease. 2. Cervical spondylosis and levels of fusion, as described. CTA head: 1. The left posterior cerebral artery is patent proximally. However, portions of the left posterior cerebral artery are poorly delineated beyond the P2/P3 junction and this portion of the vessel may be occluded. 2. Elsewhere, no intracranial large vessel occlusion or proximal high-grade arterial stenosis is identified. 3. Calcified plaque within the intracranial internal carotid arteries bilaterally with no more than mild stenosis. Electronically Signed   By: Jackey Loge DO   On: 11/02/2020 18:05     MR BRAIN WO CONTRAST Result Date: 11/03/2020 CLINICAL DATA:  Stroke, follow-up EXAM: MRI HEAD WITHOUT CONTRAST TECHNIQUE: Multiplanar, multiecho pulse sequences of the brain and surrounding structures were obtained without intravenous contrast. COMPARISON:  Prior CT imaging FINDINGS: Brain: Mildly reduced diffusion in the parasagittal left occipital lobe with corresponding susceptibility. Patchy and confluent areas of T2 hyperintensity in the supratentorial and pontine white matter are nonspecific but may reflect moderate chronic microvascular ischemic changes. There are chronic small vessel infarcts of the pons, thalamus, and left cerebellum. There is no significant mass effect. No hydrocephalus or extra-axial collection. Vascular: Major vessel flow voids at the skull base are preserved. Skull and upper cervical spine: Normal marrow signal is preserved. Sinuses/Orbits: Paranasal sinuses are aerated. Orbits are unremarkable. Other: Sella is unremarkable.  Mastoid air cells are clear. IMPRESSION: Acute to subacute left occipital infarction with hemorrhagic conversion. No mass effect. Chronic microvascular ischemic changes and chronic small-vessel infarcts. Electronically Signed   By: Guadlupe Spanish M.D.   On: 11/03/2020 11:45     12-lead ECG SR All prior EKG's in EPIC reviewed with no documented atrial  fibrillation  Telemetry SR  Assessment and Plan:  1. Cryptogenic stroke  The patient presents with cryptogenic stroke.  The patient has a TEE planned for this today.  Dr. Elberta Fortis spoke at length with the patient (and her sister at bedside) about monitoring for afib with either a 30 day event monitor or an implantable loop recorder.  Risks, benefits, and alteratives to implantable loop recorder were discussed with the patient today.   At this time, the patient is very clear in their decision to proceed with implantable loop recorder, pending her TEE findings   Wound care was reviewed with the patient (keep incision clean and dry for 3 days).  Wound check follow up Johanne Mcglade be scheduled for the patient.  Please call with questions.   Sheilah Pigeon, PA-C 11/04/2020  I have seen and examined this patient with Francis Dowse.  Agree with above, note added to reflect my findings.  On exam, RRR, no murmurs..  Patient presented to the hospital with cryptogenic stroke. To date, no cause has been found. TEE planned for today. If unrevealing, Kandy Towery plan for LINQ monitor to look for atrial fibrillation. Risks and benefits discussed. Risks include but not limited to bleeding and infection. The patient understands the risks and has agreed to  the procedure.  Garlin Batdorf M. Arun Herrod MD 11/05/2020 9:21 AM

## 2020-11-04 NOTE — TOC Initial Note (Signed)
Transition of Care [facility]) - Initial/Assessment Note    Patient Details  Name: [name] MRN: [id number] Date of Birth: Feb 06, 1961  Transition of Care [facility]) CM/SW Contact:    Joanne Chars, LCSW Phone Number: 11/04/2020, 2:48 PM  Clinical Narrative: CSW met with pt, friend Pamala Hurry and sister Katharine Look.  Permission given to speak with friend and sister present.  Pt agreeable to [facility] recommendation and requesting referral to [facility].  Choice document given. Pt lives alone, sister and friend will be available as needed to be with her at discharge.  Pt is vaccinated for covid and boosted.  No equipment currently in home, does want rolling walker.  PCP in place.                      Expected Discharge Plan: [place] Barriers to Discharge: Continued Medical Work up   Patient Goals and CMS Choice Patient states their goals for this hospitalization and ongoing recovery are:: "get my eating right" CMS Medicare.gov Compare Post Acute Care list provided to:: Patient Choice offered to / list presented to : Patient  Expected Discharge Plan and Services Expected Discharge Plan: [place] In-house Referral: Clinical Social Work   Post Acute Care Choice: [place] arrangements for the past 2 months: Manuel Garcia                 DME Arranged: Walker rolling DME Agency: [employer] Date DME Agency Contacted: 11/04/20 Time DME Agency Contacted: [phone] Representative spoke with at DME Agency: Freda Munro [place]: PT,OT [place] Agency: [facility] (formerly [employer]) Date [place]: 11/04/20 Time [place]: [place] Representative spoke with at [place]: [place]  Prior Living Arrangements/Services Living arrangements for the past 2 months: [place] with:: Self Patient language and need for interpreter reviewed:: Yes Do you feel safe going back to the place where you live?: Yes       Need for Family Participation in Patient Care: Yes (Comment) Care giver support system in place?: Yes (comment) Current home services: Other (comment) (none) Criminal Activity/Legal Involvement Pertinent to Current Situation/Hospitalization: No - Comment as needed  Activities of Daily Living Home Assistive Devices/Equipment: Cane (specify quad or straight) ADL Screening (condition at time of admission) Patient's cognitive ability adequate to safely complete daily activities?: Yes Is the patient deaf or have difficulty hearing?: No Does the patient have difficulty seeing, even when wearing glasses/contacts?: Yes Does the patient have difficulty concentrating, remembering, or making decisions?: No Patient able to express need for assistance with ADLs?: No Does the patient have difficulty dressing or bathing?: No Independently performs ADLs?: Yes (appropriate for developmental age) Does the patient have difficulty walking or climbing stairs?: No Weakness of Legs: Both Weakness of Arms/Hands: None  Permission Sought/Granted Permission sought to share information with : Family Supports Permission granted to share information with : Yes, Verbal Permission Granted  Share Information with NAME: sister Katharine Look, friend Pamala Hurry           Emotional Assessment Appearance:: Appears stated age Attitude/Demeanor/Rapport: Engaged Affect (typically observed): Pleasant,Appropriate Orientation: : Oriented to Self,Oriented to Place,Oriented to  Time,Oriented to Situation Alcohol / Substance Use: Not Applicable Psych Involvement: No (comment)  Admission diagnosis:  Aphasia [R47.01] Stroke ([place]) [I63.9] Abnormal peripheral vision, right [H53.451] Cerebrovascular accident (CVA), unspecified mechanism ([place]) [I63.9] Patient Active Problem List   Diagnosis Date Noted  . Aphasia   . Stroke [facility])  11/02/2020  . S/P right TKA 01/23/2012  . Chest pain 07/12/2011  . Palpitations 07/12/2011  .  Hyperlipidemia 02/10/2009  . CAD, NATIVE VESSEL 02/10/2009  . CARDIOMYOPATHY, ISCHEMIC 02/10/2009  . ARTHRITIS 02/10/2009  . Type 2 diabetes mellitus with hyperlipidemia ([place]) 04/23/2007  . Essential hypertension 04/23/2007   PCP:  Willey Blade, MD Pharmacy:   [id number] [id number]: [phone]: [phone]    Social Determinants of Health (SDOH) Interventions    Readmission Risk Interventions No flowsheet data found.

## 2020-11-04 NOTE — Progress Notes (Signed)
    [facility] has been requested to perform a transesophageal echocardiogram on [name] for stroke.  After careful review of history and examination, the risks and benefits of transesophageal echocardiogram have been explained including risks of esophageal damage, perforation (1:10,000 risk), bleeding, pharyngeal hematoma as well as other potential complications associated with conscious sedation including aspiration, arrhythmia, respiratory failure and death. Alternatives to treatment were discussed, questions were answered. Patient is willing to proceed.   Pt is scheduled for TEE 11/05/20 at 0830 with Dr. Tenny Craw. NPO at [place] please.  Marcelino Duster, [place]  11/04/2020 3:39 PM

## 2020-11-04 NOTE — Evaluation (Signed)
Physical Therapy Evaluation Patient Details Name: [name] MRN: [id number] DOB: [birth year] Today's Date: 11/04/2020   History of Present Illness  [name] is a 60 y.o. female with a history of hypertension, hyperlipidemia, diabetes who presents with visual change and trouble speaking. 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  Clinical Impression   Pt admitted with above diagnosis. Patient was independent with occasional use of cane when her back or knee were painful. (Sister reports she was mildly unsteady; pt denies falls). Currently pt requires min assist for ambulation due to staggering losses of balance. She is reluctant to use a rolling walker, but ultimately agreed to use it in the home. She can benefit from further education in use of RW.  Pt currently with functional limitations due to the deficits listed below (see PT Problem List). Pt will benefit from skilled PT to increase their independence and safety with mobility to allow discharge to the venue listed below.  Sister is trying to arrange to stay with pt (or have pt stay with her) upon discharge.      Follow Up Recommendations Home health PT;Supervision for mobility/OOB    Equipment Recommendations  Rolling walker with 5" wheels    Recommendations for Other Services       Precautions / Restrictions Precautions Precautions: Fall;Other (comment) Precaution Comments: R peripheral vision loss      Mobility  Bed Mobility                    Transfers Overall transfer level: Needs assistance Equipment used: Rolling walker (2 wheeled);Straight cane;None Transfers: Sit to/from [employer] to Stand: Min guard         General transfer comment: cues for sequence with cane vs RW  Ambulation/Gait Ambulation/Gait assistance: Min assist Gait Distance (Feet): 100 Feet (seated rest; 60 ft) Assistive device: Straight cane;None Gait Pattern/deviations: Step-to  pattern;Step-through pattern;Decreased stride length;Staggering left Gait velocity: significantly decreased with use of cane   General Gait Details: overall, pt did not run into anything on her right side; required cues to more fully scan to the right when looking for red objects on her right as she walked past 2 without seeing them; no device, she was more unsteady during turns, although velocity was better than when walking with cane (step-to pattern) and still with staggering LOB during turns; educated on use of RW and pt reluctant to utilize ("I'm vain") but ultimately agreed to use it at least inside the home. Educated on advantages of RW vs cane in regaining her normal gait more quickly.  Stairs            Wheelchair Mobility    Modified Rankin (Stroke Patients Only) Modified Rankin (Stroke Patients Only) Pre-Morbid Rankin Score: No significant disability Modified Rankin: Moderately severe disability     Balance Overall balance assessment: Needs assistance Sitting-balance support: Bilateral upper extremity supported;Feet supported Sitting balance-Leahy Scale: Good     Standing balance support: No upper extremity supported Standing balance-Leahy Scale: Fair             Rhomberg - Eyes Closed: 10 (cannot get feet all the way together due to body habitus; no incr sway)                 Pertinent Vitals/Pain Pain Assessment: No/denies pain    Home Living Family/patient expects to be discharged to:: Private residence Living Arrangements: Alone Available Help at Discharge: Family Type of Home: House Home Access:  Stairs to enter Entrance Stairs-Rails: Right Entrance Stairs-Number of Steps: 2+1 Home Layout: One level   Additional Comments: plans to go home with sister or have sister assist    Prior Function Level of Independence: Independent               Hand Dominance   Dominant Hand: Right    Extremity/Trunk Assessment   Upper Extremity  Assessment Upper Extremity Assessment: Generalized weakness    Lower Extremity Assessment Lower Extremity Assessment: Generalized weakness    Cervical / Trunk Assessment Cervical / Trunk Assessment: Other exceptions Cervical / Trunk Exceptions: overweight  Communication   Communication: No difficulties  Cognition Arousal/Alertness: Awake/alert Behavior During Therapy: WFL for tasks assessed/performed Overall Cognitive Status: Difficult to assess                                 General Comments: able to state her visual deficits, did not note her changes in speech or balance as current issues      General Comments General comments (skin integrity, edema, etc.): sister present and supportive when discussing advantages of RW over cane. Educated on getting a larger rubber tip for her cane (or perhaps a new cane that has the offset handle), either square or possibly triangle shaped tip but NOT a quad cane. She has a family member that is an OT and offered to help her with upgrading her cane.    Exercises     Assessment/Plan    PT Assessment Patient needs continued PT services  PT Problem List Decreased strength;Decreased balance;Decreased mobility;Decreased knowledge of use of DME;Obesity       PT Treatment Interventions DME instruction;Gait training;Stair training;Functional mobility training;Therapeutic activities;Balance training;Patient/family education    PT Goals (Current goals can be found in the Care Plan section)  Acute Rehab PT Goals Patient Stated Goal: to resume walking without a device PT Goal Formulation: With patient Time For Goal Achievement: 11/18/20 Potential to Achieve Goals: Good    Frequency Min 3X/week   Barriers to discharge Decreased caregiver support may stay with her sister in [place]    Co-evaluation               AM-PAC PT "6 Clicks" Mobility  Outcome Measure Help needed turning from your back to your side while in a  flat bed without using bedrails?: None Help needed moving from lying on your back to sitting on the side of a flat bed without using bedrails?: A Little Help needed moving to and from a bed to a chair (including a wheelchair)?: A Little Help needed standing up from a chair using your arms (e.g., wheelchair or bedside chair)?: A Little Help needed to walk in hospital room?: A Little Help needed climbing 3-5 steps with a railing? : A Little 6 Click Score: 19    End of Session Equipment Utilized During Treatment: Gait belt Activity Tolerance: Patient tolerated treatment well Patient left: in chair;with call bell/phone within reach;with family/visitor present   PT Visit Diagnosis: Unsteadiness on feet (R26.81)    Time: 2025-4270 PT Time Calculation (min) (ACUTE ONLY): 43 min   Charges:   PT Evaluation $PT Eval Moderate Complexity: 1 Mod PT Treatments $Gait Training: 23-37 mins         Jerolyn Center, PT Pager [phone]   Zena Amos 11/04/2020, 10:38 AM

## 2020-11-04 NOTE — TOC Benefit Eligibility Note (Signed)
Transition of Care [facility]) Benefit Eligibility Note    Patient Details  Name: [name] MRN: [id number] Date of Birth: 29-Sep-1960   Medication/Dose: Marden Noble    \    Prescription Coverage [facility]  Spoke with Person/Company/Phone Number:: [facility] on [address]  Co-Pay: $24.99  Prior Approval: No       Orson Aloe Phone Number: 11/04/2020, 2:28 PM

## 2020-11-04 NOTE — Progress Notes (Signed)
PROGRESS NOTE  [name] YQM:[id number] DOB: 09-Jun-1961 DOA: 11/02/2020 PCP: [name], MD  HPI/Recap of past 24 hours: HPI from Dr Dorita Sciara [name] is a 60 y.o. female with medical history significant for CAD s/p stent , HTN, HLD and Type 2 DM who presents as a transfer from [facility] with 4 days of right peripheral vision loss and new onset aphasia. Patient had difficulty recalling history given aphasia and difficulty with word finding.  History mostly obtained from documentation that was given by her sister who is not at bedside at this time. Patient is right-handed and was seen for her decreased right peripheral vision vision on 3/25 in the ED but due to extreme claustrophobia to MRI she declined further work-up and wanted follow-up with her ophthalmologist. She presented back to the ED because of the addition of new onset aphasia and difficulty word finding. In the ED, was afebrile and hypertensive up to 160s systolic. CTA head and neck showed acute/early subactue left temporal occipital PCA territory infarct with mild petechial hemorrhage. Parts of left posterior cerebral artery may also be occluded.  Neurology consulted.  Patient admitted for further management.     Today, patient denies any new complaints, aphasia seems to be improving, still with peripheral vision loss on the right, denies any chest pain, abdominal pain, shortness of breath, palpitations, nausea/vomiting, fever/chills.    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 MRI showed above with hemorrhagic conversion, no mass-effect noted CTA head showed no LVO but occluded P2/P3 junction of the left posterior cerebral artery Echo done on 10/22/2020 showed EF of 45 to 50%, grade 1 diastolic dysfunction,  severe akinesis of the left ventricular anterior septal wall, mild mitral valve regurgitation Plan for TEE, loop recorder placement on 11/05/20  LDL 85, A1c 10.8 Neurology/stroke team consulted, appreciate recs PT/OT/SLP Frequent neurochecks, telemetry Continue aspirin, started on Brilinta, discontinued home Plavix  Hypokalemia Replace as needed  Chronic combined systolic and diastolic HF Appears euvolemic Echo with EF of 45 to 50%, grade 1 diastolic dysfunction Hold home Coreg, losartan for now  Hypertension Allow for permissive hypertension for now Hold home Coreg, losartan for now  Diabetes mellitus type 2, uncontrolled A1c 10.8 Continue SSI, glargine, Accu-Cheks, hypoglycemic protocol Hold home regimen for now  CAD s/p stent/hyperlipidemia Chest pain-free Continue aspirin, Zetia, now brillanta    Estimated body mass index is 40.91 kg/m as calculated from the following:   Height as of this encounter: 5\' 4"  (1.626 m).   Weight as of this encounter: 108.1 kg.     Code Status: Full  Family Communication: Discussed with sister at bedside on 11/04/2020  Disposition Plan: Status is: Observation  The patient remains OBS appropriate and will d/c before 2 midnights.  Dispo: The patient is from: Home              Anticipated d/c is to: Home              Patient currently is not medically stable to d/c.   Difficult to place patient No    Consultants:  Neurology  Procedures:  None  Antimicrobials:  None  DVT prophylaxis: Lovenox   Objective: Vitals:   11/03/20 2249 11/04/20 0830 11/04/20 1043 11/04/20 1045  BP: (!) 136/57  (!) 147/85 (!) 142/71  Pulse: 85 84    Resp: 16 17 18 (!) 21  Temp: 98.6 F (37 C)  98.5 F (36.9 C)   TempSrc: Oral  Oral   SpO2: 98%  99%   Weight:      Height:       No intake or output data in the 24 hours ending 11/04/20 1546 Filed Weights   11/02/20 1606 11/02/20 2205  Weight: 113 kg 108.1 kg    Exam:  General:  NAD, alert, awake, oriented, noted aphasia, although slight improvement  Cardiovascular: S1, S2 present  Respiratory: CTAB  Abdomen: Soft, nontender, nondistended, bowel sounds present  Musculoskeletal: No bilateral pedal edema noted  Skin: Normal  Psychiatry: Normal mood  Neurology: Strength 5/5 in all 4 extremities, sensation intact, noted aphasia, comprehension intact, noted right hemianopia   Data Reviewed: CBC: Recent Labs  Lab 10/29/20 2009 11/02/20 1630 11/03/20 0344 11/04/20 0204  WBC 11.4* 11.4* 10.9* 9.1  NEUTROABS  --  7.6 6.6 5.6  HGB 12.1 12.4 12.0 12.0  HCT 37.6 38.7 38.9 38.3  MCV 81.2 81.0 83.3 82.2  PLT 349 343 350 326   Basic Metabolic Panel: Recent Labs  Lab 10/29/20 2009 11/02/20 1630 11/03/20 0344 11/04/20 0204  NA 139 136 139 139  K 3.0* 3.0* 2.9* 3.2*  CL 102 100 104 104  CO2 30 26 27 28  GLUCOSE 288* 258* 109* 198*  BUN 9 10 6* 7*  CREATININE 0.80 0.75 0.59 0.80  CALCIUM 9.0 8.7* 8.9 8.8*  MG  --   --  1.7  --    GFR: Estimated Creatinine Clearance: 87.6 mL/min (by C-G formula based on SCr of 0.8 mg/dL). Liver Function Tests: Recent Labs  Lab 11/02/20 1630  AST 17  ALT 12  ALKPHOS 68  BILITOT 0.4  PROT 7.0  ALBUMIN 3.1*   No results for input(s): LIPASE, AMYLASE in the last 168 hours. No results for input(s): AMMONIA in the last 168 hours. Coagulation Profile: Recent Labs  Lab 11/02/20 1630  INR 1.0   Cardiac Enzymes: No results for input(s): CKTOTAL, CKMB, CKMBINDEX, TROPONINI in the last 168 hours. BNP (last 3 results) No results for input(s): PROBNP in the last 8760 hours. HbA1C: Recent Labs    11/03/20 0344  HGBA1C 10.8*   CBG: Recent Labs  Lab 11/03/20 1226 11/03/20 1657 11/03/20 2010 11/04/20 0718 11/04/20 1122  GLUCAP 153* 247* 240* 219* 252*   Lipid Profile: Recent Labs    11/03/20 0344  CHOL 144  HDL 39*  LDLCALC 85  TRIG 100  CHOLHDL 3.7   Thyroid Function Tests: Recent Labs     11/02/20 1630  TSH 3.097   Anemia Panel: No results for input(s): VITAMINB12, FOLATE, FERRITIN, TIBC, IRON, RETICCTPCT in the last 72 hours. Urine analysis:    Component Value Date/Time   COLORURINE YELLOW 11/02/2020 1630   APPEARANCEUR CLOUDY (A) 11/02/2020 1630   LABSPEC 1.015 11/02/2020 1630   PHURINE 6.0 11/02/2020 1630   GLUCOSEU 250 (A) 11/02/2020 1630   HGBUR NEGATIVE 11/02/2020 1630   BILIRUBINUR NEGATIVE 11/02/2020 1630   KETONESUR NEGATIVE 11/02/2020 1630   PROTEINUR NEGATIVE 11/02/2020 1630   UROBILINOGEN 0.2 01/18/2012 0932   NITRITE NEGATIVE 11/02/2020 1630   LEUKOCYTESUR SMALL (A) 11/02/2020 1630   Sepsis Labs: @LABRCNTIP(procalcitonin:4,lacticidven:4)  ) Recent Results (from the past 240 hour(s))  Urine culture     Status: Abnormal   Collection Time: 11/02/20  4:30 PM   Specimen: Urine, Random  Result Value Ref Range   Status   Specimen Description   Final    URINE, RANDOM Performed at [facility], [address] [place], [place] [address]    Special Requests   Final    NONE Performed at [facility], [address] [place], [place] [address]    Culture MULTIPLE SPECIES PRESENT, SUGGEST RECOLLECTION (A)  Final   Report Status 11/04/2020 FINAL  Final  Resp Panel by RT-PCR (Flu A&B, Covid) Nasopharyngeal Swab     Status: None   Collection Time: 11/02/20  7:22 PM   Specimen: Nasopharyngeal Swab; Nasopharyngeal(NP) swabs in vial transport medium  Result Value Ref Range Status   SARS Coronavirus 2 by RT PCR NEGATIVE NEGATIVE Final    Comment: (NOTE) SARS-CoV-2 target nucleic acids are NOT DETECTED.  The SARS-CoV-2 RNA is generally detectable in upper respiratory specimens during the acute phase of infection. The lowest concentration of SARS-CoV-2 viral copies this assay can detect is 138 copies/mL. A negative result does not preclude SARS-Cov-2 infection and should not be used as the sole basis for treatment or other patient  management decisions. A negative result may occur with  improper specimen collection/handling, submission of specimen other than nasopharyngeal swab, presence of viral mutation(s) within the areas targeted by this assay, and inadequate number of viral copies(<138 copies/mL). A negative result must be combined with clinical observations, patient history, and epidemiological information. The expected result is Negative.  Fact Sheet for Patients:  BloggerCourse.com  Fact Sheet for Healthcare Providers:  SeriousBroker.it  This test is no t yet approved or cleared by the [place] FDA and  has been authorized for detection and/or diagnosis of SARS-CoV-2 by FDA under an Emergency Use Authorization (EUA). This EUA will remain  in effect (meaning this test can be used) for the duration of the COVID-19 declaration under Section 564(b)(1) of the Act, 21 U.S.C.section 360bbb-3(b)(1), unless the authorization is terminated  or revoked sooner.       Influenza A by PCR NEGATIVE NEGATIVE Final   Influenza B by PCR NEGATIVE NEGATIVE Final    Comment: (NOTE) The Xpert Xpress SARS-CoV-2/FLU/RSV plus assay is intended as an aid in the diagnosis of influenza from Nasopharyngeal swab specimens and should not be used as a sole basis for treatment. Nasal washings and aspirates are unacceptable for Xpert Xpress SARS-CoV-2/FLU/RSV testing.  Fact Sheet for Patients: BloggerCourse.com  Fact Sheet for Healthcare Providers: SeriousBroker.it  This test is not yet approved or cleared by the [place] FDA and has been authorized for detection and/or diagnosis of SARS-CoV-2 by FDA under an Emergency Use Authorization (EUA). This EUA will remain in effect (meaning this test can be used) for the duration of the COVID-19 declaration under Section 564(b)(1) of the Act, 21 U.S.C. section 360bbb-3(b)(1),  unless the authorization is terminated or revoked.  Performed at [facility], [address] [place], [place] [address]       Studies: No results found.  Scheduled Meds: . aspirin EC  81 mg Oral Daily  . enoxaparin (LOVENOX) injection  40 mg Subcutaneous Daily  . ezetimibe  10 mg Oral Daily  . insulin aspart  0-15 Units Subcutaneous TID WC  . insulin aspart  0-5 Units Subcutaneous QHS  . insulin aspart  3 Units Subcutaneous TID WC  . insulin glargine  30 Units Subcutaneous QHS  . ticagrelor  90 mg Oral BID    Continuous Infusions:   LOS: 0 days     Warrick Parisian  Rayann Heman, MD [facility]  If 7PM-7AM, please contact night-coverage [email or web address] 11/04/2020, 3:46 PM

## 2020-11-04 NOTE — Progress Notes (Signed)
STROKE TEAM PROGRESS NOTE  Brief HPI: Reports vision difficulty since 3/15 which impaired her driving and walking (running into objects). She also noticed difficulty with speech/word finding as well.  Does recall heart racing at times but denies syncopal episodes. Presented to ED 3/29. MRI confirmed acute to subacute left occipital infarct with trace hemorrhagic conversion.  Chronic changes of small vessel disease.  INTERVAL HISTORY No acute events overnight  Awaiting TEE and possible Loop placement tomorrow.   We reiterated her stroke diagnosis and discussed ongoing work up and plan of care with patient and sister at bedside. Questions answered.   Vitals:   11/03/20 0500 11/03/20 0600 11/03/20 1652 11/03/20 2249  BP:   (!) 103/59 (!) 136/57  Pulse:   86 85  Resp: 19 17 15 16   Temp:   97.8 F (36.6 C) 98.6 F (37 C)  TempSrc:    Oral  SpO2:   99% 98%  Weight:      Height:       CBC:  Recent Labs  Lab 11/03/20 0344 11/04/20 0204  WBC 10.9* 9.1  NEUTROABS 6.6 5.6  HGB 12.0 12.0  HCT 38.9 38.3  MCV 83.3 82.2  PLT 350 326   Basic Metabolic Panel:  Recent Labs  Lab 11/03/20 0344 11/04/20 0204  NA 139 139  K 2.9* 3.2*  CL 104 104  CO2 27 28  GLUCOSE 109* 198*  BUN 6* 7*  CREATININE 0.59 0.80  CALCIUM 8.9 8.8*  MG 1.7  --    Lipid Panel:  Recent Labs  Lab 11/03/20 0344  CHOL 144  TRIG 100  HDL 39*  CHOLHDL 3.7  VLDL 20  LDLCALC 85   HgbA1c:  Recent Labs  Lab 11/03/20 0344  HGBA1C 10.8*   Urine Drug Screen:  Recent Labs  Lab 11/03/20 1305  LABOPIA NONE DETECTED  COCAINSCRNUR NONE DETECTED  LABBENZ NONE DETECTED  AMPHETMU NONE DETECTED  THCU NONE DETECTED  LABBARB NONE DETECTED    Alcohol Level No results for input(s): ETH in the last 168 hours.  IMAGING past 24 hours MR BRAIN WO CONTRAST  Result Date: 11/03/2020 CLINICAL DATA:  Stroke, follow-up EXAM: MRI HEAD WITHOUT CONTRAST TECHNIQUE: Multiplanar, multiecho pulse sequences of the brain  and surrounding structures were obtained without intravenous contrast. COMPARISON:  Prior CT imaging FINDINGS: Brain: Mildly reduced diffusion in the parasagittal left occipital lobe with corresponding susceptibility. Patchy and confluent areas of T2 hyperintensity in the supratentorial and pontine white matter are nonspecific but may reflect moderate chronic microvascular ischemic changes. There are chronic small vessel infarcts of the pons, thalamus, and left cerebellum. There is no significant mass effect. No hydrocephalus or extra-axial collection. Vascular: Major vessel flow voids at the skull base are preserved. Skull and upper cervical spine: Normal marrow signal is preserved. Sinuses/Orbits: Paranasal sinuses are aerated. Orbits are unremarkable. Other: Sella is unremarkable.  Mastoid air cells are clear. IMPRESSION: Acute to subacute left occipital infarction with hemorrhagic conversion. No mass effect. Chronic microvascular ischemic changes and chronic small-vessel infarcts. Electronically Signed   By: 11/05/2020 M.D.   On: 11/03/2020 11:45    PHYSICAL EXAM  Constitutional: Appears well-developed and well-nourished.  Psych: Affect appropriate to situation  Eyes: No scleral injection  HENT: No OP obstruction  MSK: no joint deformities.  Cardiovascular: Normal rate and regular rhythm.  Respiratory: Effort normal, non-labored breathing  GI: Soft. No distension. There is no tenderness.  Skin: WDI  Neuro:  Mental Status:  Patient is awake,  alert, oriented to person, place, month, year.  Aphasia, expressive, with word finding difficulty and substitutions.  Cranial Nerves:  II: She has a dense right hemianopia pupils are equal, round, and reactive to light.  III,IV, VI: EOMI without ptosis or diploplia.  V: Facial sensation is symmetric to temperature  VII: Facial movement is symmetric.  VIII: hearing is intact to voice  X: Uvula elevates symmetrically  XI: Shoulder shrug is  symmetric.  XII: tongue is midline without atrophy or fasciculations.  Motor:  Tone is normal. Bulk is normal. 5/5 strength was present in all four extremities.  Sensory:  Sensation is symmetric to light touch and temperature in the arms and legs.  Cerebellar:  FNF and HKS are intact bilaterally   ASSESSMENT/PLAN [name] is a 60 y.o. female with a history of HTN,, HLD, DM2, Morbid obesity, CAD: Taxus drug eluting stent placed in the LAD in 2007 , Acute anterior STEMI June 2010 with very late thrombosis of the Taxus stent in the proximal LAD s/p balloon angioplasty, ischemic cardiomyopathy, Statin intolerance (declined PCSK9), who presents with visual change that is been going on since Friday 3/25.  She also has noticed trouble with speaking, but has not noticed much trouble with reading.   Due to not improving, she sought care and med [facility]  on 3/29. There a CT/CTA was performed which demonstrates a sizable PCA infarct on the left with some petechial hemorrhage 60 year old female with PCA infarct. Given the degree of her petechial hemorrhage, monotherapy with aspirin for now was implemented.   Stroke: Left PCA stroke with occipital infarction and hemorrhagic conversion concerning for embolic source.    Code Stroke PCA infarct with petechial hemorrhage  CTA head  The left posterior cerebral artery is patent proximally. However, portions of the left posterior cerebral artery are poorly delineated beyond the P2/P3 junction and this portion of the vessel may be occluded. Elsewhere, no intracranial large vessel occlusion or proximal high-grade arterial stenosis is identified.Calcified plaque within the intracranial internal carotid arteries bilaterally with no more than mild stenosis.  CTA neck  The bilateral common carotid, internal carotid and vertebral arteries are patent within the neck without stenosis. No significant atherosclerotic disease. Cervical spondylosis and  levels of fusion, as described.  MRI  Acute to subacute left occipital infarction with hemorrhagic conversion. No mass effect.  2D Echo Performed 10/22/20 EF 45-50%, +regional wall abnormalities, There is severe akinesis of the left ventricular, mid-apical apical segment and anteroseptal wall. Findings suggest mid to distal LAD infarct/scar. Grade I diastolic dysfunction. Mild mitral valve regurgitation. No thrombus or shunt.     LDL 85  HgbA1c 10.8  VTE prophylaxis - Lovenox ppx    Diet   Diet heart healthy/carb modified Room service appropriate? Yes; Fluid consistency: Thin    On ASA 81mg  and Plavix 75 mg daily prior to admission  Preventive therapy: ASA 81mg  and Brilinta x 30 days then Brilinta alone if this is feasible financially otherwise she would need to return to previous ASA 81 and Plavix regimen.   TEE planned for tomorrow am. Loop placement consideration requested as well.   Therapy recommendations:  Cleared for home with HHPT, OT  Disposition:  Home   Hypertension . Permissive hypertension (OK if < 220/120) but gradually normalize in 5-7 days . Long-term BP goal normotensive  Hyperlipidemia  Home meds: Statin intolerant, has declined PCSK9.   LDL 85, not quite at goal < 70  Add Zetia 10mg  daily  High intensity statin is not recommended as cardiologist has documented statin intolerance.   Continue statin at discharge  Diabetes type II Uncontrolled  HgbA1c 10.8, goal < 7.0  CBGs Recent Labs    11/03/20 1226 11/03/20 1657 11/03/20 2010  GLUCAP 153* 247* 240*      Management per primary team  Right visual field deficit  No driving until further notice  PT/OT  Other Stroke Risk Factors  Former Cigarette and smokeless tobacco user   Obesity, Body mass index is 40.91 kg/m., BMI >/= 30 associated with increased stroke risk, recommend weight loss, diet and exercise as appropriate   Coronary artery disease  Other Active Problems      Hospital day # 0 I have personally obtained history,examined this patient, reviewed notes, independently viewed imaging studies, participated in medical decision making and plan of care.ROS completed by me personally and pertinent positives fully documented  I have made any additions or clarifications directly to the above note. Agree with note above.  Patient continues to have significant right-sided peripheral vision loss.  Await TEE and loop recorder tomorrow.  Patient also appears to be at risk for sleep apnea.  Discussed possible participation in the sleep smart study and patient appears interested.  Will give her information to review and decide.  Long discussion patient and sister at the bedside and answered questions.  Greater than 50% time during this 25-minute visit was spent on counseling and coordination of care about her cryptogenic stroke and suspected sleep apnea answering questions Delia Heady, MD Medical Director [facility] Pager: [phone] 11/04/2020 2:34 PM    To contact Stroke Continuity provider, please refer to [email or web address]. After hours, contact General Neurology

## 2020-11-05 ENCOUNTER — Inpatient Hospital Stay (HOSPITAL_COMMUNITY): Payer: Medicare HMO

## 2020-11-05 ENCOUNTER — Inpatient Hospital Stay (HOSPITAL_COMMUNITY): Payer: Medicare HMO | Admitting: Anesthesiology

## 2020-11-05 ENCOUNTER — Encounter (HOSPITAL_COMMUNITY): Payer: Self-pay | Admitting: Family Medicine

## 2020-11-05 ENCOUNTER — Encounter (HOSPITAL_COMMUNITY)
Admission: EM | Disposition: A | Discharge status: Home Health | Payer: Self-pay | Source: Home / Self Care | Attending: Internal Medicine

## 2020-11-05 ENCOUNTER — Encounter (HOSPITAL_COMMUNITY): Payer: Medicare HMO

## 2020-11-05 DIAGNOSIS — Z96651 Presence of right artificial knee joint: Secondary | ICD-10-CM | POA: Diagnosis present

## 2020-11-05 DIAGNOSIS — Z9049 Acquired absence of other specified parts of digestive tract: Secondary | ICD-10-CM | POA: Diagnosis not present

## 2020-11-05 DIAGNOSIS — D72829 Elevated white blood cell count, unspecified: Secondary | ICD-10-CM | POA: Diagnosis present

## 2020-11-05 DIAGNOSIS — H5461 Unqualified visual loss, right eye, normal vision left eye: Secondary | ICD-10-CM | POA: Diagnosis present

## 2020-11-05 DIAGNOSIS — I110 Hypertensive heart disease with heart failure: Secondary | ICD-10-CM | POA: Diagnosis present

## 2020-11-05 DIAGNOSIS — E782 Mixed hyperlipidemia: Secondary | ICD-10-CM

## 2020-11-05 DIAGNOSIS — I639 Cerebral infarction, unspecified: Secondary | ICD-10-CM

## 2020-11-05 DIAGNOSIS — E1165 Type 2 diabetes mellitus with hyperglycemia: Secondary | ICD-10-CM | POA: Diagnosis present

## 2020-11-05 DIAGNOSIS — I2510 Atherosclerotic heart disease of native coronary artery without angina pectoris: Secondary | ICD-10-CM | POA: Diagnosis present

## 2020-11-05 DIAGNOSIS — E785 Hyperlipidemia, unspecified: Secondary | ICD-10-CM | POA: Diagnosis present

## 2020-11-05 DIAGNOSIS — I340 Nonrheumatic mitral (valve) insufficiency: Secondary | ICD-10-CM

## 2020-11-05 DIAGNOSIS — E876 Hypokalemia: Secondary | ICD-10-CM | POA: Diagnosis present

## 2020-11-05 DIAGNOSIS — I255 Ischemic cardiomyopathy: Secondary | ICD-10-CM | POA: Diagnosis present

## 2020-11-05 DIAGNOSIS — I10 Essential (primary) hypertension: Secondary | ICD-10-CM

## 2020-11-05 DIAGNOSIS — Z833 Family history of diabetes mellitus: Secondary | ICD-10-CM | POA: Diagnosis not present

## 2020-11-05 DIAGNOSIS — Z8249 Family history of ischemic heart disease and other diseases of the circulatory system: Secondary | ICD-10-CM | POA: Diagnosis not present

## 2020-11-05 DIAGNOSIS — I63532 Cerebral infarction due to unspecified occlusion or stenosis of left posterior cerebral artery: Secondary | ICD-10-CM | POA: Diagnosis present

## 2020-11-05 DIAGNOSIS — I5042 Chronic combined systolic (congestive) and diastolic (congestive) heart failure: Secondary | ICD-10-CM | POA: Diagnosis present

## 2020-11-05 DIAGNOSIS — I252 Old myocardial infarction: Secondary | ICD-10-CM | POA: Diagnosis not present

## 2020-11-05 DIAGNOSIS — Q211 Atrial septal defect: Secondary | ICD-10-CM | POA: Diagnosis not present

## 2020-11-05 DIAGNOSIS — Z6841 Body Mass Index (BMI) 40.0 and over, adult: Secondary | ICD-10-CM | POA: Diagnosis not present

## 2020-11-05 DIAGNOSIS — E1169 Type 2 diabetes mellitus with other specified complication: Secondary | ICD-10-CM

## 2020-11-05 DIAGNOSIS — Z955 Presence of coronary angioplasty implant and graft: Secondary | ICD-10-CM | POA: Diagnosis not present

## 2020-11-05 DIAGNOSIS — I6300 Cerebral infarction due to thrombosis of unspecified precerebral artery: Secondary | ICD-10-CM | POA: Diagnosis not present

## 2020-11-05 DIAGNOSIS — R4701 Aphasia: Secondary | ICD-10-CM | POA: Diagnosis present

## 2020-11-05 DIAGNOSIS — Z20822 Contact with and (suspected) exposure to covid-19: Secondary | ICD-10-CM | POA: Diagnosis present

## 2020-11-05 DIAGNOSIS — Z87891 Personal history of nicotine dependence: Secondary | ICD-10-CM | POA: Diagnosis not present

## 2020-11-05 DIAGNOSIS — I619 Nontraumatic intracerebral hemorrhage, unspecified: Secondary | ICD-10-CM | POA: Diagnosis present

## 2020-11-05 HISTORY — PX: LOOP RECORDER INSERTION: EP1214

## 2020-11-05 HISTORY — PX: BUBBLE STUDY: SHX6837

## 2020-11-05 HISTORY — PX: TEE WITHOUT CARDIOVERSION: SHX5443

## 2020-11-05 LAB — CBC WITH DIFFERENTIAL/PLATELET
Abs Immature Granulocytes: 0.02 10*3/uL (ref 0.00–0.07)
Basophils Absolute: 0 10*3/uL (ref 0.0–0.1)
Basophils Relative: 0 %
Eosinophils Absolute: 0.1 10*3/uL (ref 0.0–0.5)
Eosinophils Relative: 1 %
HCT: 38.8 % (ref 36.0–46.0)
Hemoglobin: 12.1 g/dL (ref 12.0–15.0)
Immature Granulocytes: 0 %
Lymphocytes Relative: 31 %
Lymphs Abs: 2.8 10*3/uL (ref 0.7–4.0)
MCH: 26.2 pg (ref 26.0–34.0)
MCHC: 31.2 g/dL (ref 30.0–36.0)
MCV: 84.2 fL (ref 80.0–100.0)
Monocytes Absolute: 0.6 10*3/uL (ref 0.1–1.0)
Monocytes Relative: 6 %
Neutro Abs: 5.5 10*3/uL (ref 1.7–7.7)
Neutrophils Relative %: 62 %
Platelets: 352 10*3/uL (ref 150–400)
RBC: 4.61 MIL/uL (ref 3.87–5.11)
RDW: 14.7 % (ref 11.5–15.5)
WBC: 8.9 10*3/uL (ref 4.0–10.5)
nRBC: 0 % (ref 0.0–0.2)

## 2020-11-05 LAB — GLUCOSE, CAPILLARY
Glucose-Capillary: 272 mg/dL — ABNORMAL HIGH (ref 70–99)
Glucose-Capillary: 278 mg/dL — ABNORMAL HIGH (ref 70–99)
Glucose-Capillary: 299 mg/dL — ABNORMAL HIGH (ref 70–99)

## 2020-11-05 LAB — BASIC METABOLIC PANEL
Anion gap: 8 (ref 5–15)
BUN: 8 mg/dL (ref 8–23)
CO2: 26 mmol/L (ref 22–32)
Calcium: 9 mg/dL (ref 8.9–10.3)
Chloride: 105 mmol/L (ref 98–111)
Creatinine, Ser: 0.9 mg/dL (ref 0.44–1.00)
GFR, Estimated: >60 mL/min (ref >60)
Glucose, Bld: 303 mg/dL — ABNORMAL HIGH (ref 70–99)
Potassium: 3.4 mmol/L — ABNORMAL LOW (ref 3.5–5.1)
Sodium: 139 mmol/L (ref 135–145)

## 2020-11-05 SURGERY — LOOP RECORDER INSERTION

## 2020-11-05 SURGERY — ECHOCARDIOGRAM, TRANSESOPHAGEAL
Anesthesia: Monitor Anesthesia Care

## 2020-11-05 MED ORDER — TICAGRELOR 90 MG PO TABS
90.0000 mg | ORAL_TABLET | Freq: Two times a day (BID) | ORAL | 0 refills | Status: AC
Start: 2020-11-05 — End: 2020-12-05

## 2020-11-05 MED ORDER — INSULIN ASPART 100 UNIT/ML ~~LOC~~ SOLN: 5.0000 [IU] | Freq: Three times a day (TID) | SUBCUTANEOUS | Status: DC

## 2020-11-05 MED ORDER — LIDOCAINE 2% (20 MG/ML) 5 ML SYRINGE
INTRAMUSCULAR | Status: DC | PRN
  Administered 2020-11-05: 60 mg via INTRAVENOUS

## 2020-11-05 MED ORDER — PROPOFOL 10 MG/ML IV BOLUS
INTRAVENOUS | Status: DC | PRN
  Administered 2020-11-05: 25 mg via INTRAVENOUS
  Administered 2020-11-05: 30 mg via INTRAVENOUS
  Administered 2020-11-05 (×2): 25 mg via INTRAVENOUS

## 2020-11-05 MED ORDER — LACTATED RINGERS IV SOLN: INTRAVENOUS | Status: DC

## 2020-11-05 MED ORDER — INSULIN GLARGINE 100 UNIT/ML ~~LOC~~ SOLN
40.0000 [IU] | Freq: Every day | SUBCUTANEOUS | Status: DC
  Filled 2020-11-05: qty 0.4

## 2020-11-05 MED ORDER — EZETIMIBE 10 MG PO TABS: 10.0000 mg | ORAL_TABLET | Freq: Every day | ORAL | 0 refills | Status: DC

## 2020-11-05 MED ORDER — PROPOFOL 500 MG/50ML IV EMUL
INTRAVENOUS | Status: DC | PRN
  Administered 2020-11-05: 75 ug/kg/min via INTRAVENOUS

## 2020-11-05 MED ORDER — POTASSIUM CHLORIDE CRYS ER 20 MEQ PO TBCR
40.0000 meq | EXTENDED_RELEASE_TABLET | Freq: Two times a day (BID) | ORAL | Status: DC
  Administered 2020-11-05: 40 meq via ORAL
  Filled 2020-11-05: qty 2

## 2020-11-05 MED ORDER — LIDOCAINE-EPINEPHRINE 1 %-1:100000 IJ SOLN
INTRAMUSCULAR | Status: DC | PRN
  Administered 2020-11-05: 10 mL

## 2020-11-05 MED ORDER — LIDOCAINE-EPINEPHRINE 1 %-1:100000 IJ SOLN
INTRAMUSCULAR | Status: AC
  Filled 2020-11-05: qty 1

## 2020-11-05 MED ORDER — SODIUM CHLORIDE 0.9 % IV SOLN: INTRAVENOUS | Status: DC

## 2020-11-05 MED ORDER — TICAGRELOR 90 MG PO TABS: 90.0000 mg | ORAL_TABLET | Freq: Two times a day (BID) | ORAL | 0 refills | Status: DC

## 2020-11-05 SURGICAL SUPPLY — 2 items
MONITOR REVEAL LINQ II (Prosthesis & Implant Heart) ×1 IMPLANT
PACK LOOP INSERTION (CUSTOM PROCEDURE TRAY) ×2 IMPLANT

## 2020-11-05 NOTE — Progress Notes (Signed)
PT Cancellation Note  Patient Details Name: Katalina Magri MRN: [id number] DOB: 07/23/1961   Cancelled Treatment:    Reason Eval/Treat Not Completed: Medical issues which prohibited therapy (Pt in TEE this am. Will check back as able.)   [name] 11/05/2020, 9:00 AM  Darrik Richman M,PT Acute Rehab Services [phone] (pager)

## 2020-11-05 NOTE — Progress Notes (Signed)
  Echocardiogram Echocardiogram Transesophageal has been performed.  Janalyn Harder 11/05/2020, 9:19 AM

## 2020-11-05 NOTE — Progress Notes (Signed)
PT Cancellation Note  Patient Details Name: [name] MRN: [id number] DOB: [birth year]   Cancelled Treatment:    Reason Eval/Treat Not Completed: Medical issues which prohibited therapy (Pt refused to work with PT as she said she is tired after tests all am.  She said she is using RW and getting to bathroom without difficulty. Will check back at later date.)   [name] 11/05/2020, 12:40 PM  Sherrye Puga M,PT Acute Rehab Services [phone] (pager)

## 2020-11-05 NOTE — Interval H&P Note (Signed)
History and Physical Interval Note:  11/05/2020 8:20 AM  [name]  has presented today for surgery, with the diagnosis of STROKE.  The various methods of treatment have been discussed with the patient and family. After consideration of risks, benefits and other options for treatment, the patient has consented to  Procedure(s): TRANSESOPHAGEAL ECHOCARDIOGRAM (TEE) (N/A) as a surgical intervention.  The patient's history has been reviewed, patient examined, no change in status, stable for surgery.  I have reviewed the patient's chart and labs.  Questions were answered to the patient's satisfaction.     [name]

## 2020-11-05 NOTE — Progress Notes (Signed)
Verbal preliminary report on venous doppler from [facility] is a negative study for DVT b/l.  Will plan to pursue loop implant  Francis Dowse, PA-C

## 2020-11-05 NOTE — Progress Notes (Signed)
STROKE TEAM PROGRESS NOTE     INTERVAL HISTORY No acute events overnight.  Her sister is at the bedside. TEE shows small PFO but lower extremity venous Dopplers are negative Awaiting  Loop placement today prior to discharge No new complaints.  Neurological exam is unchanged Patient did not want to participate in the sleep smart study yesterday.  Vitals:   11/05/20 0923 11/05/20 0933 11/05/20 0943 11/05/20 1131  BP: 130/67 (!) 118/52 (!) 115/43 138/69  Pulse: 91 81 82 85  Resp: (!) 24 (!) 22 (!) 23 15  Temp:   98.5 F (36.9 C) 98.7 F (37.1 C)  TempSrc:   Oral Oral  SpO2: 97% 98% 99% 100%  Weight:      Height:       CBC:  Recent Labs  Lab 11/04/20 0204 11/05/20 0158  WBC 9.1 8.9  NEUTROABS 5.6 5.5  HGB 12.0 12.1  HCT 38.3 38.8  MCV 82.2 84.2  PLT 326 352   Basic Metabolic Panel:  Recent Labs  Lab 11/03/20 0344 11/04/20 0204 11/05/20 0158  NA 139 139 139  K 2.9* 3.2* 3.4*  CL 104 104 105  CO2 27 28 26   GLUCOSE 109* 198* 303*  BUN 6* 7* 8  CREATININE 0.59 0.80 0.90  CALCIUM 8.9 8.8* 9.0  MG 1.7  --   --    Lipid Panel:  Recent Labs  Lab 11/03/20 0344  CHOL 144  TRIG 100  HDL 39*  CHOLHDL 3.7  VLDL 20  LDLCALC 85   HgbA1c:  Recent Labs  Lab 11/03/20 0344  HGBA1C 10.8*   Urine Drug Screen:  Recent Labs  Lab 11/03/20 1305  LABOPIA NONE DETECTED  COCAINSCRNUR NONE DETECTED  LABBENZ NONE DETECTED  AMPHETMU NONE DETECTED  THCU NONE DETECTED  LABBARB NONE DETECTED    Alcohol Level No results for input(s): ETH in the last 168 hours.  IMAGING past 24 hours EP PPM/ICD IMPLANT  Result Date: 11/05/2020 SURGEON:  Loman BrooklynWill Camnitz, MD   PREPROCEDURE DIAGNOSIS:  Cryptogenic Stroke   POSTPROCEDURE DIAGNOSIS:  Cryptogenic Stroke    PROCEDURES:  1. Implantable loop recorder implantation   INTRODUCTION:  [name] is a 60 y.o. female with a history of unexplained stroke who presents today for implantable loop implantation.  The patient has had a  cryptogenic stroke.  Despite an extensive workup by neurology, no reversible causes have been identified.  she has worn telemetry during which she did not have arrhythmias.  There is significant concern for possible atrial fibrillation as the cause for the patients stroke.  The patient therefore presents today for implantable loop implantation.   DESCRIPTION OF PROCEDURE:  Informed written consent was obtained, and the patient was brought to the electrophysiology lab in a fasting state.  The patient required no sedation for the procedure today.  Mapping over the patient's chest was performed by the EP lab staff to identify the area where electrograms were most prominent for ILR recording.  This area was found to be the left parasternal region over the 3rd-4th intercostal space. The patients left chest was therefore prepped and draped in the usual sterile fashion by the EP lab staff. The skin overlying the left parasternal region was infiltrated with lidocaine for local analgesia.  A 0.5-cm incision was made over the left parasternal region over the 3rd intercostal space.  A subcutaneous ILR pocket was fashioned using a combination of sharp and blunt dissection.  A Med[employer] Reveal [place] [id number] implantable  loop recorder was then placed into the pocket  R waves were very prominent and measured 0.57mV. EBL<1 ml.  Steri- Strips and a sterile dressing were then applied.  There were no early apparent complications.   CONCLUSIONS:  1. Successful implantation of a Medtronic Reveal LINQ implantable loop recorder for cryptogenic stroke  2. No early apparent complications.   VAS [place] LOWER EXTREMITY VENOUS (DVT)  Result Date: 11/05/2020  Lower Venous DVT Study Indications: Pt with strok, PFO by TEE, pending loop implant.  Limitations: Body habitus and poor ultrasound/tissue interface. Comparison Study: no prior Performing Technologist: Blanch Media RVS  Examination Guidelines: A complete evaluation includes  B-mode imaging, spectral Doppler, color Doppler, and power Doppler as needed of all accessible portions of each vessel. Bilateral testing is considered an integral part of a complete examination. Limited examinations for reoccurring indications may be performed as noted. The reflux portion of the exam is performed with the patient in reverse Trendelenburg.  +---------+---------------+---------+-----------+----------+-------------------+ RIGHT    CompressibilityPhasicitySpontaneityPropertiesThrombus Aging      +---------+---------------+---------+-----------+----------+-------------------+ CFV      Full           Yes      Yes                                      +---------+---------------+---------+-----------+----------+-------------------+ SFJ      Full                                                             +---------+---------------+---------+-----------+----------+-------------------+ FV Prox  Full                                                             +---------+---------------+---------+-----------+----------+-------------------+ FV Mid   Full                                                             +---------+---------------+---------+-----------+----------+-------------------+ FV Distal               Yes      Yes                                      +---------+---------------+---------+-----------+----------+-------------------+ PFV      Full                                                             +---------+---------------+---------+-----------+----------+-------------------+ POP      Full           Yes      Yes                                      +---------+---------------+---------+-----------+----------+-------------------+  PTV      Full                                                             +---------+---------------+---------+-----------+----------+-------------------+ PERO                                                   Not well visualized +---------+---------------+---------+-----------+----------+-------------------+   +---------+---------------+---------+-----------+----------+-------------------+ LEFT     CompressibilityPhasicitySpontaneityPropertiesThrombus Aging      +---------+---------------+---------+-----------+----------+-------------------+ CFV      Full           Yes      Yes                                      +---------+---------------+---------+-----------+----------+-------------------+ SFJ      Full                                                             +---------+---------------+---------+-----------+----------+-------------------+ FV Prox  Full                                                             +---------+---------------+---------+-----------+----------+-------------------+ FV Mid                  Yes      Yes                                      +---------+---------------+---------+-----------+----------+-------------------+ FV Distal               Yes      Yes                                      +---------+---------------+---------+-----------+----------+-------------------+ PFV      Full                                                             +---------+---------------+---------+-----------+----------+-------------------+ POP      Full           Yes      Yes                                      +---------+---------------+---------+-----------+----------+-------------------+ PTV      Full                                                             +---------+---------------+---------+-----------+----------+-------------------+  PERO                                                  Not well visualized +---------+---------------+---------+-----------+----------+-------------------+     Summary: BILATERAL: - No evidence of deep vein thrombosis seen in the lower extremities, bilaterally. - No evidence of superficial  venous thrombosis in the lower extremities, bilaterally. -No evidence of popliteal cyst, bilaterally.   *See table(s) above for measurements and observations. Electronically signed by Sherald Hess MD on 11/05/2020 at 1:54:35 PM.    Final     PHYSICAL EXAM  Constitutional: Obese middle-aged African-American lady not in distress. Psych: Affect appropriate to situation  Eyes: No scleral injection  HENT: No OP obstruction  MSK: no joint deformities.  Cardiovascular: Normal rate and regular rhythm.  Respiratory: Effort normal, non-labored breathing  GI: Soft. No distension. There is no tenderness.  Skin: WDI  Neuro:  Mental Status:  Patient is awake, alert, oriented to person, place, month, year.  Aphasia, expressive, with word finding difficulty and substitutions.  Cranial Nerves:  II: She has a dense right hemianopia pupils are equal, round, and reactive to light.  III,IV, VI: EOMI without ptosis or diploplia.  V: Facial sensation is symmetric to temperature  VII: Facial movement is symmetric.  VIII: hearing is intact to voice  X: Uvula elevates symmetrically  XI: Shoulder shrug is symmetric.  XII: tongue is midline without atrophy or fasciculations.  Motor:  Tone is normal. Bulk is normal. 5/5 strength was present in all four extremities.  Sensory:  Sensation is symmetric to light touch and temperature in the arms and legs.  Cerebellar:  FNF and HKS are intact bilaterally   ASSESSMENT/PLAN [name] is a 60 y.o. female with a history of HTN,, HLD, DM2, Morbid obesity, CAD: Taxus drug eluting stent placed in the LAD in 2007 , Acute anterior STEMI June 2010 with very late thrombosis of the Taxus stent in the proximal LAD s/p balloon angioplasty, ischemic cardiomyopathy, Statin intolerance (declined PCSK9), who presents with visual change that is been going on since Friday 3/25.  She also has noticed trouble with speaking, but has not noticed much trouble with reading.   Due  to not improving, she sought care and med [facility]  on 3/29. There a CT/CTA was performed which demonstrates a sizable PCA infarct on the left with some petechial hemorrhage 60 year old female with PCA infarct. Given the degree of her petechial hemorrhage, monotherapy with aspirin for now was implemented.   Stroke: Left PCA stroke with occipital infarction and hemorrhagic conversion concerning for embolic source.    Code Stroke PCA infarct with petechial hemorrhage  CTA head  The left posterior cerebral artery is patent proximally. However, portions of the left posterior cerebral artery are poorly delineated beyond the P2/P3 junction and this portion of the vessel may be occluded. Elsewhere, no intracranial large vessel occlusion or proximal high-grade arterial stenosis is identified.Calcified plaque within the intracranial internal carotid arteries bilaterally with no more than mild stenosis.  CTA neck  The bilateral common carotid, internal carotid and vertebral arteries are patent within the neck without stenosis. No significant atherosclerotic disease. Cervical spondylosis and levels of fusion, as described.  MRI  Acute to subacute left occipital infarction with hemorrhagic conversion. No mass effect.  2D Echo Performed 10/22/20 EF 45-50%, +regional wall abnormalities, There  is severe akinesis of the left ventricular, mid-apical apical segment and anteroseptal wall. Findings suggest mid to distal LAD infarct/scar. Grade I diastolic dysfunction. Mild mitral valve regurgitation. No thrombus or shunt.     LDL 85  HgbA1c 10.8  VTE prophylaxis - Lovenox ppx    Diet   Diet Carb Modified Fluid consistency: Thin; Room service appropriate? Yes    On ASA 81mg  and Plavix 75 mg daily prior to admission  Preventive therapy: ASA 81mg  and Brilinta x 30 days then Brilinta alone if this is feasible financially otherwise she would need to return to previous ASA 81 and Plavix regimen.    TEE planned for tomorrow am. Loop placement consideration requested as well.   Therapy recommendations:  Cleared for home with HHPT, OT  Disposition:  Home   Hypertension . Permissive hypertension (OK if < 220/120) but gradually normalize in 5-7 days . Long-term BP goal normotensive  Hyperlipidemia  Home meds: Statin intolerant, has declined PCSK9.   LDL 85, not quite at goal < 70  Add Zetia 10mg  daily   High intensity statin is not recommended as cardiologist has documented statin intolerance.   Continue statin at discharge  Diabetes type II Uncontrolled  HgbA1c 10.8, goal < 7.0  CBGs Recent Labs    11/04/20 2205 11/05/20 0720 11/05/20 1128  GLUCAP 326* 272* 278*      Management per primary team  Right visual field deficit  No driving until further notice  PT/OT  Other Stroke Risk Factors  Former Cigarette and smokeless tobacco user   Obesity, Body mass index is 40.91 kg/m., BMI >/= 30 associated with increased stroke risk, recommend weight loss, diet and exercise as appropriate   Coronary artery disease  Other Active Problems     Hospital day # 0  Recommend ASA 81mg  and Brilinta x 30 days then Brilinta alone if this is feasible financially otherwise she would need to return to previous ASA 81 and Plavix regimen.   DC home after loop recorder placement later today.  Follow-up as an outpatient stroke clinic in 6 weeks.  Stroke team will sign off.  Kindly call for questions.   greater than 50% time during this 25-minute visit was spent on counseling and coordination of care about her cryptogenic stroke and suspected sleep apnea answering questions [phone], MD Medical Director 01/05/21 [facility] Center Pager: [phone] 11/05/2020 3:51 PM    To contact Stroke Continuity provider, please refer to Delia Heady. After hours, contact General Neurology

## 2020-11-05 NOTE — Progress Notes (Signed)
Patient verbalized understanding of d/c instructions. D/c home with Sister.

## 2020-11-05 NOTE — CV Procedure (Signed)
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  There were some smaller bubbles seen late consistent with some intrapulmonary shunting  MV with redundancy and trivial prolapse   Mild MR AV mildly thickened,   No AI PV normal  TV normal  LVEF is mild to to moderately depressed  Aorta with minimal plaquing   FUll report to follow in CV section of chart after images reviewed.  Procedure was without complications.  [name] MD

## 2020-11-05 NOTE — Anesthesia Postprocedure Evaluation (Signed)
Anesthesia Post Note  Patient: [name]  Procedure(s) Performed: TRANSESOPHAGEAL ECHOCARDIOGRAM (TEE) (N/A ) BUBBLE STUDY     Patient location during evaluation: Endoscopy Anesthesia Type: MAC Level of consciousness: awake and alert Pain management: pain level controlled Vital Signs Assessment: post-procedure vital signs reviewed and stable Respiratory status: spontaneous breathing, nonlabored ventilation, respiratory function stable and patient connected to nasal cannula oxygen Cardiovascular status: stable and blood pressure returned to baseline Postop Assessment: no apparent nausea or vomiting Anesthetic complications: no   No complications documented.  Last Vitals:  Vitals:   11/05/20 0933 11/05/20 0943  BP: (!) 118/52 (!) 115/43  Pulse: 81 82  Resp: (!) 22 (!) 23  Temp:    SpO2: 98% 99%    Last Pain:  Vitals:   11/05/20 0943  TempSrc:   PainSc: 0-No pain                 Ricky Gallery COKER

## 2020-11-05 NOTE — Anesthesia Procedure Notes (Signed)
Procedure Name: MAC Date/Time: 11/05/2020 8:43 AM Performed by: Colin Benton, CRNA Pre-anesthesia Checklist: Patient identified, Emergency Drugs available, Suction available and Patient being monitored Patient Re-evaluated:Patient Re-evaluated prior to induction Oxygen Delivery Method: Nasal cannula Induction Type: IV induction Placement Confirmation: positive ETCO2 Dental Injury: Teeth and Oropharynx as per pre-operative assessment

## 2020-11-05 NOTE — TOC Benefit Eligibility Note (Signed)
Transition of Care [facility]) Benefit Eligibility Note    Patient Details  Name: [name] MRN: [id number] Date of Birth: [birth year]   Medication/Dose: BRILINTA  90 MG BID  Covered?: Yes  Tier: 3 Drug  Prescription Coverage [facility]  , [facility] and  [facility]  Spoke with Person/Company/Phone Number:: JOSE  @ [employer]   [phone]  Co-Pay: $45.00  Prior Approval: No  Deductible:  (NO DEDUCTIBLE WITH PLAN and OUT-OF-POCKET: UNMET)  Additional Notes: ALEX @ [facility]  CO-PAY- $47.00    RX  HAS BE[facility]ED [facility]  and  PT'S HAS TWO INS INS POLICY    Mardene Sayer Phone Number: 11/05/2020, 4:00 PM

## 2020-11-05 NOTE — TOC Transition Note (Signed)
Transition of Care [facility]) - CM/SW Discharge Note   Patient Details  Name: [name] MRN: [id number] Date of Birth: 1961/04/22  Transition of Care [facility]) CM/SW Contact:  Lorri Frederick, LCSW Phone Number: 11/05/2020, 4:16 PM   Clinical Narrative:   Pt discharging home with [facility].  Equipment from [employer].  Some confusion on benefit check: $47 is the correct amount.  Pt aware. Sister here to transport home.     Final next level of care: Home w Home Health Services Barriers to Discharge: Barriers Resolved   Patient Goals and CMS Choice Patient states their goals for this hospitalization and ongoing recovery are:: "get my eating right" CMS Medicare.gov Compare Post Acute Care list provided to:: Patient Choice offered to / list presented to : Patient  Discharge Placement                       Discharge Plan and Services In-house Referral: Clinical Social Work   Post Acute Care Choice: Home Health          DME Arranged: Dan Humphreys rolling DME Agency: [employer] Date DME Agency Contacted: 11/04/20 Time DME Agency Contacted: [phone] Representative spoke with at DME Agency: Velna Hatchet HH Arranged: PT,OT HH Agency: [facility] Date HH Agency Contacted: 11/04/20 Time HH Agency Contacted: [phone] Representative spoke with at [facility] Agency: [place]  Social Determinants of Health (SDOH) Interventions     Readmission Risk Interventions No flowsheet data found.

## 2020-11-05 NOTE — Progress Notes (Signed)
Lower extremity venous has been completed.   Preliminary results in CV Proc.   Blanch Media 11/05/2020 11:07 AM

## 2020-11-05 NOTE — Transfer of Care (Signed)
Immediate Anesthesia Transfer of Care Note  Patient: [name]  Procedure(s) Performed: TRANSESOPHAGEAL ECHOCARDIOGRAM (TEE) (N/A ) BUBBLE STUDY  Patient Location: Endoscopy Unit  Anesthesia Type:MAC  Level of Consciousness: drowsy  Airway & Oxygen Therapy: Patient Spontanous Breathing and Patient connected to nasal cannula oxygen  Post-op Assessment: Report given to RN and Post -op Vital signs reviewed and stable  Post vital signs: Reviewed and stable  Last Vitals:  Vitals Value Taken Time  BP    Temp    Pulse 87 11/05/20 0914  Resp 28 11/05/20 0914  SpO2 96 % 11/05/20 0914  Vitals shown include unvalidated device data.  Last Pain:  Vitals:   11/05/20 0751  TempSrc: Temporal  PainSc: 0-No pain         Complications: No complications documented.

## 2020-11-05 NOTE — Discharge Summary (Signed)
Discharge Summary  [name] WUJ:[id number] DOB: May 24, 1961  PCP: [name], MD  Admit date: 11/02/2020 Discharge date: 11/05/2020  Time spent: 40 mins  Recommendations for Outpatient Follow-up:  1. Follow-up with neurology as scheduled 2. Follow-up with PCP in 1 week 3. Follow-up with EP as scheduled  Discharge Diagnoses:  Active Hospital Problems   Diagnosis Date Noted  . Stroke (HCC) 11/02/2020  . Aphasia   . CAD, NATIVE VESSEL 02/10/2009  . Hyperlipidemia 02/10/2009  . Type 2 diabetes mellitus with hyperlipidemia (HCC) 04/23/2007  . Essential hypertension 04/23/2007    Resolved Hospital Problems  No resolved problems to display.    Discharge Condition: Stable  Diet recommendation: Heart healthy/moderate carb  Vitals:   11/05/20 0943 11/05/20 1131  BP: (!) 115/43 138/69  Pulse: 82 85  Resp: (!) 23 15  Temp: 98.5 F (36.9 C) 98.7 F (37.1 C)  SpO2: 99% 100%    History of present illness:  [name] a 60 y.o.femalewith medical history significant forCAD s/p stent , HTN, HLD and Type 2 DM who presents as a transfer from [facility] 4 days of right peripheral vision loss and new onset aphasia. Patient had difficulty recalling history given aphasia and difficulty with word finding. History mostly obtained from documentation that was given by her sister who is not at bedside at this time. Patient is right-handed and wasseen for her decreased right peripheral visionvision on 3/25 in the EDbut due to extreme claustrophobia to MRI she declined further work-up and wanted follow-up with her ophthalmologist. She presented back to the ED because of the addition of new onset aphasiaanddifficulty word finding. In the ED, was afebrile and hypertensive up to 160s systolic. CTA head and neck showed acute/early subactue left temporal occipital PCA territory infarct with mild petechial hemorrhage. Parts of left posterior cerebral artery may also be  occluded.  Neurology consulted.  Patient admitted for further management.    Today, patient denies any new complaints, very eager to be discharged.  Aphasia continues to be improving, still with peripheral vision loss on the right, denies any palpitations, shortness of breath, chest pain, abdominal pain, nausea/vomiting, fever/chills.  Advised to be compliant with diet, medication, and follow-up appointments.  Discussed the need to avoid driving until cleared by neurology/ophthalmologist due to peripheral vision loss.    Hospital Course:  Principal Problem:   Stroke [facility]) Active Problems:   Type 2 diabetes mellitus with hyperlipidemia (HCC)   Hyperlipidemia   Essential hypertension   CAD, NATIVE VESSEL   Aphasia    Acute to subacute left occipital infarction MRI showed above with hemorrhagic conversion, no mass-effect noted CTA head showed no LVO but occluded P2/P3 junction of the left posterior cerebral artery Echo done on 10/22/2020 showed EF of 45 to 50%, grade 1 diastolic dysfunction, severe akinesis of the left ventricular anterior septal wall, mild mitral valve regurgitation TEE shows small PFO, lower extremity venous Dopplers are negative for DVT, no LA, LAA mass noted Loop recorder placed on 11/05/20  LDL 85, A1c 10.8 Neurology/stroke team consulted, outpatient follow-up PT/OT/SLP-recommend home health PT/OT/SLP Continue aspirin, Brilinta, discontinued home Plavix  Hypokalemia Replaced as needed  Chronic combined systolic and diastolic HF Appears euvolemic Echo with EF of 45 to 50%, grade 1 diastolic dysfunction Continue Coreg, losartan  Hypertension Continue Coreg, losartan  Diabetes mellitus type 2, uncontrolled with hyperglycemia A1c 10.8 Continue home regimen  Follow-up with PCP  CAD s/p stent/hyperlipidemia Chest pain-free Continue aspirin, Zetia, brillanta  Estimated body mass index is 40.91 kg/m as calculated from the following:   Height as  of this encounter: 5\' 4"  (1.626 m).   Weight as of this encounter: 108.1 kg.      Procedures:  TEE and loop recorder placement on 11/05/2020  Consultations:  Neurology  Cardiology    Discharge Exam: BP 138/69   Pulse 85   Temp 98.7 F (37.1 C) (Oral)   Resp 15   Ht 5\' 4"  (1.626 m)   Wt 108.1 kg   SpO2 100%   BMI 40.91 kg/m   General: NAD, noted mild aphasia Cardiovascular: S1, S2 present Respiratory: CTA B Neurology: Strength 5 out of 5 in all extremities, noted right hemianopia     Discharge Instructions You were cared for by a hospitalist during your hospital stay. If you have any questions about your discharge medications or the care you received while you were in the hospital after you are discharged, you can call the unit and asked to speak with the hospitalist on call if the hospitalist that took care of you is not available. Once you are discharged, your primary care physician will handle any further medical issues. Please note that NO REFILLS for any discharge medications will be authorized once you are discharged, as it is imperative that you return to your primary care physician (or establish a relationship with a primary care physician if you do not have one) for your aftercare needs so that they can reassess your need for medications and monitor your lab values.  Discharge Instructions    Ambulatory referral to Neurology   Complete by: As directed    An appointment is requested in approximately:4 weeks   Diet - low sodium heart healthy   Complete by: As directed    Increase activity slowly   Complete by: As directed      Allergies as of 11/05/2020   No Active Allergies     Medication List    STOP taking these medications   clopidogrel 75 MG tablet Commonly known as: PLAVIX     TAKE these medications   aspirin 81 MG tablet Take 81 mg by mouth daily with breakfast.   carvedilol 6.25 MG tablet Commonly known as: COREG Take 1 tablet (6.25 mg  total) by mouth 2 (two) times daily with a meal.   cyclobenzaprine 10 MG tablet Commonly known as: FLEXERIL Take 10 mg by mouth 2 (two) times daily as needed for muscle spasms.   ezetimibe 10 MG tablet Commonly known as: ZETIA Take 1 tablet (10 mg total) by mouth daily. Start taking on: November 06, 2020   glyBURIDE-metformin 5-500 MG tablet Commonly known as: GLUCOVANCE Take 2 tablets by mouth 2 (two) times daily with a meal.   metFORMIN 1000 MG tablet Commonly known as: GLUCOPHAGE Take 1,000 mg by mouth 2 (two) times daily.   ONE TOUCH ULTRA TEST test strip Generic drug: glucose blood   ticagrelor 90 MG Tabs tablet Commonly known as: BRILINTA Take 1 tablet (90 mg total) by mouth 2 (two) times daily.   traMADol 50 MG tablet Commonly known as: ULTRAM Take 50 mg by mouth every 12 (twelve) hours as needed for moderate pain.   Evaristo Buryresiba FlexTouch 200 UNIT/ML FlexTouch Pen Generic drug: insulin degludec Inject 50 Units into the skin daily.   valsartan 80 MG tablet Commonly known as: DIOVAN Take 80 mg by mouth at bedtime.            Durable Medical Equipment  (  From admission, onward)         Start     Ordered   11/04/20 1540  For home use only DME Walker rolling  Once       Question Answer Comment  Walker: With 5 Inch Wheels   Patient needs a walker to treat with the following condition Stroke (HCC)      11/04/20 1539         No Active Allergies  Follow-up Information    Graciella Freer, PA-C Follow up.   Specialty: Physician Assistant Why: 11/15/20 @ 8:50AM, wound check visit (heart monitor) Contact information: [address] [place] [address] [phone]        [facility] Follow up.   Specialty: Home Health Services Why: [facility] will contact you to schedule your first appointment.  Contact information: [address] [place] [address] [phone]        [name], MD. Schedule an  appointment as soon as possible for a visit in 1 week(s).   Specialty: Internal Medicine Contact information: [address] [place] [address] [phone]        Kathleene Hazel, MD .   Specialty: Cardiology Contact information: [address] [place] [address] [phone]                The results of significant diagnostics from this hospitalization (including imaging, microbiology, ancillary and laboratory) are listed below for reference.    Significant Diagnostic Studies: CT Angio Head W or Wo Contrast  Result Date: 11/02/2020 CLINICAL DATA:  Neuro deficit, acute, stroke suspected; right-sided blurriness and speech difficulty for the last 4 days. EXAM: CT ANGIOGRAPHY HEAD AND NECK TECHNIQUE: Multidetector CT imaging of the head and neck was performed using the standard protocol during bolus administration of intravenous contrast. Multiplanar CT image reconstructions and MIPs were obtained to evaluate the vascular anatomy. Carotid stenosis measurements (when applicable) are obtained utilizing NASCET criteria, using the distal internal carotid diameter as the denominator. CONTRAST:  OMNIPAQUE IOHEXOL 350 MG/ML SOLN COMPARISON:  No pertinent prior exams available for comparison. FINDINGS: CT HEAD FINDINGS Brain: Please note portions of the cerebellum and temporal lobes are excluded from the field of view inferiorly. Mild cerebral and cerebellar atrophy. Abnormal hypodensity within the left temporal occipital lobes (PCA vascular territory) compatible with acute/early subacute cortical/subcortical infarction. This measures 5.6 x 2.0 cm in transaxial dimensions. There is mild hyperdensity within the infarction territory likely affecting petechial hemorrhage. Background patchy and ill-defined hypoattenuation within cerebral white matter which is nonspecific, but compatible with chronic small vessel ischemic disease. Chronic right thalamic  lacunar infarct No extra-axial fluid collection. No evidence of intracranial mass. No midline shift. Vascular: No hyperdense vessel. Skull: Negative for fracture or focal suspicious osseous lesion at the imaged levels. Sinuses: No significant paranasal sinus disease at the imaged levels. Orbits: No orbital mass or acute finding. Review of the MIP images confirms the above findings CTA NECK FINDINGS Aortic arch: Standard aortic branching. The visualized aortic arch is normal in caliber. No hemodynamically significant innominate or proximal subclavian artery stenosis. Right carotid system: CCA and ICA patent within the neck without stenosis. No significant atherosclerotic disease. Left carotid system: CCA and ICA patent within the neck without stenosis. No significant atherosclerotic disease. Vertebral arteries: Vertebral arteries patent within neck without stenosis. Left vertebral artery dominant. Skeleton: No acute bony abnormality or aggressive osseous lesion. Cervical spondylosis with multilevel disc  space narrowing, disc bulges, central disc protrusions, endplate spurring and uncovertebral hypertrophy. Apparent fusion across the C6-C7 disc space. Additionally, disc space narrowing is severe at C7-T1. Prominent multilevel bridging ventral osteophytes. Other neck: 10 mm left thyroid lobe nodule not meeting consensus criteria for ultrasound follow-up. No neck mass or cervical lymphadenopathy Upper chest: No consolidation within the imaged lung apices. Review of the MIP images confirms the above findings CTA HEAD FINDINGS Anterior circulation: The intracranial internal carotid arteries are patent. Calcified plaque within both vessels with no more than mild stenosis. The M1 middle cerebral arteries are patent. No M2 proximal branch occlusion or high-grade proximal stenosis is identified. The anterior cerebral arteries are patent. Posterior circulation: The intracranial vertebral arteries are patent. The basilar artery  is patent. The posterior cerebral arteries are patent proximally. Portions of the left posterior cerebral artery are poorly delineated beyond the P2/P3 junction and this portion of the vessel may be occluded. Posterior communicating arteries are present bilaterally. Venous sinuses: Within the limitations of contrast timing, no convincing thrombus. Anatomic variants: None significant Review of the MIP images confirms the above findings These results were called by telephone at the time of interpretation on 11/02/2020 at 6:03 pm to provider [facility] , who verbally acknowledged these results. IMPRESSION: CT head: 1. Please note portions of the cerebellum and temporal lobes are excluded from the field of view inferiorly 2. Acute/early subacute left temporal occipital PCA territory infarct with mild petechial hemorrhage. 3. Background cerebral white matter chronic small vessel ischemic disease. 4. Chronic right thalamic lacunar infarct 5. Mild generalized parenchymal atrophy. CTA neck: 1. The bilateral common carotid, internal carotid and vertebral arteries are patent within the neck without stenosis. No significant atherosclerotic disease. 2. Cervical spondylosis and levels of fusion, as described. CTA head: 1. The left posterior cerebral artery is patent proximally. However, portions of the left posterior cerebral artery are poorly delineated beyond the P2/P3 junction and this portion of the vessel may be occluded. 2. Elsewhere, no intracranial large vessel occlusion or proximal high-grade arterial stenosis is identified. 3. Calcified plaque within the intracranial internal carotid arteries bilaterally with no more than mild stenosis. Electronically Signed   By: Jackey Loge DO   On: 11/02/2020 18:05   CT Angio Neck W and/or Wo Contrast  Result Date: 11/02/2020 CLINICAL DATA:  Neuro deficit, acute, stroke suspected; right-sided blurriness and speech difficulty for the last 4 days. EXAM: CT ANGIOGRAPHY  HEAD AND NECK TECHNIQUE: Multidetector CT imaging of the head and neck was performed using the standard protocol during bolus administration of intravenous contrast. Multiplanar CT image reconstructions and MIPs were obtained to evaluate the vascular anatomy. Carotid stenosis measurements (when applicable) are obtained utilizing NASCET criteria, using the distal internal carotid diameter as the denominator. CONTRAST:  OMNIPAQUE IOHEXOL 350 MG/ML SOLN COMPARISON:  No pertinent prior exams available for comparison. FINDINGS: CT HEAD FINDINGS Brain: Please note portions of the cerebellum and temporal lobes are excluded from the field of view inferiorly. Mild cerebral and cerebellar atrophy. Abnormal hypodensity within the left temporal occipital lobes (PCA vascular territory) compatible with acute/early subacute cortical/subcortical infarction. This measures 5.6 x 2.0 cm in transaxial dimensions. There is mild hyperdensity within the infarction territory likely affecting petechial hemorrhage. Background patchy and ill-defined hypoattenuation within cerebral white matter which is nonspecific, but compatible with chronic small vessel ischemic disease. Chronic right thalamic lacunar infarct No extra-axial fluid collection. No evidence of intracranial mass. No midline shift. Vascular: No hyperdense vessel. Skull: Negative  for fracture or focal suspicious osseous lesion at the imaged levels. Sinuses: No significant paranasal sinus disease at the imaged levels. Orbits: No orbital mass or acute finding. Review of the MIP images confirms the above findings CTA NECK FINDINGS Aortic arch: Standard aortic branching. The visualized aortic arch is normal in caliber. No hemodynamically significant innominate or proximal subclavian artery stenosis. Right carotid system: CCA and ICA patent within the neck without stenosis. No significant atherosclerotic disease. Left carotid system: CCA and ICA patent within the neck without  stenosis. No significant atherosclerotic disease. Vertebral arteries: Vertebral arteries patent within neck without stenosis. Left vertebral artery dominant. Skeleton: No acute bony abnormality or aggressive osseous lesion. Cervical spondylosis with multilevel disc space narrowing, disc bulges, central disc protrusions, endplate spurring and uncovertebral hypertrophy. Apparent fusion across the C6-C7 disc space. Additionally, disc space narrowing is severe at C7-T1. Prominent multilevel bridging ventral osteophytes. Other neck: 10 mm left thyroid lobe nodule not meeting consensus criteria for ultrasound follow-up. No neck mass or cervical lymphadenopathy Upper chest: No consolidation within the imaged lung apices. Review of the MIP images confirms the above findings CTA HEAD FINDINGS Anterior circulation: The intracranial internal carotid arteries are patent. Calcified plaque within both vessels with no more than mild stenosis. The M1 middle cerebral arteries are patent. No M2 proximal branch occlusion or high-grade proximal stenosis is identified. The anterior cerebral arteries are patent. Posterior circulation: The intracranial vertebral arteries are patent. The basilar artery is patent. The posterior cerebral arteries are patent proximally. Portions of the left posterior cerebral artery are poorly delineated beyond the P2/P3 junction and this portion of the vessel may be occluded. Posterior communicating arteries are present bilaterally. Venous sinuses: Within the limitations of contrast timing, no convincing thrombus. Anatomic variants: None significant Review of the MIP images confirms the above findings These results were called by telephone at the time of interpretation on 11/02/2020 at 6:03 pm to provider [facility] , who verbally acknowledged these results. IMPRESSION: CT head: 1. Please note portions of the cerebellum and temporal lobes are excluded from the field of view inferiorly 2. Acute/early  subacute left temporal occipital PCA territory infarct with mild petechial hemorrhage. 3. Background cerebral white matter chronic small vessel ischemic disease. 4. Chronic right thalamic lacunar infarct 5. Mild generalized parenchymal atrophy. CTA neck: 1. The bilateral common carotid, internal carotid and vertebral arteries are patent within the neck without stenosis. No significant atherosclerotic disease. 2. Cervical spondylosis and levels of fusion, as described. CTA head: 1. The left posterior cerebral artery is patent proximally. However, portions of the left posterior cerebral artery are poorly delineated beyond the P2/P3 junction and this portion of the vessel may be occluded. 2. Elsewhere, no intracranial large vessel occlusion or proximal high-grade arterial stenosis is identified. 3. Calcified plaque within the intracranial internal carotid arteries bilaterally with no more than mild stenosis. Electronically Signed   By: Jackey Loge DO   On: 11/02/2020 18:05   MR BRAIN WO CONTRAST  Result Date: 11/03/2020 CLINICAL DATA:  Stroke, follow-up EXAM: MRI HEAD WITHOUT CONTRAST TECHNIQUE: Multiplanar, multiecho pulse sequences of the brain and surrounding structures were obtained without intravenous contrast. COMPARISON:  Prior CT imaging FINDINGS: Brain: Mildly reduced diffusion in the parasagittal left occipital lobe with corresponding susceptibility. Patchy and confluent areas of T2 hyperintensity in the supratentorial and pontine white matter are nonspecific but may reflect moderate chronic microvascular ischemic changes. There are chronic small vessel infarcts of the pons, thalamus, and left cerebellum. There is  no significant mass effect. No hydrocephalus or extra-axial collection. Vascular: Major vessel flow voids at the skull base are preserved. Skull and upper cervical spine: Normal marrow signal is preserved. Sinuses/Orbits: Paranasal sinuses are aerated. Orbits are unremarkable. Other: Sella is  unremarkable.  Mastoid air cells are clear. IMPRESSION: Acute to subacute left occipital infarction with hemorrhagic conversion. No mass effect. Chronic microvascular ischemic changes and chronic small-vessel infarcts. Electronically Signed   By: Guadlupe Spanish M.D.   On: 11/03/2020 11:45   EP PPM/ICD IMPLANT  Result Date: 11/05/2020 SURGEON:  Loman Brooklyn, MD   PREPROCEDURE DIAGNOSIS:  Cryptogenic Stroke   POSTPROCEDURE DIAGNOSIS:  Cryptogenic Stroke    PROCEDURES:  1. Implantable loop recorder implantation   INTRODUCTION:  [name] is a 60 y.o. female with a history of unexplained stroke who presents today for implantable loop implantation.  The patient has had a cryptogenic stroke.  Despite an extensive workup by neurology, no reversible causes have been identified.  she has worn telemetry during which she did not have arrhythmias.  There is significant concern for possible atrial fibrillation as the cause for the patients stroke.  The patient therefore presents today for implantable loop implantation.   DESCRIPTION OF PROCEDURE:  Informed written consent was obtained, and the patient was brought to the electrophysiology lab in a fasting state.  The patient required no sedation for the procedure today.  Mapping over the patient's chest was performed by the EP lab staff to identify the area where electrograms were most prominent for ILR recording.  This area was found to be the left parasternal region over the 3rd-4th intercostal space. The patients left chest was therefore prepped and draped in the usual sterile fashion by the EP lab staff. The skin overlying the left parasternal region was infiltrated with lidocaine for local analgesia.  A 0.5-cm incision was made over the left parasternal region over the 3rd intercostal space.  A subcutaneous ILR pocket was fashioned using a combination of sharp and blunt dissection.  A Medtronic Reveal Linq model [place] [id number] implantable loop recorder was  then placed into the pocket  R waves were very prominent and measured 0.34mV. EBL<1 ml.  Steri- Strips and a sterile dressing were then applied.  There were no early apparent complications.   CONCLUSIONS:  1. Successful implantation of a Medtronic Reveal LINQ implantable loop recorder for cryptogenic stroke  2. No early apparent complications.   ECHOCARDIOGRAM COMPLETE  Result Date: 10/22/2020    ECHOCARDIOGRAM REPORT   Patient Name:   [name] Date of Exam: 10/22/2020 Medical Rec #:  [id number]          Height:       64.0 in Accession #:    [id number]         Weight:       249.0 lb Date of Birth:  26-Apr-1961          BSA:          2.147 m Patient Age:    60 years           BP:           140/70 mmHg Patient Gender: F                  HR:           83 bpm. Exam Location:  [address] Procedure: 2D Echo, Cardiac Doppler, Color Doppler and Intracardiac  Opacification Agent Indications:    I25.10 CAD  History:        Patient has no prior history of Echocardiogram examinations.                 Cardiomyopathy, CAD and Previous Myocardial Infarction; Risk                 Factors:Family History of Coronary Artery Disease, Hypertension,                 Diabetes, Dyslipidemia and Former Smoker.  Sonographer:    Farrel Conners RDCS Referring Phys: [id number] CHRISTOPHER D MCALHANY IMPRESSIONS  1. Left ventricular ejection fraction, by estimation, is 45 to 50%. The left ventricle has mildly decreased function. The left ventricle demonstrates regional wall motion abnormalities (see scoring diagram/findings for description). Left ventricular diastolic parameters are consistent with Grade I diastolic dysfunction (impaired relaxation). There is severe akinesis of the left ventricular, mid-apical apical segment and anteroseptal wall. Findings suggest mid to distal LAD infarct/scar.  2. Right ventricular systolic function is normal. The right ventricular size is normal.  3. The mitral valve is abnormal. Mild  mitral valve regurgitation.  4. The aortic valve is tricuspid. Aortic valve regurgitation is not visualized.  5. The inferior vena cava is normal in size with greater than 50% respiratory variability, suggesting right atrial pressure of 3 mmHg. Comparison(s): No prior Echocardiogram. FINDINGS  Left Ventricle: No apical thrombus identified by Defininty contrast. Left ventricular ejection fraction, by estimation, is 45 to 50%. The left ventricle has mildly decreased function. The left ventricle demonstrates regional wall motion abnormalities. Severe akinesis of the left ventricular, mid-apical apical segment and anteroseptal wall. Definity contrast agent was given IV to delineate the left ventricular endocardial borders. The left ventricular internal cavity size was normal in size. There is borderline left ventricular hypertrophy. Left ventricular diastolic parameters are consistent with Grade I diastolic dysfunction (impaired relaxation). Indeterminate filling pressures.  LV Wall Scoring: The apical septal segment, apical anterior segment, and apex are akinetic. Findings suggest mid to distal LAD infarct/scar. Right Ventricle: The right ventricular size is normal. No increase in right ventricular wall thickness. Right ventricular systolic function is normal. Left Atrium: Left atrial size was normal in size. Right Atrium: Right atrial size was normal in size. Pericardium: There is no evidence of pericardial effusion. Mitral Valve: The mitral valve is abnormal. There is mild thickening of the mitral valve leaflet(s). Mild mitral valve regurgitation. Tricuspid Valve: The tricuspid valve is grossly normal. Tricuspid valve regurgitation is trivial. Aortic Valve: The aortic valve is tricuspid. Aortic valve regurgitation is not visualized. Pulmonic Valve: The pulmonic valve was normal in structure. Pulmonic valve regurgitation is not visualized. Aorta: The aortic root and ascending aorta are structurally normal, with no  evidence of dilitation. Venous: The inferior vena cava is normal in size with greater than 50% respiratory variability, suggesting right atrial pressure of 3 mmHg. IAS/Shunts: No atrial level shunt detected by color flow Doppler.  LEFT VENTRICLE PLAX 2D LVIDd:         4.85 cm  Diastology LVIDs:         3.75 cm  LV e' medial:    5.66 cm/s LV PW:         0.80 cm  LV E/e' medial:  16.7 LV IVS:        0.95 cm  LV e' lateral:   8.59 cm/s LVOT diam:     2.10 cm  LV E/e' lateral: 11.0 LV SV:  56 LV SV Index:   26 LVOT Area:     3.46 cm  RIGHT VENTRICLE RV S prime:     10.70 cm/s TAPSE (M-mode): 1.9 cm LEFT ATRIUM             Index       RIGHT ATRIUM           Index LA diam:        4.40 cm 2.05 cm/m  RA Area:     14.80 cm LA Vol (A2C):   57.2 ml 26.64 ml/m RA Volume:   36.70 ml  17.09 ml/m LA Vol (A4C):   43.6 ml 20.30 ml/m LA Biplane Vol: 53.6 ml 24.96 ml/m  AORTIC VALVE LVOT Vmax:   65.80 cm/s LVOT Vmean:  43.300 cm/s LVOT VTI:    0.162 m  AORTA Ao Root diam: 3.10 cm Ao Asc diam:  3.20 cm MITRAL VALVE MV Area (PHT): cm          SHUNTS MV Decel Time: 239 msec     Systemic VTI:  0.16 m MR Peak grad: 110.9 mmHg    Systemic Diam: 2.10 cm MR Mean grad: 70.5 mmHg MR Vmax:      526.50 cm/s MR Vmean:     394.5 cm/s MV E velocity: 94.70 cm/s MV A velocity: 101.00 cm/s MV E/A ratio:  0.94 Zoila Shutter MD Electronically signed by Zoila Shutter MD Signature Date/Time: 10/22/2020/3:17:34 PM    Final    VAS [place] LOWER EXTREMITY VENOUS (DVT)  Result Date: 11/05/2020  Lower Venous DVT Study Indications: Pt with strok, PFO by TEE, pending loop implant.  Limitations: Body habitus and poor ultrasound/tissue interface. Comparison Study: no prior Performing Technologist: Blanch Media RVS  Examination Guidelines: A complete evaluation includes B-mode imaging, spectral Doppler, color Doppler, and power Doppler as needed of all accessible portions of each vessel. Bilateral testing is considered an integral part of a complete  examination. Limited examinations for reoccurring indications may be performed as noted. The reflux portion of the exam is performed with the patient in reverse Trendelenburg.  +---------+---------------+---------+-----------+----------+-------------------+ RIGHT    CompressibilityPhasicitySpontaneityPropertiesThrombus Aging      +---------+---------------+---------+-----------+----------+-------------------+ CFV      Full           Yes      Yes                                      +---------+---------------+---------+-----------+----------+-------------------+ SFJ      Full                                                             +---------+---------------+---------+-----------+----------+-------------------+ FV Prox  Full                                                             +---------+---------------+---------+-----------+----------+-------------------+ FV Mid   Full                                                             +---------+---------------+---------+-----------+----------+-------------------+  FV Distal               Yes      Yes                                      +---------+---------------+---------+-----------+----------+-------------------+ PFV      Full                                                             +---------+---------------+---------+-----------+----------+-------------------+ POP      Full           Yes      Yes                                      +---------+---------------+---------+-----------+----------+-------------------+ PTV      Full                                                             +---------+---------------+---------+-----------+----------+-------------------+ PERO                                                  Not well visualized +---------+---------------+---------+-----------+----------+-------------------+    +---------+---------------+---------+-----------+----------+-------------------+ LEFT     CompressibilityPhasicitySpontaneityPropertiesThrombus Aging      +---------+---------------+---------+-----------+----------+-------------------+ CFV      Full           Yes      Yes                                      +---------+---------------+---------+-----------+----------+-------------------+ SFJ      Full                                                             +---------+---------------+---------+-----------+----------+-------------------+ FV Prox  Full                                                             +---------+---------------+---------+-----------+----------+-------------------+ FV Mid                  Yes      Yes                                      +---------+---------------+---------+-----------+----------+-------------------+ FV Distal  Yes      Yes                                      +---------+---------------+---------+-----------+----------+-------------------+ PFV      Full                                                             +---------+---------------+---------+-----------+----------+-------------------+ POP      Full           Yes      Yes                                      +---------+---------------+---------+-----------+----------+-------------------+ PTV      Full                                                             +---------+---------------+---------+-----------+----------+-------------------+ PERO                                                  Not well visualized +---------+---------------+---------+-----------+----------+-------------------+     Summary: BILATERAL: - No evidence of deep vein thrombosis seen in the lower extremities, bilaterally. - No evidence of superficial venous thrombosis in the lower extremities, bilaterally. -No evidence of popliteal cyst, bilaterally.   *See  table(s) above for measurements and observations. Electronically signed by Sherald Hess MD on 11/05/2020 at 1:54:35 PM.    Final     Microbiology: Recent Results (from the past 240 hour(s))  Urine culture     Status: Abnormal   Collection Time: 11/02/20  4:30 PM   Specimen: Urine, Random  Result Value Ref Range Status   Specimen Description   Final    URINE, RANDOM Performed at [facility], [address] [place], [place] [address]    Special Requests   Final    NONE Performed at [facility], [address] [place], [place] [address]    Culture MULTIPLE SPECIES PRESENT, SUGGEST RECOLLECTION (A)  Final   Report Status 11/04/2020 FINAL  Final  Resp Panel by RT-PCR (Flu A&B, Covid) Nasopharyngeal Swab     Status: None   Collection Time: 11/02/20  7:22 PM   Specimen: Nasopharyngeal Swab; Nasopharyngeal(NP) swabs in vial transport medium  Result Value Ref Range Status   SARS Coronavirus 2 by RT PCR NEGATIVE NEGATIVE Final    Comment: (NOTE) SARS-CoV-2 target nucleic acids are NOT DETECTED.  The SARS-CoV-2 RNA is generally detectable in upper respiratory specimens during the acute phase of infection. The lowest concentration of SARS-CoV-2 viral copies this assay can detect is 138 copies/mL. A negative result does not preclude SARS-Cov-2 infection and should not be used as the sole basis for treatment or other patient management decisions. A negative result may occur with  improper specimen collection/handling,  submission of specimen other than nasopharyngeal swab, presence of viral mutation(s) within the areas targeted by this assay, and inadequate number of viral copies(<138 copies/mL). A negative result must be combined with clinical observations, patient history, and epidemiological information. The expected result is Negative.  Fact Sheet for Patients:  BloggerCourse.com  Fact Sheet for Healthcare Providers:   SeriousBroker.it  This test is no t yet approved or cleared by the [place] FDA and  has been authorized for detection and/or diagnosis of SARS-CoV-2 by FDA under an Emergency Use Authorization (EUA). This EUA will remain  in effect (meaning this test can be used) for the duration of the COVID-19 declaration under Section 564(b)(1) of the Act, 21 U.S.C.section 360bbb-3(b)(1), unless the authorization is terminated  or revoked sooner.       Influenza A by PCR NEGATIVE NEGATIVE Final   Influenza B by PCR NEGATIVE NEGATIVE Final    Comment: (NOTE) The Xpert Xpress SARS-CoV-2/FLU/RSV plus assay is intended as an aid in the diagnosis of influenza from Nasopharyngeal swab specimens and should not be used as a sole basis for treatment. Nasal washings and aspirates are unacceptable for Xpert Xpress SARS-CoV-2/FLU/RSV testing.  Fact Sheet for Patients: BloggerCourse.com  Fact Sheet for Healthcare Providers: SeriousBroker.it  This test is not yet approved or cleared by the [place] FDA and has been authorized for detection and/or diagnosis of SARS-CoV-2 by FDA under an Emergency Use Authorization (EUA). This EUA will remain in effect (meaning this test can be used) for the duration of the COVID-19 declaration under Section 564(b)(1) of the Act, 21 U.S.C. section 360bbb-3(b)(1), unless the authorization is terminated or revoked.  Performed at [facility], [address] [place], [place] [address]      Labs: Basic Metabolic Panel: Recent Labs  Lab 10/29/20 2009 11/02/20 1630 11/03/20 0344 11/04/20 0204 11/05/20 0158  NA 139 136 139 139 139  K 3.0* 3.0* 2.9* 3.2* 3.4*  CL 102 100 104 104 105  CO2 GLUCOSE 288* 258* 109* 198* 303*  BUN 9 10 6* 7* 8  CREATININE 0.80 0.75 0.59 0.80 0.90  CALCIUM 9.0 8.7* 8.9 8.8* 9.0  MG  --   --  1.7  --   --    Liver Function  Tests: Recent Labs  Lab 11/02/20 1630  AST 17  ALT 12  ALKPHOS 68  BILITOT 0.4  PROT 7.0  ALBUMIN 3.1*   No results for input(s): LIPASE, AMYLASE in the last 168 hours. No results for input(s): AMMONIA in the last 168 hours. CBC: Recent Labs  Lab 10/29/20 2009 11/02/20 1630 11/03/20 0344 11/04/20 0204 11/05/20 0158  WBC 11.4* 11.4* 10.9* 9.1 8.9  NEUTROABS  --  7.6 6.6 5.6 5.5  HGB 12.1 12.4 12.0 12.0 12.1  HCT 37.6 38.7 38.9 38.3 38.8  MCV 81.2 81.0 83.3 82.2 84.2  PLT 349 343 350 326 352   Cardiac Enzymes: No results for input(s): CKTOTAL, CKMB, CKMBINDEX, TROPONINI in the last 168 hours. BNP: BNP (last 3 results) No results for input(s): BNP in the last 8760 hours.  ProBNP (last 3 results) No results for input(s): PROBNP in the last 8760 hours.  CBG: Recent Labs  Lab 11/04/20 1642 11/04/20 2205 11/05/20 0720 11/05/20 1128 11/05/20 1553  GLUCAP 154* 326* 272* 278* 299*       Signed:  Briant Cedar, MD [facility] 11/05/2020, 3:59 PM

## 2020-11-05 NOTE — Plan of Care (Signed)

## 2020-11-05 NOTE — Anesthesia Preprocedure Evaluation (Addendum)
Anesthesia Evaluation  Patient identified by MRN, date of birth, ID band Patient awake    Reviewed: NPO status , Patient's Chart, lab work & pertinent test results  Airway Mallampati: II  TM Distance: >3 FB Neck ROM: Full    Dental  (+) Teeth Intact, Dental Advisory Given   Pulmonary former smoker,    breath sounds clear to auscultation       Cardiovascular hypertension,  Rhythm:Regular Rate:Normal     Neuro/Psych    GI/Hepatic   Endo/Other  diabetes  Renal/GU      Musculoskeletal   Abdominal   Peds  Hematology   Anesthesia Other Findings   Reproductive/Obstetrics                            Anesthesia Physical Anesthesia Plan  ASA: III  Anesthesia Plan: MAC   Post-op Pain Management:    Induction: Intravenous  PONV Risk Score and Plan: Ondansetron and Propofol infusion  Airway Management Planned: Natural Airway and Simple Face Mask  Additional Equipment:   Intra-op Plan:   Post-operative Plan:   Informed Consent: I have reviewed the patients History and Physical, chart, labs and discussed the procedure including the risks, benefits and alternatives for the proposed anesthesia with the patient or authorized representative who has indicated his/her understanding and acceptance.     Dental advisory given  Plan Discussed with: CRNA and Anesthesiologist  Anesthesia Plan Comments:         Anesthesia Quick Evaluation

## 2020-11-07 ENCOUNTER — Encounter (HOSPITAL_COMMUNITY): Payer: Self-pay | Admitting: Internal Medicine

## 2020-11-08 ENCOUNTER — Encounter (HOSPITAL_COMMUNITY): Payer: Self-pay | Admitting: Cardiology

## 2020-11-15 ENCOUNTER — Encounter: Payer: Medicare HMO | Admitting: Student

## 2020-11-22 NOTE — Progress Notes (Signed)
Cardiology Office Note Date:  11/23/2020  Patient ID:  [name], [name] 1961/04/07, MRN [id number] PCP:  [name], MD  Cardiologist:  Dr. Clifton James Electrophysiologist: Dr. Elberta Fortis    Chief Complaint: ILR wound check  History of Present Illness: [name] is a 60 y.o. female with history of HTN, HLD, DM, obesity, CAD and stroke  She was [facility] to [facility] 11/02/20 with visual changes and difficult speech/stroke.found with Left PCA stroke with occipital infarction and hemorrhagic conversion concerning for embolic source.  She had TTE with LVEF 45-50%, grade I DD, WMA  severe akinesis of the left ventricular,  mid-apical apical segment and anteroseptal wall Findings suggest mid to  distal LAD infarct/scar.  TEE with very small PFO LE venous US negative for DVT and ILR was placed. Discharged 11/05/20  TODAY She is doing quite well post stroke. Speech is much better and is doing great with PT  Cardiac-wise, no CP, palpitations or cardiac awareness No SOB No dizzy spells, near syncope or syncope.  She has hadn o concerns with her loop site.  She is on Eliquis, states that the Brilinta made her very SOB and her PMD stopped the Brilinta and started her on Eliquis. This was very shortly after discharge, probably about 2 weeks now.  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 Arthritis   . CAD (coronary artery disease)    s/p Taxus DES to pLAD 9/07; ant STEMI 6/10 due to late stent thrombosis treated with thrombectomy and POBA to LAD (LHC: ant apical AK, Ef 40%, dLM 20-30%, pLAD occluded at prox edge of stent (treated with PCI), R-L collats, pRCA 50%, mRCA 30%.) ;Myoview done 07/19/11: mod ant apical and inf apical scar, no ischemia, EF 45%    . Diabetes mellitus   . Hyperlipidemia   . Hypertension   . Ischemic cardiomyopathy    echo 7/10: inf septal and  apical HK, EF 45-50%, mild LAE.    Marland Kitchen Myocardial infarction [facility]) [phone]    Past Surgical History:  Procedure Laterality Date  . BUBBLE STUDY  11/05/2020   Procedure: BUBBLE STUDY;  Surgeon: Pricilla Riffle, MD;  Location: [facility] ENDOSCOPY;  Service: Cardiovascular;;  . CHOLECYSTECTOMY    . CORONARY ANGIOPLASTY  [id number]  . LOOP RECORDER INSERTION N/A 11/05/2020   Procedure: LOOP RECORDER INSERTION;  Surgeon: Regan Lemming, MD;  Location: [facility] INVASIVE CV LAB;  Service: Cardiovascular;  Laterality: N/A;  . TEE WITHOUT CARDIOVERSION N/A 11/05/2020   Procedure: TRANSESOPHAGEAL ECHOCARDIOGRAM (TEE);  Surgeon: Pricilla Riffle, MD;  Location: [facility] ENDOSCOPY;  Service: Cardiovascular;  Laterality: N/A;  . TONSILLECTOMY    . TONSILLECTOMY     age 60  . TOTAL KNEE ARTHROPLASTY  01/23/2012   Procedure: TOTAL KNEE ARTHROPLASTY;  Surgeon: Shelda Pal, MD;  Location: [facility] ORS;  Service: Orthopedics;  Laterality: Right;    Current Outpatient Medications  Medication Sig Dispense Refill  . apixaban (ELIQUIS) 5 MG TABS tablet Take 1 tablet by mouth 2 (two) times daily.    Marland Kitchen aspirin 81 MG tablet Take 81 mg by mouth daily with breakfast.    . carvedilol (COREG) 6.25 MG tablet Take 1 tablet (6.25 mg total) by mouth 2 (two) times daily with a meal. 60 tablet 11  . cyclobenzaprine (FLEXERIL) 10 MG tablet Take 10 mg by mouth 2 (two) times daily as needed for  muscle spasms.    . insulin degludec (TRESIBA FLEXTOUCH) 200 UNIT/ML FlexTouch Pen Inject 50 Units into the skin daily.    . metFORMIN (GLUCOPHAGE) 1000 MG tablet Take 1,000 mg by mouth 2 (two) times daily.    . ONE TOUCH ULTRA TEST test strip     . ticagrelor (BRILINTA) 90 MG TABS tablet Take 1 tablet (90 mg total) by mouth 2 (two) times daily. 60 tablet 0  . traMADol (ULTRAM) 50 MG tablet Take 50 mg by mouth every 12 (twelve) hours as needed for moderate pain.    . valsartan (DIOVAN) 80 MG tablet Take 80 mg by mouth at bedtime.     No current  facility-administered medications for this visit.    Allergies:   Patient has no active allergies.   Social History:  The patient  reports that she has quit smoking. Her smoking use included cigarettes. She quit after 15.00 years of use. She quit smokeless tobacco use about 29 years ago. She reports that she does not drink alcohol and does not use drugs.   Family History:  The patient's family history includes Arthritis in an other family member; Cancer in her maternal grandmother and paternal grandmother; Colon cancer in an other family member; Diabetes in her father and another family member; Heart disease in her maternal grandfather; Lupus in her mother.  ROS:  Please see the history of present illness.    All other systems are reviewed and otherwise negative.   PHYSICAL EXAM:  VS:  BP 112/76   Pulse 82   Ht 5\' 4"  (1.626 m)   Wt 241 lb 9.6 oz (109.6 kg)   SpO2 98%   BMI 41.47 kg/m  BMI: Body mass index is 41.47 kg/m. Well nourished, well developed, in no acute distress HEENT: normocephalic, atraumatic Neck: no JVD, carotid bruits or masses Cardiac:  RRR; no significant murmurs, no rubs, or gallops Lungs:  CTA b/l, no wheezing, rhonchi or rales Abd: soft, nontender MS: no deformity or atrophy Ext: no edema Skin: warm and dry, no rash Neuro:  No gross deficits appreciated Psych: euthymic mood, full affect  ILR site: steri strips are removed without difficulty, site is well healed and stable, no tethering or discomfort   EKG:  Not done today  Device interrogation done today and reviewed by myself:  Battery is good R waves 0.55mV No device observations   11/05/2020: LE venous 01/05/2021 Summary:  BILATERAL:  - No evidence of deep vein thrombosis seen in the lower extremities,  bilaterally.  - No evidence of superficial venous thrombosis in the lower extremities,  bilaterally.  -No evidence of popliteal cyst, bilaterally.     11/05/2020: TEE IMPRESSIONS  1. LV systolic  function is mild to moderately depressed with severe  hypokinesis/akinesis of the distal inferior / distal inferoseptal walls.  2. Right ventricular systolic function is normal. The right ventricular  size is normal.  3. No left atrial/left atrial appendage thrombus was detected.  4. Very mild prolapse to P2 segment of MV . Mild mitral valve  regurgitation.  5. Aortic valve regurgitation is trivial.  6. Interatrial septum is hypermobile There is a very small PFO with  occasional large bubble seen in L side chambers It is not seen by color  doppler There are smaller bubbles seen later consistent with  intrapulmonary shunt.    10/22/20: TTE IMPRESSIONS  1. Left ventricular ejection fraction, by estimation, is 45 to 50%. The  left ventricle has mildly decreased function. The  left ventricle  demonstrates regional wall motion abnormalities (see scoring  diagram/findings for description). Left ventricular  diastolic parameters are consistent with Grade I diastolic dysfunction  (impaired relaxation). There is severe akinesis of the left ventricular,  mid-apical apical segment and anteroseptal wall. Findings suggest mid to  distal LAD infarct/scar.  2. Right ventricular systolic function is normal. The right ventricular  size is normal.  3. The mitral valve is abnormal. Mild mitral valve regurgitation.  4. The aortic valve is tricuspid. Aortic valve regurgitation is not  visualized.  5. The inferior vena cava is normal in size with greater than 50%  respiratory variability, suggesting right atrial pressure of 3 mmHg.  Comparison(s): No prior Echocardiogram.   Recent Labs: 11/02/2020: ALT 12; TSH 3.097 11/03/2020: Magnesium 1.7 11/05/2020: BUN 8; Creatinine, Ser 0.90; Hemoglobin 12.1; Platelets 352; Potassium 3.4; Sodium 139  11/03/2020: Cholesterol 144; HDL 39; LDL Cholesterol 85; Total CHOL/HDL Ratio 3.7; Triglycerides 100; VLDL 20   Estimated Creatinine Clearance: 78.5 mL/min (by  C-G formula based on SCr of 0.9 mg/dL).   Wt Readings from Last 3 Encounters:  11/23/20 241 lb 9.6 oz (109.6 kg)  11/02/20 238 lb 5.1 oz (108.1 kg)  10/29/20 249 lb 1.9 oz (113 kg)     Other studies reviewed: Additional studies/records reviewed today include: summarized above  ASSESSMENT AND PLAN:  1. Cryptogenic stroke     Sp ILR     Site is well healed     No AFib to date  I have reached out to neurology NP and covering MD today (Dr. Pearlean Brownie is out of town) noting that her MRI had some hemorrhagic conversion and the change from Brilinta to eliquis I confirmed with the patient's pharmacy the eliquis was RX 11/10/19 Delila with neurology was going to reach out to the primary MD and follow up on this and their recommendations going forward.   2. CAD     No anginal symptoms     Has follow up with Dr. Sanjuana Kava annually  3. HTN     Looks good  4. HLD     Not addressed today  5. ICM     LVEF and WMA known for her     On BB/ARB     No symptoms or exam findings to suggest volume OL     C/w Dr. Clifton James   Disposition: F/u with   Current medicines are reviewed at length with the patient today.  The patient did not have any concerns regarding medicines.  Norma Fredrickson, PA-C 11/23/2020 3:55 PM     [facility] [address] [place] [address] [phone] (office)  [phone] (fax)

## 2020-11-23 ENCOUNTER — Ambulatory Visit (INDEPENDENT_AMBULATORY_CARE_PROVIDER_SITE_OTHER): Payer: Medicare HMO | Admitting: Physician Assistant

## 2020-11-23 ENCOUNTER — Encounter: Payer: Self-pay | Admitting: Physician Assistant

## 2020-11-23 ENCOUNTER — Other Ambulatory Visit: Payer: Self-pay

## 2020-11-23 VITALS — BP 112/76 | HR 82 | Ht 64.0 in | Wt 241.6 lb

## 2020-11-23 DIAGNOSIS — Z5189 Encounter for other specified aftercare: Secondary | ICD-10-CM | POA: Diagnosis not present

## 2020-11-23 DIAGNOSIS — Z4509 Encounter for adjustment and management of other cardiac device: Secondary | ICD-10-CM | POA: Diagnosis not present

## 2020-11-23 DIAGNOSIS — I6389 Other cerebral infarction: Secondary | ICD-10-CM

## 2020-11-23 DIAGNOSIS — I2510 Atherosclerotic heart disease of native coronary artery without angina pectoris: Secondary | ICD-10-CM | POA: Diagnosis not present

## 2020-11-23 DIAGNOSIS — I10 Essential (primary) hypertension: Secondary | ICD-10-CM | POA: Diagnosis not present

## 2020-11-23 LAB — CUP PACEART INCLINIC DEVICE CHECK
Date Time Interrogation Session: 20220419162045
Implantable Pulse Generator Implant Date: 20220401

## 2020-11-23 NOTE — Patient Instructions (Signed)
Medication Instructions:    Your physician recommends that you continue on your current medications as directed. Please refer to the Current Medication list given to you today.  *If you need a refill on your cardiac medications before your next appointment, please call your pharmacy*   Lab Work: NONE ORDERED  TODAY   If you have labs (blood work) drawn today and your tests are completely normal, you will receive your results only by: MyChart Message (if you have MyChart) OR A paper copy in the mail If you have any lab test that is abnormal or we need to change your treatment, we will call you to review the results.   Testing/Procedures: NONE ORDERED  TODAY     Follow-Up: At [facility], you and your health needs are our priority.  As part of our continuing mission to provide you with exceptional heart care, we have created designated Provider Care Teams.  These Care Teams include your primary Cardiologist (physician) and Advanced Practice Providers (APPs -  Physician Assistants and Nurse Practitioners) who all work together to provide you with the care you need, when you need it.  We recommend signing up for the patient portal called "MyChart".  Sign up information is provided on this After Visit Summary.  MyChart is used to connect with patients for Virtual Visits (Telemedicine).  Patients are able to view lab/test results, encounter notes, upcoming appointments, etc.  Non-urgent messages can be sent to your provider as well.   To learn more about what you can do with MyChart, go to [email or web address].com.    Your next appointment:  .CONTACT [facility] [phone] AS NEEDED FOR  ANY CARDIAC RELATED SYMPTOMS   

## 2020-12-28 ENCOUNTER — Ambulatory Visit (INDEPENDENT_AMBULATORY_CARE_PROVIDER_SITE_OTHER): Payer: Medicare HMO

## 2020-12-28 DIAGNOSIS — I6300 Cerebral infarction due to thrombosis of unspecified precerebral artery: Secondary | ICD-10-CM | POA: Diagnosis not present

## 2020-12-30 LAB — CUP PACEART REMOTE DEVICE CHECK
Date Time Interrogation Session: 20220523213852
Implantable Pulse Generator Implant Date: 20220401

## 2021-01-12 LAB — COLOGUARD

## 2021-01-19 NOTE — Progress Notes (Signed)
[facility] Summary Report / Loop Recorder 

## 2021-01-31 ENCOUNTER — Encounter: Payer: Self-pay | Admitting: Neurology

## 2021-01-31 ENCOUNTER — Ambulatory Visit (INDEPENDENT_AMBULATORY_CARE_PROVIDER_SITE_OTHER): Payer: Medicare HMO | Admitting: Neurology

## 2021-01-31 ENCOUNTER — Ambulatory Visit (INDEPENDENT_AMBULATORY_CARE_PROVIDER_SITE_OTHER): Payer: Medicare HMO

## 2021-01-31 ENCOUNTER — Other Ambulatory Visit: Payer: Self-pay

## 2021-01-31 VITALS — BP 133/77 | HR 81 | Ht 64.0 in | Wt 230.4 lb

## 2021-01-31 DIAGNOSIS — I63531 Cerebral infarction due to unspecified occlusion or stenosis of right posterior cerebral artery: Secondary | ICD-10-CM | POA: Diagnosis not present

## 2021-01-31 DIAGNOSIS — H53461 Homonymous bilateral field defects, right side: Secondary | ICD-10-CM

## 2021-01-31 DIAGNOSIS — I639 Cerebral infarction, unspecified: Secondary | ICD-10-CM | POA: Diagnosis not present

## 2021-01-31 DIAGNOSIS — I6300 Cerebral infarction due to thrombosis of unspecified precerebral artery: Secondary | ICD-10-CM | POA: Diagnosis not present

## 2021-01-31 LAB — CUP PACEART REMOTE DEVICE CHECK
Date Time Interrogation Session: 20220625213903
Implantable Pulse Generator Implant Date: 20220401

## 2021-01-31 NOTE — Progress Notes (Addendum)
[facility] [address] [place]. [place] [address] [phone]       OFFICE FOLLOW-UP NOTE  [name] Date of Birth:  Oct 07, 1960 Medical Record Number:  [id number]   HPI: [name] is a 60 year old pleasant African-American lady seen today for initial office follow-up visit following hospital admission for stroke in March 2022.  History is obtained from the patient and review of electronic medical records and I personally reviewed imaging films in PACS. She has past medical history of diabetes, hypertension, hyperlipidemia, obesity, ischemic cardiomyopathy who presented with 2 to 3-day history of subacute peripheral vision loss on the right.  She also had some trouble reading.  CT scan of the head and CT angiogram showed is left posterior cerebral artery infarct with some petechial hemorrhage.  CT angiogram showed left P2/P3 segment stenosis versus occlusion.  There is calcified plaque noted within intracranial internal carotid arteries but no significant stenosis.  MRI scan of the brain confirmed acute to early subacute left occipital infarct with some hemorrhagic transformation.  Transthoracic echo done recently on 10/22/2020 had shown ejection fraction of 45 to 50% with mildly decreased LV function but no clot.  TEE was obtained on 11/05/2020 which showed LV systolic function to be mild to moderately depressed with hypokinesis of kinesis of distal inferior and inferior septal walls but no definite clot.  There is small PFO noted but lower extremity venous Dopplers were negative for DVT.  LDL cholesterol is 85 mg percent and hemoglobin A1c was elevated at 10.8.  Patient was started on dual antiplatelet therapy and states that she is done well.  She still has some right-sided peripheral vision loss but she feels it is improving.  She has an upcoming appointment to see an eye doctor to see if she can drive or not.  She is working on eating a healthy diet and trying to lose  weight and is lost 4 pounds so far.  She is also been active and doing water aerobics 4 to 5 days a week.  She has had no recurrent stroke or TIA symptoms.  States her sugars under better control and she knows she wants to bring her hemoglobin A1c down quite a bit.  Blood pressures are well controlled and today it is 133/77.  No new complaints.  She has not been driving.  She has quit smoking 30 years ago. ROS:   14 system review of systems is positive for vision loss, speech difficulties, numbness and all other systems negative  PMH:  Past Medical History:  Diagnosis Date   Arthritis    CAD (coronary artery disease)    s/p Taxus DES to pLAD 9/07; ant STEMI 6/10 due to late stent thrombosis treated with thrombectomy and POBA to LAD (LHC: ant apical AK, Ef 40%, dLM 20-30%, pLAD occluded at prox edge of stent (treated with PCI), R-L collats, pRCA 50%, mRCA 30%.) ;Myoview done 07/19/11: mod ant apical and inf apical scar, no ischemia, EF 45%     CVA (cerebral vascular accident) (HCC)    Diabetes mellitus    Hyperlipidemia    Hypertension    Ischemic cardiomyopathy    echo 7/10: inf septal and apical HK, EF 45-50%, mild LAE.     Myocardial infarction [facility]) 2007,2010    Social History:  Social History   Socioeconomic History   Marital status: Single    Spouse name: Not on file   Number of children: 0   Years of education: college  Highest education level: Master's degree (e.g., MA, MS, MEng, MEd, MSW, MBA)  Occupational History   Occupation: Retired  Tobacco Use   Smoking status: Former    Years: 15.00    Pack years: 0.00    Types: Cigarettes   Smokeless tobacco: Former    Quit date: 01/18/1991   Tobacco comments:    QUIT 21 YEARS AGO  Substance and Sexual Activity   Alcohol use: No   Drug use: No   Sexual activity: Not on file  Other Topics Concern   Not on file  Social History Narrative   Lives alone.   Right-handed.   No daily caffeine.   Social Determinants of Health    Financial Resource Strain: Not on file  Food Insecurity: Not on file  Transportation Needs: Not on file  Physical Activity: Not on file  Stress: Not on file  Social Connections: Not on file  Intimate Partner Violence: Not on file    Medications:   Current Outpatient Medications on File Prior to Visit  Medication Sig Dispense Refill   aspirin 81 MG tablet Take 81 mg by mouth daily with breakfast.     b complex vitamins capsule Take 1 capsule by mouth daily.     carvedilol (COREG) 6.25 MG tablet Take 1 tablet (6.25 mg total) by mouth 2 (two) times daily with a meal. 60 tablet 11   Cholecalciferol (VITAMIN D-3) 125 MCG (5000 UT) TABS Take 1 tablet by mouth daily.     clopidogrel (PLAVIX) 75 MG tablet Take 75 mg by mouth daily.     cyclobenzaprine (FLEXERIL) 10 MG tablet Take 10 mg by mouth 2 (two) times daily as needed for muscle spasms.     glipiZIDE (GLUCOTROL) 5 MG tablet Take 1 tablet by mouth in the morning and at bedtime.     metFORMIN (GLUCOPHAGE) 1000 MG tablet Take 1,000 mg by mouth 2 (two) times daily.     ONE TOUCH ULTRA TEST test strip      Red Yeast Rice Extract (RED YEAST RICE PO) Take 1 tablet by mouth daily.     traMADol (ULTRAM) 50 MG tablet Take 50 mg by mouth every 12 (twelve) hours as needed for moderate pain.     UNABLE TO FIND Take 1 tablet by mouth daily. Med Name: Natto-serrazime     valsartan (DIOVAN) 80 MG tablet Take 80 mg by mouth at bedtime.     No current facility-administered medications on file prior to visit.    Allergies:  No Active Allergies  Physical Exam General: Obese middle-age African-American lady, seated, in no evident distress Head: head normocephalic and atraumatic.  Neck: supple with no carotid or supraclavicular bruits Cardiovascular: regular rate and rhythm, no murmurs Musculoskeletal: no deformity Skin:  no rash/petichiae Vascular:  Normal pulses all extremities Vitals:   01/31/21 1104  BP: 133/77  Pulse: 81   Neurologic  Exam Mental Status: Awake and fully alert. Oriented to place and time. Recent and remote memory intact. Attention span, concentration and fund of knowledge appropriate. Mood and affect appropriate.  Mini-Mental status exam she scored 30/30 without any deficits.  She was able to name only 8 animals which can walk on 4 legs. Cranial Nerves: Fundoscopic exam reveals sharp disc margins. Pupils equal, briskly reactive to light. Extraocular movements full without nystagmus. Visual fields show dense right homonymous hemianopsia l to confrontation. Hearing intact. Facial sensation intact. Face, tongue, palate moves normally and symmetrically.  Motor: Normal bulk and tone. Normal strength in  all tested extremity muscles. Sensory.: intact to touch ,pinprick .position and vibratory sensation.  Coordination: Rapid alternating movements normal in all extremities. Finger-to-nose and heel-to-shin performed accurately bilaterally. Gait and Station: Arises from chair without difficulty. Stance is normal. Gait demonstrates normal stride length and balance . Able to heel, toe and tandem walk with mild  difficulty.  Reflexes: 1+ and symmetric. Toes downgoing.   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  Vascular risk factors of diabetes, hypertension, hyperlipidemia, ischemic cardiomyopathy and obesity.     PLAN: I had a long d/w patient about her  recent cryptogenic stroke,and peripheral vision loss, risk for recurrent stroke/TIAs, personally independently reviewed imaging studies and stroke evaluation results and answered questions.Continue aspirin 81 mg daily but consider discontinuing Plavix as it has been more than 10 years since her drug-coated stent MI after discussion with the cardiologist for secondary stroke prevention and maintain strict control of hypertension with blood pressure  goal below 130/90, diabetes with hemoglobin A1c goal below 6.5% and lipids with LDL cholesterol goal below 70 mg/dL. I also advised the patient to eat a healthy diet with plenty of whole grains, cereals, fruits and vegetables, exercise regularly and maintain ideal body weight.  She was advised not to drive due to her right-sided peripheral vision loss.  She may also consider possible participation in the [place] trial for stroke prevention and will be given information to review and decide.  followup in the future with me in 6 months or call earlier if necessary. Greater than 50% of time during this 25 minute visit was spent on counseling,explanation of diagnosis, planning of further management, discussion with patient and family and coordination of care Delia Heady, MD Note: This document was prepared with digital dictation and possible smart phrase technology. Any transcriptional errors that result from this process are unintentional

## 2021-01-31 NOTE — Patient Instructions (Signed)
I had a long d/w patient about her  recent cryptogenic stroke,and peripheral vision loss, risk for recurrent stroke/TIAs, personally independently reviewed imaging studies and stroke evaluation results and answered questions.Continue aspirin 81 mg daily but consider discontinuing Plavix as it has been more than 10 years since her drug-coated stent MI after discussion with the cardiologist for secondary stroke prevention and maintain strict control of hypertension with blood pressure goal below 130/90, diabetes with hemoglobin A1c goal below 6.5% and lipids with LDL cholesterol goal below 70 mg/dL. I also advised the patient to eat a healthy diet with plenty of whole grains, cereals, fruits and vegetables, exercise regularly and maintain ideal body weight.  She was advised not to drive due to her right-sided peripheral vision loss.  She may also consider possible participation in the [place] trial for stroke prevention and will be given information to review and decide.  followup in the future with me in 6 months or call earlier if necessary. Stroke Prevention Some medical conditions and behaviors are associated with a higher chance of having a stroke. You can help prevent a stroke by making nutrition, lifestyle,and other changes, including managing any medical conditions you may have. What nutrition changes can be made?  Eat healthy foods. You can do this by: Choosing foods high in fiber, such as fresh fruits and vegetables and whole grains. Eating at least 5 or more servings of fruits and vegetables a day. Try to fill half of your plate at each meal with fruits and vegetables. Choosing lean protein foods, such as lean cuts of meat, poultry without skin, fish, tofu, beans, and nuts. Eating low-fat dairy products. Avoiding foods that are high in salt (sodium). This can help lower blood pressure. Avoiding foods that have saturated fat, trans fat, and cholesterol. This can help prevent high  cholesterol. Avoiding processed and premade foods. Follow your health care provider's specific guidelines for losing weight, controlling high blood pressure (hypertension), lowering high cholesterol, and managing diabetes. These may include: Reducing your daily calorie intake. Limiting your daily sodium intake to 1,500 milligrams (mg). Using only healthy fats for cooking, such as olive oil, canola oil, or sunflower oil. Counting your daily carbohydrate intake. What lifestyle changes can be made? Maintain a healthy weight. Talk to your health care provider about your ideal weight. Get at least 30 minutes of moderate physical activity at least 5 days a week. Moderate activity includes brisk walking, biking, and swimming. Do not use any products that contain nicotine or tobacco, such as cigarettes and e-cigarettes. If you need help quitting, ask your health care provider. It may also be helpful to avoid exposure to secondhand smoke. Limit alcohol intake to no more than 1 drink a day for nonpregnant women and 2 drinks a day for men. One drink equals 12 oz of beer, 5 oz of wine, or 1 oz of hard liquor. Stop any illegal drug use. Avoid taking birth control pills. Talk to your health care provider about the risks of taking birth control pills if: You are over 76 years old. You smoke. You get migraines. You have ever had a blood clot. What other changes can be made? Manage your cholesterol levels. Eating a healthy diet is important for preventing high cholesterol. If cholesterol cannot be managed through diet alone, you may also need to take medicines. Take any prescribed medicines to control your cholesterol as told by your health care provider. Manage your diabetes. Eating a healthy diet and exercising regularly are important parts of  managing your blood sugar. If your blood sugar cannot be managed through diet and exercise, you may need to take medicines. Take any prescribed medicines to control  your diabetes as told by your health care provider. Control your hypertension. To reduce your risk of stroke, try to keep your blood pressure below 130/80. Eating a healthy diet and exercising regularly are an important part of controlling your blood pressure. If your blood pressure cannot be managed through diet and exercise, you may need to take medicines. Take any prescribed medicines to control hypertension as told by your health care provider. Ask your health care provider if you should monitor your blood pressure at home. Have your blood pressure checked every year, even if your blood pressure is normal. Blood pressure increases with age and some medical conditions. Get evaluated for sleep disorders (sleep apnea). Talk to your health care provider about getting a sleep evaluation if you snore a lot or have excessive sleepiness. Take over-the-counter and prescription medicines only as told by your health care provider. Aspirin or blood thinners (antiplatelets or anticoagulants) may be recommended to reduce your risk of forming blood clots that can lead to stroke. Make sure that any other medical conditions you have, such as atrial fibrillation or atherosclerosis, are managed. What are the warning signs of a stroke? The warning signs of a stroke can be easily remembered as BEFAST. B is for balance. Signs include: Dizziness. Loss of balance or coordination. Sudden trouble walking. E is for eyes. Signs include: A sudden change in vision. Trouble seeing. F is for face. Signs include: Sudden weakness or numbness of the face. The face or eyelid drooping to one side. A is for arms. Signs include: Sudden weakness or numbness of the arm, usually on one side of the body. S is for speech. Signs include: Trouble speaking (aphasia). Trouble understanding. T is for time. These symptoms may represent a serious problem that is an emergency. Do not wait to see if the symptoms will go away. Get  medical help right away. Call your local emergency services (911 in the [place] Do not drive yourself to the hospital. Other signs of stroke may include: A sudden, severe headache with no known cause. Nausea or vomiting. Seizure. Where to find more information For more information, visit: American Stroke Association: www.strokeassociation.org National Stroke Association: www.stroke.org Summary You can prevent a stroke by eating healthy, exercising, not smoking, limiting alcohol intake, and managing any medical conditions you may have. Do not use any products that contain nicotine or tobacco, such as cigarettes and e-cigarettes. If you need help quitting, ask your health care provider. It may also be helpful to avoid exposure to secondhand smoke. Remember BEFAST for warning signs of stroke. Get help right away if you or a loved one has any of these signs. This information is not intended to replace advice given to you by your health care provider. Make sure you discuss any questions you have with your healthcare provider. Document Revised: 07/06/2017 Document Reviewed: 08/29/2016 Elsevier Patient Education  2021 [employer].

## 2021-02-21 NOTE — Progress Notes (Signed)
[facility] Summary Report / Loop Recorder 

## 2021-03-05 LAB — CUP PACEART REMOTE DEVICE CHECK
Date Time Interrogation Session: 20220728214230
Implantable Pulse Generator Implant Date: 20220401

## 2021-03-07 ENCOUNTER — Ambulatory Visit (INDEPENDENT_AMBULATORY_CARE_PROVIDER_SITE_OTHER): Payer: Medicare HMO

## 2021-03-07 DIAGNOSIS — I6300 Cerebral infarction due to thrombosis of unspecified precerebral artery: Secondary | ICD-10-CM | POA: Diagnosis not present

## 2021-03-30 NOTE — Progress Notes (Signed)
[facility] Summary Report / Loop Recorder 

## 2021-04-12 ENCOUNTER — Ambulatory Visit (INDEPENDENT_AMBULATORY_CARE_PROVIDER_SITE_OTHER): Payer: Medicare HMO

## 2021-04-12 DIAGNOSIS — I6300 Cerebral infarction due to thrombosis of unspecified precerebral artery: Secondary | ICD-10-CM

## 2021-04-12 LAB — CUP PACEART REMOTE DEVICE CHECK
Date Time Interrogation Session: 20220830214201
Implantable Pulse Generator Implant Date: 20220401

## 2021-04-21 NOTE — Progress Notes (Signed)
[facility] Summary Report / Loop Recorder 

## 2021-05-12 LAB — CUP PACEART REMOTE DEVICE CHECK
Date Time Interrogation Session: 20221002214135
Implantable Pulse Generator Implant Date: 20220401

## 2021-05-16 ENCOUNTER — Ambulatory Visit (INDEPENDENT_AMBULATORY_CARE_PROVIDER_SITE_OTHER): Payer: Medicare HMO

## 2021-05-16 DIAGNOSIS — I6300 Cerebral infarction due to thrombosis of unspecified precerebral artery: Secondary | ICD-10-CM

## 2021-05-25 NOTE — Progress Notes (Signed)
[facility] Summary Report / Loop Recorder 

## 2021-06-16 LAB — CUP PACEART REMOTE DEVICE CHECK
Date Time Interrogation Session: 20221104213928
Implantable Pulse Generator Implant Date: 20220401

## 2021-06-20 ENCOUNTER — Ambulatory Visit (INDEPENDENT_AMBULATORY_CARE_PROVIDER_SITE_OTHER): Payer: Medicare HMO

## 2021-06-20 DIAGNOSIS — I6300 Cerebral infarction due to thrombosis of unspecified precerebral artery: Secondary | ICD-10-CM

## 2021-06-28 NOTE — Progress Notes (Signed)
[facility] Summary Report / Loop Recorder 

## 2021-07-05 ENCOUNTER — Other Ambulatory Visit (HOSPITAL_COMMUNITY): Payer: Self-pay

## 2021-07-05 MED ORDER — OZEMPIC (2 MG/DOSE) 8 MG/3ML ~~LOC~~ SOPN
2.0000 mg | PEN_INJECTOR | SUBCUTANEOUS | 5 refills | Status: AC
  Filled 2021-07-05: qty 3, 28d supply, fill #0
  Filled 2021-09-14: qty 3, 28d supply, fill #1
  Filled 2021-10-17: qty 3, 28d supply, fill #2
  Filled 2021-11-16: qty 3, 28d supply, fill #3
  Filled 2021-12-23: qty 3, 28d supply, fill #4

## 2021-07-07 LAB — COLOGUARD: COLOGUARD: NEGATIVE

## 2021-07-25 ENCOUNTER — Ambulatory Visit (INDEPENDENT_AMBULATORY_CARE_PROVIDER_SITE_OTHER): Payer: Medicare HMO

## 2021-07-25 DIAGNOSIS — I6300 Cerebral infarction due to thrombosis of unspecified precerebral artery: Secondary | ICD-10-CM

## 2021-07-25 LAB — CUP PACEART REMOTE DEVICE CHECK
Date Time Interrogation Session: 20221217231101
Implantable Pulse Generator Implant Date: 20220401

## 2021-08-04 NOTE — Progress Notes (Signed)
[facility] Summary Report / Loop Recorder 

## 2021-08-29 ENCOUNTER — Ambulatory Visit (INDEPENDENT_AMBULATORY_CARE_PROVIDER_SITE_OTHER): Payer: Medicare HMO

## 2021-08-29 DIAGNOSIS — I6300 Cerebral infarction due to thrombosis of unspecified precerebral artery: Secondary | ICD-10-CM

## 2021-08-29 LAB — CUP PACEART REMOTE DEVICE CHECK
Date Time Interrogation Session: 20230122230642
Implantable Pulse Generator Implant Date: 20220401

## 2021-09-09 NOTE — Progress Notes (Signed)
[facility] Summary Report / Loop Recorder 

## 2021-09-13 NOTE — Progress Notes (Signed)
Chief Complaint  Patient presents with   Follow-up    CAD    History of Present Illness: 61 yo female with history of hyperlipidemia, diabetes mellitus, and coronary artery disease who is here today for follow up. She had a Taxus drug eluting stent placed in the LAD in 2007. She was admitted to [facility] June 2010 with an acute anterior STEMI and was found to have very late thrombosis of the Taxus stent in the proximal LAD. This was treated with balloon angioplasty. No new stents were placed. Her cardiac cath showed distal left main 20-30%, proximal LAD occluded at the proximal edge of the old stent, 50% proximal RCA, 30% mid RCA stenosis. Echo June 2010 with inferior, septal and apical hypokinesis, LVEF 45-50%. Stress myoview May 2015 with no ischemia, LVEF=52%. She was admitted to [facility] in March 2022 with a stroke. Echo March 2022 with LVEF=45-50% with akinesis of the apex and anteroseptal walls. Mild mitral regurgitation. TEE without evidence of intracardiac thrombus. Very small PFO noted. A loop recorder was placed. She was treated briefly with Brilinta but did not tolerate this. She is now on ASA and Plavix. She is intolerant of statins and has not wished to consider PCSK9 inh therapy due to cost and lack of insurance coverage.   She is here today for follow up. The patient denies any chest pain, dyspnea, palpitations, lower extremity edema, orthopnea, PND, dizziness, near syncope or syncope.   Primary Care Physician: [name], MD  Past Medical History:  Diagnosis Date   Arthritis    CAD (coronary artery disease)    s/p Taxus DES to pLAD 9/07; ant STEMI 6/10 due to late stent thrombosis treated with thrombectomy and POBA to LAD (LHC: ant apical AK, Ef 40%, dLM 20-30%, pLAD occluded at prox edge of stent (treated with PCI), R-L collats, pRCA 50%, mRCA 30%.) ;Myoview done 07/19/11: mod ant apical and inf apical scar, no ischemia, EF 45%     CVA (cerebral vascular accident)  (HCC)    Diabetes mellitus    Hyperlipidemia    Hypertension    Ischemic cardiomyopathy    echo 7/10: inf septal and apical HK, EF 45-50%, mild LAE.     Myocardial infarction [facility]) [phone]    Past Surgical History:  Procedure Laterality Date   BUBBLE STUDY  11/05/2020   Procedure: BUBBLE STUDY;  Surgeon: Pricilla Riffle, MD;  Location: [facility] ENDOSCOPY;  Service: Cardiovascular;;   CHOLECYSTECTOMY     CORONARY ANGIOPLASTY  [phone]   LOOP RECORDER INSERTION N/A 11/05/2020   Procedure: LOOP RECORDER INSERTION;  Surgeon: Regan Lemming, MD;  Location: [facility] INVASIVE CV LAB;  Service: Cardiovascular;  Laterality: N/A;   TEE WITHOUT CARDIOVERSION N/A 11/05/2020   Procedure: TRANSESOPHAGEAL ECHOCARDIOGRAM (TEE);  Surgeon: Pricilla Riffle, MD;  Location: [facility] ENDOSCOPY;  Service: Cardiovascular;  Laterality: N/A;   TONSILLECTOMY     TONSILLECTOMY     age 77   TOTAL KNEE ARTHROPLASTY  01/23/2012   Procedure: TOTAL KNEE ARTHROPLASTY;  Surgeon: Shelda Pal, MD;  Location: [facility] ORS;  Service: Orthopedics;  Laterality: Right;    Current Outpatient Medications  Medication Sig Dispense Refill   aspirin 81 MG tablet Take 81 mg by mouth daily with breakfast.     b complex vitamins capsule Take 1 capsule by mouth daily.     carvedilol (COREG) 12.5 MG tablet Take 1 tablet (12.5 mg total) by mouth 2 (two) times daily. 180 tablet 3  Cholecalciferol (VITAMIN D-3) 125 MCG (5000 UT) TABS Take 1 tablet by mouth daily.     clopidogrel (PLAVIX) 75 MG tablet Take 75 mg by mouth daily.     cyclobenzaprine (FLEXERIL) 10 MG tablet Take 10 mg by mouth 2 (two) times daily as needed for muscle spasms.     glipiZIDE (GLUCOTROL) 5 MG tablet Take 1 tablet by mouth in the morning and at bedtime.     metFORMIN (GLUCOPHAGE) 1000 MG tablet Take 1,000 mg by mouth 2 (two) times daily.     ONE TOUCH ULTRA TEST test strip      Red Yeast Rice Extract (RED YEAST RICE PO) Take 1 tablet by mouth daily.     Semaglutide, 2 MG/DOSE,  (OZEMPIC, 2 MG/DOSE,) 8 MG/3ML SOPN Inject 2 mg into the skin once a week. 3 mL 5   traMADol (ULTRAM) 50 MG tablet Take 50 mg by mouth every 12 (twelve) hours as needed for moderate pain.     UNABLE TO FIND Take 1 tablet by mouth daily. Med Name: Natto-serrazime     valsartan (DIOVAN) 80 MG tablet Take 80 mg by mouth at bedtime.     No current facility-administered medications for this visit.    No Active Allergies   Social History   Socioeconomic History   Marital status: Single    Spouse name: Not on file   Number of children: 0   Years of education: college   Highest education level: Master's degree (e.g., MA, MS, MEng, MEd, MSW, MBA)  Occupational History   Occupation: Retired  Tobacco Use   Smoking status: Former    Years: 15.00    Types: Cigarettes   Smokeless tobacco: Former    Quit date: 01/18/1991   Tobacco comments:    QUIT 21 YEARS AGO  Substance and Sexual Activity   Alcohol use: No   Drug use: No   Sexual activity: Not on file  Other Topics Concern   Not on file  Social History Narrative   Lives alone.   Right-handed.   No daily caffeine.   Social Determinants of Health   Financial Resource Strain: Not on file  Food Insecurity: Not on file  Transportation Needs: Not on file  Physical Activity: Not on file  Stress: Not on file  Social Connections: Not on file  Intimate Partner Violence: Not on file    Family History  Problem Relation Age of Onset   Lupus Mother    Diabetes Father    Cancer Maternal Grandmother    Heart disease Maternal Grandfather    Cancer Paternal Grandmother    Arthritis Other    Colon cancer Other    Diabetes Other     Review of Systems:  As stated in the HPI and otherwise negative.   BP (!) 152/80    Pulse 79    Ht 5\' 4"  (1.626 m)    Wt 226 lb 6.4 oz (102.7 kg)    SpO2 97%    BMI 38.86 kg/m   Physical Examination: General: Well developed, well nourished, NAD  HEENT: OP clear, mucus membranes moist  SKIN: warm,  dry. No rashes. Neuro: No focal deficits  Musculoskeletal: Muscle strength 5/5 all ext  Psychiatric: Mood and affect normal  Neck: No JVD, no carotid bruits, no thyromegaly, no lymphadenopathy.  Lungs:Clear bilaterally, no wheezes, rhonci, crackles Cardiovascular: Regular rate and rhythm. No murmurs, gallops or rubs. Abdomen:Soft. Bowel sounds present. Non-tender.  Extremities: No lower extremity edema. Pulses are 2 +  in the bilateral DP/PT.  EKG:  EKG is not ordered today. The ekg ordered today demonstrates sinus  Echo march 2022:  1. Left ventricular ejection fraction, by estimation, is 45 to 50%. The  left ventricle has mildly decreased function. The left ventricle  demonstrates regional wall motion abnormalities (see scoring  diagram/findings for description). Left ventricular  diastolic parameters are consistent with Grade I diastolic dysfunction  (impaired relaxation). There is severe akinesis of the left ventricular,  mid-apical apical segment and anteroseptal wall. Findings suggest mid to  distal LAD infarct/scar.   2. Right ventricular systolic function is normal. The right ventricular  size is normal.   3. The mitral valve is abnormal. Mild mitral valve regurgitation.   4. The aortic valve is tricuspid. Aortic valve regurgitation is not  visualized.   5. The inferior vena cava is normal in size with greater than 50%  respiratory variability, suggesting right atrial pressure of 3 mmHg.   Lipid Profile: Lipids: Followed in primary care.   Recent Labs: 11/02/2020: ALT 12; TSH 3.097 11/03/2020: Magnesium 1.7 11/05/2020: BUN 8; Creatinine, Ser 0.90; Hemoglobin 12.1; Platelets 352; Potassium 3.4; Sodium 139     Wt Readings from Last 3 Encounters:  09/14/21 226 lb 6.4 oz (102.7 kg)  01/31/21 230 lb 6.4 oz (104.5 kg)  11/23/20 241 lb 9.6 oz (109.6 kg)      Assessment and Plan:   1. CAD without angina: No chest pain. Will continue DAPT with ASA and Plavix given the prior  late thrombosis of the Taxus LAD stent. Continue beta blocker. She is statin intolerant and has not wished to try Repatha in the past but is willing to consider now.   2. HTN: BP is elevated today. Will increase Coreg to 12.5 mg BID. No changes  3. HLD: She is intolerant of statins. Lipids followed in primary care. LDL of 85 last year. Will repeat lipids today. We have discussed starting PCSK9-inh therapy. She is willing to be seen in the lipid clinic to discuss. I will arrange this pending her lipid results.   4. Ischemic cardiomyopathy: LVEF around 50%. Continue beta blocker and ARB  5. History of stroke: Followed in Neurology. No cardiac source of embolus. Loop recorder in place. Continue ASA/Plavix  Current medicines are reviewed at length with the patient today.  The patient does not have concerns regarding medicines.  The following changes have been made:  no change  Labs/ tests ordered today include:   Orders Placed This Encounter  Procedures   Lipid Profile   EKG 12-Lead   Disposition:   F/U with me in 12 months  Signed, Verne Carrow, MD 09/14/2021 2:25 PM    [facility] [address] [place], [place], [place]  [address] Phone: [phone]; Fax: [phone]

## 2021-09-14 ENCOUNTER — Other Ambulatory Visit: Payer: Self-pay

## 2021-09-14 ENCOUNTER — Encounter: Payer: Self-pay | Admitting: Cardiovascular Disease

## 2021-09-14 ENCOUNTER — Other Ambulatory Visit (HOSPITAL_COMMUNITY): Payer: Self-pay

## 2021-09-14 ENCOUNTER — Ambulatory Visit: Payer: Medicare PPO | Admitting: Cardiovascular Disease

## 2021-09-14 VITALS — BP 152/80 | HR 79 | Ht 64.0 in | Wt 226.4 lb

## 2021-09-14 DIAGNOSIS — I2510 Atherosclerotic heart disease of native coronary artery without angina pectoris: Secondary | ICD-10-CM | POA: Diagnosis not present

## 2021-09-14 DIAGNOSIS — I10 Essential (primary) hypertension: Secondary | ICD-10-CM

## 2021-09-14 DIAGNOSIS — I255 Ischemic cardiomyopathy: Secondary | ICD-10-CM

## 2021-09-14 DIAGNOSIS — E782 Mixed hyperlipidemia: Secondary | ICD-10-CM

## 2021-09-14 LAB — LIPID PANEL
Chol/HDL Ratio: 3.8 ratio (ref 0.0–4.4)
Cholesterol, Total: 186 mg/dL (ref 100–199)
HDL: 49 mg/dL (ref >39)
LDL Chol Calc (NIH): 124 mg/dL — ABNORMAL HIGH (ref 0–99)
Triglycerides: 69 mg/dL (ref 0–149)
VLDL Cholesterol Cal: 13 mg/dL (ref 5–40)

## 2021-09-14 MED ORDER — CARVEDILOL 12.5 MG PO TABS: 12.5000 mg | ORAL_TABLET | Freq: Two times a day (BID) | ORAL | 3 refills | Status: DC

## 2021-09-14 NOTE — Patient Instructions (Signed)
Medication Instructions:  Your physician has recommended you make the following change in your medication:  1.) increase carvedilol (Coreg) to 12.5 mg one tablet twice daily  *If you need a refill on your cardiac medications before your next appointment, please call your pharmacy*   Lab Work: Today: Lipids  If you have labs (blood work) drawn today and your tests are completely normal, you will receive your results only by: MyChart Message (if you have MyChart) OR A paper copy in the mail If you have any lab test that is abnormal or we need to change your treatment, we will call you to review the results.   Testing/Procedures: none   Follow-Up: At [facility], you and your health needs are our priority.  As part of our continuing mission to provide you with exceptional heart care, we have created designated Provider Care Teams.  These Care Teams include your primary Cardiologist (physician) and Advanced Practice Providers (APPs -  Physician Assistants and Nurse Practitioners) who all work together to provide you with the care you need, when you need it.  We recommend signing up for the patient portal called "MyChart".  Sign up information is provided on this After Visit Summary.  MyChart is used to connect with patients for Virtual Visits (Telemedicine).  Patients are able to view lab/test results, encounter notes, upcoming appointments, etc.  Non-urgent messages can be sent to your provider as well.   To learn more about what you can do with MyChart, go to [email or web address].    Your next appointment:   12 month(s)  The format for your next appointment:   In Person  Provider:   Verne Carrow, MD     Other Instructions

## 2021-09-16 ENCOUNTER — Telehealth: Payer: Self-pay | Admitting: *Deleted

## 2021-09-16 DIAGNOSIS — E782 Mixed hyperlipidemia: Secondary | ICD-10-CM

## 2021-09-16 NOTE — Telephone Encounter (Signed)
Called patient and spoke w her.  Let her know results and recommendation for referral to lipid clinic.  She is in agreement with this and asks that we leave her a message with the appointment date/time.

## 2021-09-16 NOTE — Telephone Encounter (Signed)
-----   Message from Kathleene Hazel, MD sent at 09/15/2021 10:04 AM EST ----- LDL is 124 and she does not tolerate statins. Given her CAD, she will need to be referred to the lipid clinic to discuss Repatha or Praluent. She was receptive to this idea yesterday. Can we let her know and make the referral? Thanks, chris

## 2021-10-01 LAB — CUP PACEART REMOTE DEVICE CHECK
Date Time Interrogation Session: 20230224230834
Implantable Pulse Generator Implant Date: 20220401

## 2021-10-03 ENCOUNTER — Ambulatory Visit (INDEPENDENT_AMBULATORY_CARE_PROVIDER_SITE_OTHER): Payer: Medicare HMO

## 2021-10-03 DIAGNOSIS — I255 Ischemic cardiomyopathy: Secondary | ICD-10-CM

## 2021-10-10 NOTE — Progress Notes (Signed)
Patient ID: [name]                 DOB: [birth year]                    MRN: [id number] ? ? ? ? ?HPI: ?[name] is a 61 y.o. female patient referred to lipid clinic by Dr. Clifton Aguilar. PMH is significant for stroke (2022), CAD s/p stent to LAD in 2007 and STEMI in 2010 with stent thrombosis in the proximal LAD treated with balloon angioplasty, ischemic cardiomyopathy (LVEF 45-50% 10/2020), HLD, T2DM. Her cardiac cath in 2010 showed distal left main 20-30%, proximal LAD occluded at the proximal edge of the old stent, 50% proximal RCA, 30% mid RCA stenosis.  ? ?Today, patient arrives in good spirits accompanied by her friend [name]. She reports myalgias with multiple statins. She currently takes red yeast rice. Notes that she does have some leg pain. She still has the [employer] health plan but also has a Medicare plan.  ? ?Current Medications: red yeast rice ?Intolerances: atorvastatin, rosuvastatin (myalgias, trouble walking), ezetimibe (patient cannot remember why it was discontinued, no documentation per chart review) ?Risk Factors: CVA, STEMI, HLD, T2DM ?LDL goal: <55 mg/dL ? ?Diet: Often eats chicken and fish with vegetables. No pork or beef.  ? ?Exercise: MWF water aerobics ? ?Family History: DM in father, heart disease in grandfather, lupus in mother ? ?Social History: Former smoker (quit 1992) ? ?Labs: ?-09/14/21: TC 186, TG 69, HDL 49, LDL 124 (red yeast rice) ? ?Past Medical History:  ?Diagnosis Date  ? Arthritis   ? CAD (coronary artery disease)   ? s/p Taxus DES to pLAD 9/07; ant STEMI 6/10 due to late stent thrombosis treated with thrombectomy and POBA to LAD (LHC: ant apical AK, Ef 40%, dLM 20-30%, pLAD occluded at prox edge of stent (treated with PCI), R-L collats, pRCA 50%, mRCA 30%.) ;Myoview done 07/19/11: mod ant apical and inf apical scar, no ischemia, EF 45%    ? CVA (cerebral vascular accident) [facility])   ? Diabetes mellitus   ? Hyperlipidemia   ? Hypertension   ? Ischemic cardiomyopathy    ? echo 7/10: inf septal and apical HK, EF 45-50%, mild LAE.    ? Myocardial infarction [facility]) [phone]  ? ? ?Current Outpatient Medications on File Prior to Visit  ?Medication Sig Dispense Refill  ? aspirin 81 MG tablet Take 81 mg by mouth daily with breakfast.    ? b complex vitamins capsule Take 1 capsule by mouth daily.    ? carvedilol (COREG) 12.5 MG tablet Take 1 tablet (12.5 mg total) by mouth 2 (two) times daily. 180 tablet 3  ? Cholecalciferol (VITAMIN D-3) 125 MCG (5000 UT) TABS Take 1 tablet by mouth daily.    ? clopidogrel (PLAVIX) 75 MG tablet Take 75 mg by mouth daily.    ? cyclobenzaprine (FLEXERIL) 10 MG tablet Take 10 mg by mouth 2 (two) times daily as needed for muscle spasms.    ? glipiZIDE (GLUCOTROL) 5 MG tablet Take 1 tablet by mouth in the morning and at bedtime.    ? metFORMIN (GLUCOPHAGE) 1000 MG tablet Take 1,000 mg by mouth 2 (two) times daily.    ? ONE TOUCH ULTRA TEST test strip     ? Red Yeast Rice Extract (RED YEAST RICE PO) Take 1 tablet by mouth daily.    ? Semaglutide, 2 MG/DOSE, (OZEMPIC, 2 MG/DOSE,) 8 MG/3ML SOPN Inject 2 mg into the  skin once a week. 3 mL 5  ? traMADol (ULTRAM) 50 MG tablet Take 50 mg by mouth every 12 (twelve) hours as needed for moderate pain.    ? UNABLE TO FIND Take 1 tablet by mouth daily. Med Name: Natto-serrazime    ? valsartan (DIOVAN) 80 MG tablet Take 80 mg by mouth at bedtime.    ? ?No current facility-administered medications on file prior to visit.  ? ? ?No Active Allergies ? ?Assessment/Plan: ? ?1. Hyperlipidemia - LDL not at goal <55 mg/dL given history of CVA + STEMI. She is intolerant to atorvastatin and rosuvastatin. Will start PCSK9i to lower LDL ~60% and decrease cardiac risk. Stop red yeast rice. Will submit PA to her insurance and update the patient when we hear back. No active coverage found for [facility] Plan so submitted PA to [facility] plan. Will schedule lab visit for 6-8 weeks after starting therapy at that time. Counseled  patient on possible adverse effects, storage, dosing, and administration of PCSK9i and she confirmed understanding.  ? ?Pervis Hocking, PharmD ?PGY2 Ambulatory Care Pharmacy Resident ?10/11/2021 2:54 PM ? ?

## 2021-10-10 NOTE — Progress Notes (Signed)
[facility] Summary Report / Loop Recorder 

## 2021-10-11 ENCOUNTER — Ambulatory Visit: Payer: Medicare PPO | Admitting: Student-PharmD

## 2021-10-11 ENCOUNTER — Other Ambulatory Visit: Payer: Self-pay

## 2021-10-11 DIAGNOSIS — I2510 Atherosclerotic heart disease of native coronary artery without angina pectoris: Secondary | ICD-10-CM

## 2021-10-11 DIAGNOSIS — E782 Mixed hyperlipidemia: Secondary | ICD-10-CM

## 2021-10-11 NOTE — Patient Instructions (Addendum)
Nice to see you today! ? ?Keep up the good work with diet and exercise. Aim for a diet full of vegetables, fruit and lean meats (chicken, [place], fish). Try to limit carbs (bread, pasta, sugar, rice) and red meat consumption. ? ?Your goal LDL is less than 55 mg/dL, you're currently at 937 mg/dL ? ?Medication Changes: ?Stop taking red yeast rice.  ? ?I will submit a prior authorization to your insurance for either Repatha or Praluent. I will give you a call when I have an update on this.  ? ?Please give [place] a call at [phone] with any questions or concerns. ? ?For Repatha or Praluent, inject once every other week (any day of the week that works for you) into the fatty skin of stomach, upper outer thigh or back of the arm. Clean the site with soap and warm water or an alcohol pad. Keep the medication in the fridge until you are ready to give your dose, then take it out and let warm up to room temperature for 30-60 mins. ? ?

## 2021-10-12 ENCOUNTER — Encounter: Payer: Self-pay | Admitting: Student-PharmD

## 2021-10-12 ENCOUNTER — Telehealth: Payer: Self-pay | Admitting: Student-PharmD

## 2021-10-12 DIAGNOSIS — E782 Mixed hyperlipidemia: Secondary | ICD-10-CM

## 2021-10-12 MED ORDER — REPATHA SURECLICK 140 MG/ML ~~LOC~~ SOAJ: 140.0000 mg | SUBCUTANEOUS | 11 refills | Status: DC

## 2021-10-12 NOTE — Telephone Encounter (Signed)
Repatha PA approved through 04/09/22 through her [place].  ? ?Called the patient to let her know of this and signed her up for the [employer] over the phone. She is scheduled for fasting labs on 12/06/21. Provided the following information to her pharmacy (confirmed with them that they are running it through her Medicare plan and the grant secondary, it came out to $0) and to the patient via MyChart: ? ?CARD NO: [id number]  ?BIN: [id number] ?PCN: [id number] ?PC GROUP: [id number] ? ?

## 2021-10-17 ENCOUNTER — Other Ambulatory Visit (HOSPITAL_COMMUNITY): Payer: Self-pay

## 2021-11-07 ENCOUNTER — Ambulatory Visit (INDEPENDENT_AMBULATORY_CARE_PROVIDER_SITE_OTHER): Payer: Medicare HMO

## 2021-11-07 DIAGNOSIS — I6300 Cerebral infarction due to thrombosis of unspecified precerebral artery: Secondary | ICD-10-CM

## 2021-11-08 LAB — CUP PACEART REMOTE DEVICE CHECK
Date Time Interrogation Session: 20230331230712
Implantable Pulse Generator Implant Date: 20220401

## 2021-11-16 ENCOUNTER — Other Ambulatory Visit (HOSPITAL_COMMUNITY): Payer: Self-pay

## 2021-11-17 ENCOUNTER — Telehealth: Payer: Self-pay | Admitting: Cardiovascular Disease

## 2021-11-17 NOTE — Telephone Encounter (Signed)
Pt c/o medication issue: ? ?1. Name of Medication: carvedilol (COREG) 12.5 MG tablet ? ?2. How are you currently taking this medication (dosage and times per day)? Take 1 tablet (12.5 mg total) by mouth 2 (two) times daily. ? ?3. Are you having a reaction (difficulty breathing--STAT)?  ? ?4. What is your medication issue? Pharmacy has it as above.  Patient has been taken 2 tablets. Twice a day.  For a total of four tablets. Daily.  They would like this clarified.    ?

## 2021-11-18 NOTE — Telephone Encounter (Signed)
I spoke with the patient.  She said since she had her stroke her memory is not as good.   She doubled the previous dose of cardvedilol (6.25 mg) after her last ov.   She doesn't have the most recent bottle that she finished, but according to [facility] they filled the 12.5 mg dose.  The patient stated that she continued to take 2 pills bid (25 mg BID).  She has been out of carvedilol completely for about a week. ? ? ?I spoke with [facility].  They will call her insurance to see if an override can be done so that she can get her medicine early.  If not, they can give her pills through 4/24 - and she will need to pay out of pocked for those.  I asked her to call us back to let us know if insurance does not do an override. ?

## 2021-11-22 NOTE — Progress Notes (Signed)
[facility] Summary Report / Loop Recorder 

## 2021-12-06 ENCOUNTER — Other Ambulatory Visit: Payer: Medicare PPO

## 2021-12-08 LAB — CUP PACEART REMOTE DEVICE CHECK
Date Time Interrogation Session: 20230503231254
Implantable Pulse Generator Implant Date: 20220401

## 2021-12-12 ENCOUNTER — Ambulatory Visit (INDEPENDENT_AMBULATORY_CARE_PROVIDER_SITE_OTHER): Payer: Medicare HMO

## 2021-12-12 DIAGNOSIS — I6300 Cerebral infarction due to thrombosis of unspecified precerebral artery: Secondary | ICD-10-CM

## 2021-12-13 ENCOUNTER — Telehealth: Payer: Self-pay | Admitting: Student-PharmD

## 2021-12-13 NOTE — Telephone Encounter (Signed)
Called patient to reschedule lab appt which the patient canceled on 12/06/21 to recheck lipids after starting Repatha. It appears per her fill history in Epic that she is taking Repatha. Unable to reach, LVM requesting call back.  ?

## 2021-12-20 NOTE — Telephone Encounter (Signed)
2nd attempt to reach patient to reschedule lab appt. LVM.  ?

## 2021-12-23 ENCOUNTER — Other Ambulatory Visit (HOSPITAL_COMMUNITY): Payer: Self-pay

## 2021-12-27 NOTE — Telephone Encounter (Signed)
3rd attempt to reach patient to reschedule lab appt and was unable to reach. LVM. Will no longer attempt to reach and will await patient's call back at this time.

## 2021-12-29 NOTE — Progress Notes (Signed)
[facility] Summary Report / Loop Recorder 

## 2022-01-16 ENCOUNTER — Ambulatory Visit (INDEPENDENT_AMBULATORY_CARE_PROVIDER_SITE_OTHER): Payer: Medicare HMO

## 2022-01-16 DIAGNOSIS — I6300 Cerebral infarction due to thrombosis of unspecified precerebral artery: Secondary | ICD-10-CM

## 2022-01-18 ENCOUNTER — Other Ambulatory Visit: Payer: Self-pay | Admitting: Internal Medicine

## 2022-01-18 DIAGNOSIS — M48061 Spinal stenosis, lumbar region without neurogenic claudication: Secondary | ICD-10-CM

## 2022-01-18 LAB — CUP PACEART REMOTE DEVICE CHECK
Date Time Interrogation Session: 20230605230620
Implantable Pulse Generator Implant Date: 20220401

## 2022-02-01 NOTE — Progress Notes (Signed)
[facility] Summary Report / Loop Recorder 

## 2022-02-11 ENCOUNTER — Ambulatory Visit
Admission: RE | Admit: 2022-02-11 | Discharge: 2022-02-11 | Disposition: A | Discharge status: Home / Self Care | Payer: Medicare PPO | Source: Ambulatory Visit | Attending: Internal Medicine | Admitting: Internal Medicine

## 2022-02-11 DIAGNOSIS — M48061 Spinal stenosis, lumbar region without neurogenic claudication: Secondary | ICD-10-CM

## 2022-02-16 LAB — CUP PACEART REMOTE DEVICE CHECK
Date Time Interrogation Session: 20230708230626
Implantable Pulse Generator Implant Date: 20220401

## 2022-02-20 ENCOUNTER — Ambulatory Visit (INDEPENDENT_AMBULATORY_CARE_PROVIDER_SITE_OTHER): Payer: Medicare HMO

## 2022-02-20 DIAGNOSIS — I6300 Cerebral infarction due to thrombosis of unspecified precerebral artery: Secondary | ICD-10-CM

## 2022-03-23 NOTE — Progress Notes (Signed)
[facility] Summary Report / Loop Recorder 

## 2022-03-27 ENCOUNTER — Ambulatory Visit (INDEPENDENT_AMBULATORY_CARE_PROVIDER_SITE_OTHER): Payer: Medicare PPO

## 2022-03-27 DIAGNOSIS — I6300 Cerebral infarction due to thrombosis of unspecified precerebral artery: Secondary | ICD-10-CM

## 2022-03-28 LAB — CUP PACEART REMOTE DEVICE CHECK
Date Time Interrogation Session: 20230820231547
Implantable Pulse Generator Implant Date: 20220401

## 2022-04-19 ENCOUNTER — Telehealth: Payer: Self-pay | Admitting: Pharmacist

## 2022-04-19 NOTE — Telephone Encounter (Signed)
Called pt to see if she is still taking Repatha. Need updated labs in order to submit to renew her PA. Called and LVM for pt to call back.

## 2022-04-24 NOTE — Progress Notes (Signed)
[facility] Summary Report / Loop Recorder 

## 2022-05-01 ENCOUNTER — Ambulatory Visit (INDEPENDENT_AMBULATORY_CARE_PROVIDER_SITE_OTHER): Payer: Medicare HMO

## 2022-05-01 DIAGNOSIS — I6300 Cerebral infarction due to thrombosis of unspecified precerebral artery: Secondary | ICD-10-CM

## 2022-05-02 LAB — CUP PACEART REMOTE DEVICE CHECK
Date Time Interrogation Session: 20230922230151
Implantable Pulse Generator Implant Date: 20220401

## 2022-05-07 IMAGING — MR MR HEAD W/O CM
6 of 10 series · 27 of 48 positions shown · non-contrast
Comparison: Prior CT imaging

CLINICAL DATA: Stroke, follow-up

EXAM:
MRI HEAD WITHOUT CONTRAST
TECHNIQUE: Multiplanar, multiecho pulse sequences of the brain and surrounding
structures were obtained without intravenous contrast.

[Series 2: DWI · axial · 3.0mm · 0.94mm/px · z∈[-56,+86]mm · 8 of 100 slices shown (1 of 2)]
[im 1/100]
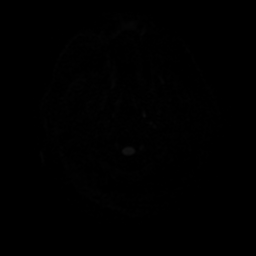
[im 12/100]
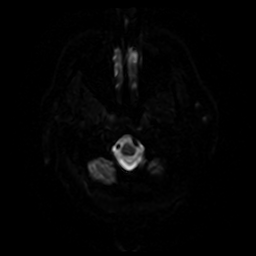
[im 34/100]
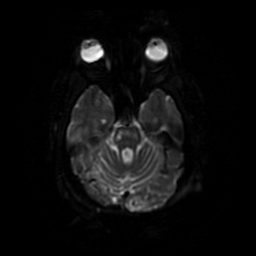
[im 45/100]
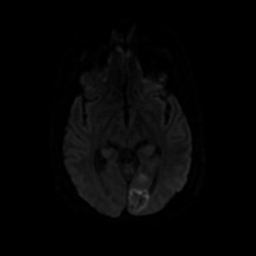
[im 56/100]
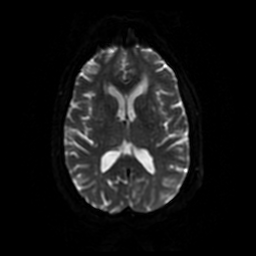
[im 67/100]
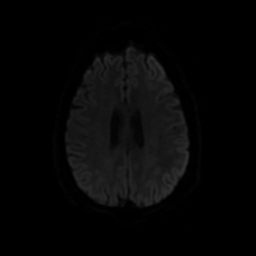
[im 89/100]
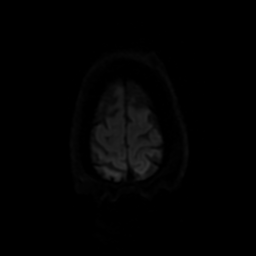
[im 100/100]
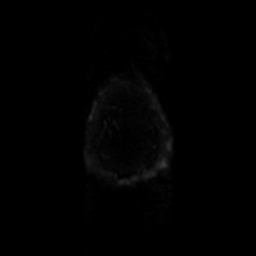

[Series 3: DWI · coronal · 4.0mm · 0.94mm/px · 7 of 72 slices shown (2 of 2)]
[im 1/72]
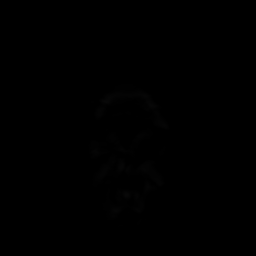
[im 12/72]
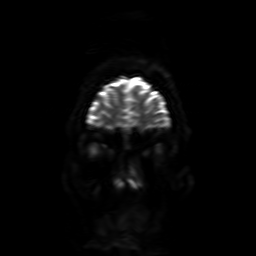
[im 24/72]
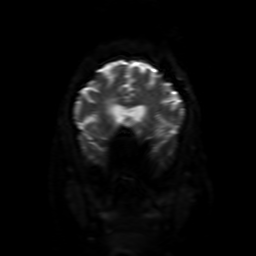
[im 36/72]
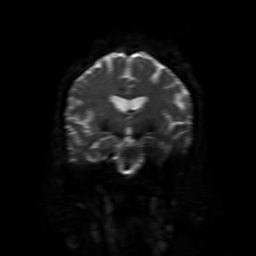
[im 48/72]
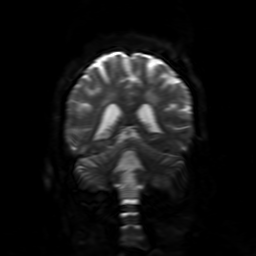
[im 60/72]
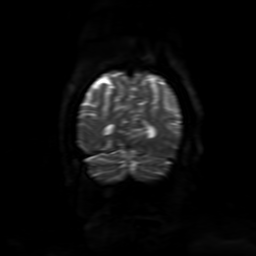
[im 72/72]
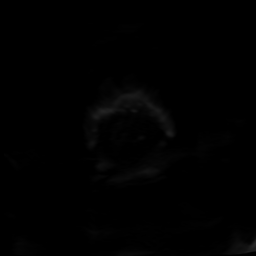

[Series 4: FLAIR · sagittal · 5.0mm · 0.23mm/px · 2 of 23 slices shown (1 of 2)]
[im 1/23]
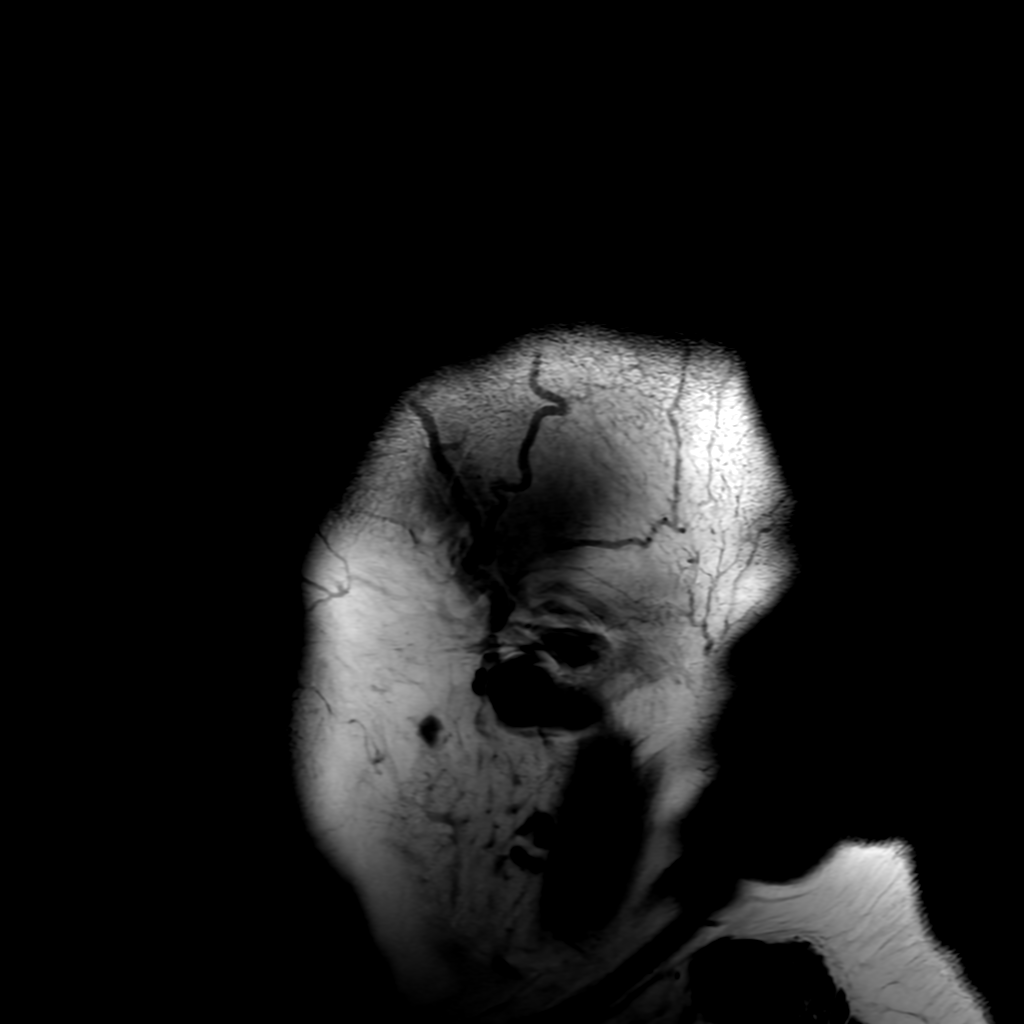
[im 23/23]
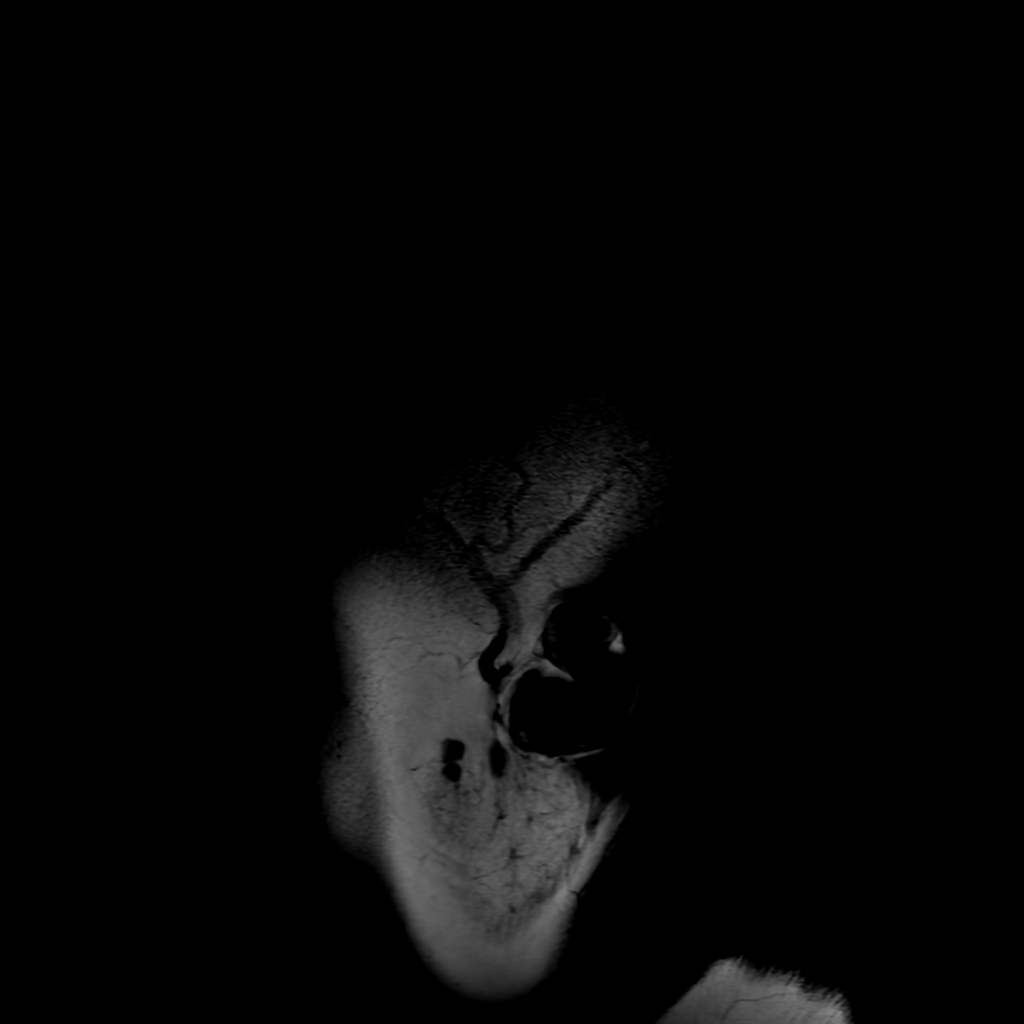

[Series 6: FLAIR · axial · 3.0mm · 0.45mm/px · z∈[-55,+84]mm · 2 of 25 slices shown (2 of 2)]
[im 1/25]
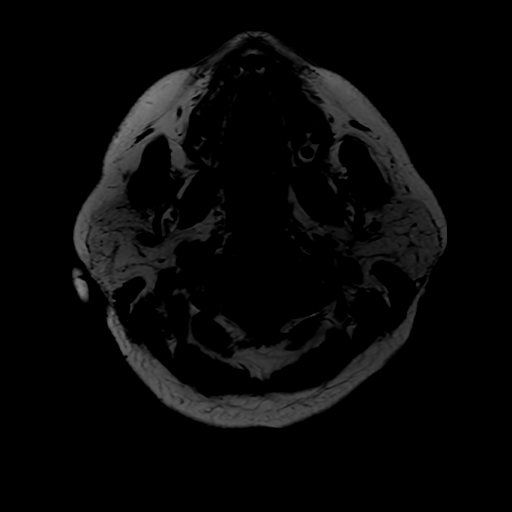
[im 25/25]
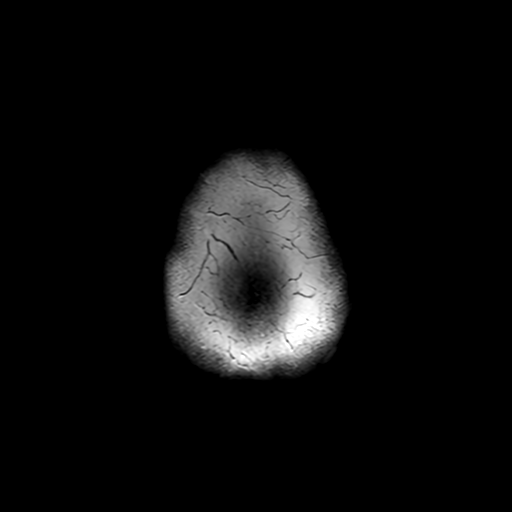

[Series 250: ADC · axial · 3.0mm · 0.94mm/px · z∈[-56,+86]mm · 5 of 50 slices shown (1 of 2)]
[im 1/50]
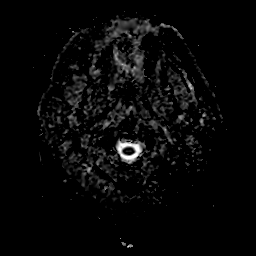
[im 13/50]
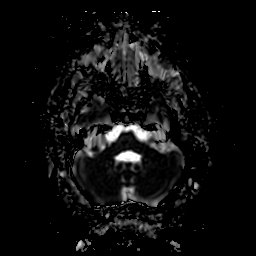
[im 25/50]
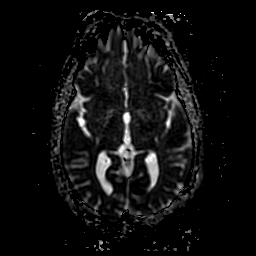
[im 37/50]
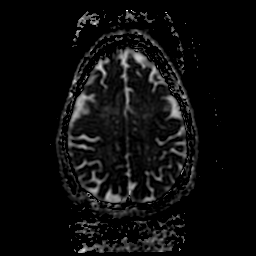
[im 50/50]
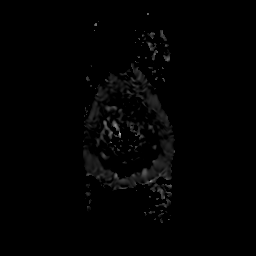

[Series 350: ADC · coronal · 4.0mm · 0.94mm/px · 3 of 36 slices shown (2 of 2)]
[im 1/36]
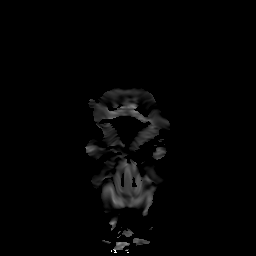
[im 18/36]
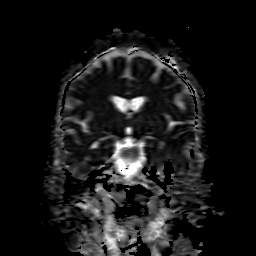
[im 36/36]
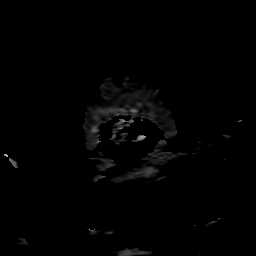

[27 of 48 positions shown; findings below may reference images not displayed]

FINDINGS: Brain: Mildly reduced diffusion in the parasagittal left occipital
lobe with corresponding susceptibility.

Patchy and confluent areas of T2 hyperintensity in the
supratentorial and pontine white matter are nonspecific but may
reflect moderate chronic microvascular ischemic changes. There are
chronic small vessel infarcts of the pons, thalamus, and left
cerebellum.

There is no significant mass effect. No hydrocephalus or extra-axial
collection.

Vascular: Major vessel flow voids at the skull base are preserved.

Skull and upper cervical spine: Normal marrow signal is preserved.

Sinuses/Orbits: Paranasal sinuses are aerated. Orbits are
unremarkable.

Other: Sella is unremarkable.  Mastoid air cells are clear.
IMPRESSION: Acute to subacute left occipital infarction with hemorrhagic
conversion. No mass effect.

Chronic microvascular ischemic changes and chronic small-vessel
infarcts.

## 2022-05-17 NOTE — Progress Notes (Signed)
[facility] Summary Report / Loop Recorder 

## 2022-06-05 ENCOUNTER — Ambulatory Visit (INDEPENDENT_AMBULATORY_CARE_PROVIDER_SITE_OTHER): Payer: Medicare PPO

## 2022-06-05 DIAGNOSIS — I6300 Cerebral infarction due to thrombosis of unspecified precerebral artery: Secondary | ICD-10-CM | POA: Diagnosis not present

## 2022-06-05 LAB — CUP PACEART REMOTE DEVICE CHECK
Date Time Interrogation Session: 20231025230824
Implantable Pulse Generator Implant Date: 20220401

## 2022-07-08 NOTE — Progress Notes (Signed)
[facility] Summary Report / Loop Recorder 

## 2022-07-10 ENCOUNTER — Ambulatory Visit (INDEPENDENT_AMBULATORY_CARE_PROVIDER_SITE_OTHER): Payer: Medicare HMO

## 2022-07-10 DIAGNOSIS — I6300 Cerebral infarction due to thrombosis of unspecified precerebral artery: Secondary | ICD-10-CM

## 2022-07-10 LAB — CUP PACEART REMOTE DEVICE CHECK
Date Time Interrogation Session: 20231203230930
Implantable Pulse Generator Implant Date: 20220401

## 2022-08-14 ENCOUNTER — Ambulatory Visit (INDEPENDENT_AMBULATORY_CARE_PROVIDER_SITE_OTHER): Payer: Medicare HMO

## 2022-08-14 DIAGNOSIS — I6300 Cerebral infarction due to thrombosis of unspecified precerebral artery: Secondary | ICD-10-CM

## 2022-08-15 LAB — CUP PACEART REMOTE DEVICE CHECK
Date Time Interrogation Session: 20240107231403
Implantable Pulse Generator Implant Date: 20220401

## 2022-08-17 NOTE — Progress Notes (Signed)
[facility] Summary Report / Loop Recorder 

## 2022-08-21 ENCOUNTER — Other Ambulatory Visit: Payer: Self-pay

## 2022-08-21 MED ORDER — CARVEDILOL 12.5 MG PO TABS: 12.5000 mg | ORAL_TABLET | Freq: Two times a day (BID) | ORAL | 0 refills | Status: DC

## 2022-08-29 ENCOUNTER — Other Ambulatory Visit (HOSPITAL_COMMUNITY): Payer: Self-pay

## 2022-09-18 ENCOUNTER — Ambulatory Visit: Payer: Medicare PPO

## 2022-09-18 DIAGNOSIS — I6300 Cerebral infarction due to thrombosis of unspecified precerebral artery: Secondary | ICD-10-CM | POA: Diagnosis not present

## 2022-09-19 LAB — CUP PACEART REMOTE DEVICE CHECK
Date Time Interrogation Session: 20240209230849
Implantable Pulse Generator Implant Date: 20220401

## 2022-09-21 NOTE — Progress Notes (Signed)
[facility] Summary Report / Loop Recorder 

## 2022-10-23 ENCOUNTER — Ambulatory Visit (INDEPENDENT_AMBULATORY_CARE_PROVIDER_SITE_OTHER): Payer: Medicare PPO

## 2022-10-23 ENCOUNTER — Other Ambulatory Visit: Payer: Self-pay | Admitting: Pharmacist

## 2022-10-23 DIAGNOSIS — I6300 Cerebral infarction due to thrombosis of unspecified precerebral artery: Secondary | ICD-10-CM

## 2022-10-23 MED ORDER — REPATHA SURECLICK 140 MG/ML ~~LOC~~ SOAJ: 140.0000 mg | SUBCUTANEOUS | 11 refills | Status: AC

## 2022-10-24 LAB — CUP PACEART REMOTE DEVICE CHECK
Date Time Interrogation Session: 20240317231551
Implantable Pulse Generator Implant Date: 20220401

## 2022-10-25 ENCOUNTER — Ambulatory Visit: Payer: Medicare PPO | Admitting: Podiatry

## 2022-10-25 DIAGNOSIS — M2042 Other hammer toe(s) (acquired), left foot: Secondary | ICD-10-CM | POA: Diagnosis not present

## 2022-10-25 NOTE — Progress Notes (Signed)
Subjective:  Patient ID: [name], female    DOB: 1961/07/29,  MRN: [id number]  Chief Complaint  Patient presents with   Nail Problem    62 y.o. female presents with the above complaint.  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  She states that has been bothering some for few months has progressive gotten worse she wanted to get it evaluated she has not tried all conservative care.  She would like to discuss treatment options pain scale to out of 10 dull achy in nature.  She wears tight toebox shoes.   Review of Systems: Negative except as noted in the HPI. Denies N/V/F/Ch.  Past Medical History:  Diagnosis Date   Arthritis    CAD (coronary artery disease)    s/p Taxus DES to pLAD 9/07; ant STEMI 6/10 due to late stent thrombosis treated with thrombectomy and POBA to LAD (LHC: ant apical AK, Ef 40%, dLM 20-30%, pLAD occluded at prox edge of stent (treated with PCI), R-L collats, pRCA 50%, mRCA 30%.) ;Myoview done 07/19/11: mod ant apical and inf apical scar, no ischemia, EF 45%     CVA (cerebral vascular accident) ([place])    Diabetes mellitus    Hyperlipidemia    Hypertension    Ischemic cardiomyopathy    echo 7/10: inf septal and apical HK, EF 45-50%, mild LAE.     [id number]    Current Outpatient Medications:    aspirin 81 MG tablet, Take 81 mg by mouth daily with breakfast., Disp: , Rfl:    b complex vitamins capsule, Take 1 capsule by mouth daily., Disp: , Rfl:    carvedilol (COREG) 12.5 MG tablet, Take 1 tablet (12.5 mg total) by mouth 2 (two) times daily., Disp: 180 tablet, Rfl: 0   Cholecalciferol (VITAMIN D-3) 125 MCG (5000 UT) TABS, Take 1 tablet by mouth daily., Disp: , Rfl:    clopidogrel (PLAVIX) 75 MG tablet, Take 75 mg by mouth daily., Disp: , Rfl:    cyclobenzaprine (FLEXERIL) 10 MG tablet, Take 10 mg by mouth 2 (two) times daily as needed for muscle spasms., Disp: , Rfl:     Evolocumab (REPATHA SURECLICK) [id number] MG/ML SOAJ, Inject 140 mg into the skin every 14 (fourteen) days., Disp: 2 mL, Rfl: 11   glipiZIDE (GLUCOTROL) 5 MG tablet, Take 1 tablet by mouth in the morning and at bedtime., Disp: , Rfl:    metFORMIN (GLUCOPHAGE) 1000 MG tablet, Take 1,000 mg by mouth 2 (two) times daily., Disp: , Rfl:    ONE TOUCH ULTRA TEST test strip, , Disp: , Rfl:    Semaglutide, 2 MG/DOSE, (OZEMPIC, 2 MG/DOSE,) 8 MG/3ML SOPN, Inject 2 mg into the skin once a week., Disp: 3 mL, Rfl: 5   traMADol (ULTRAM) 50 MG tablet, Take 50 mg by mouth every 12 (twelve) hours as needed for moderate pain., Disp: , Rfl:    UNABLE TO FIND, Take 1 tablet by mouth daily. Med Name: Natto-serrazime, Disp: , Rfl:    valsartan (DIOVAN) 80 MG tablet, Take 80 mg by mouth at bedtime., Disp: , Rfl:   Social History   Tobacco Use  Smoking Status Former   Years: 15   Types: Cigarettes  Smokeless Tobacco Former   Quit date: 01/18/1991  Tobacco Comments   QUIT 21 YEARS AGO    No Active Allergies Objective:  There were no vitals filed for this visit. There is no height or weight on file to calculate  BMI. Constitutional Well developed. Well nourished.  Vascular Dorsalis pedis pulses palpable bilaterally. Posterior tibial pulses palpable bilaterally. Capillary refill normal to all digits.  No cyanosis or clubbing noted. Pedal hair growth normal.  Neurologic Normal speech. Oriented to person, place, and time. Epicritic sensation to light touch grossly present bilaterally.  Dermatologic Nails well groomed and normal in appearance. No open wounds. No skin lesions.  Orthopedic: Pain care pain on palpation of right fifth digit with adductovarus deformity and hammertoe contracture.  Pain on palpation to the outer side of the fifth toe.  No other abnormalities identified.  No other orthopedic deformities noted   Radiographs: None Assessment:   1. Acquired hammertoe of left foot    Plan:  Patient was  evaluated and treated and all questions answered.  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  At this time she would like to focus on conservative care -I discussed surgical options briefly as well.  She will get back on when she is ready.  No follow-ups on file.

## 2022-11-02 NOTE — Progress Notes (Signed)
[facility] Summary Report / Loop Recorder 

## 2022-11-27 ENCOUNTER — Ambulatory Visit: Payer: Medicare HMO

## 2022-11-27 LAB — CUP PACEART REMOTE DEVICE CHECK
Date Time Interrogation Session: 20240419230331
Implantable Pulse Generator Implant Date: 20220401

## 2022-11-30 ENCOUNTER — Ambulatory Visit (INDEPENDENT_AMBULATORY_CARE_PROVIDER_SITE_OTHER): Payer: Medicare PPO

## 2022-11-30 DIAGNOSIS — I255 Ischemic cardiomyopathy: Secondary | ICD-10-CM

## 2022-12-05 NOTE — Progress Notes (Signed)
[facility] Summary Report / Loop Recorder 

## 2022-12-26 NOTE — Progress Notes (Signed)
[facility] Summary Report / Loop Recorder 

## 2022-12-28 LAB — CUP PACEART REMOTE DEVICE CHECK
Date Time Interrogation Session: 20240522230340
Implantable Pulse Generator Implant Date: 20220401

## 2023-01-02 ENCOUNTER — Ambulatory Visit (INDEPENDENT_AMBULATORY_CARE_PROVIDER_SITE_OTHER): Payer: Medicare HMO

## 2023-01-02 DIAGNOSIS — I255 Ischemic cardiomyopathy: Secondary | ICD-10-CM | POA: Diagnosis not present

## 2023-01-18 ENCOUNTER — Other Ambulatory Visit: Payer: Self-pay | Admitting: Internal Medicine

## 2023-01-18 DIAGNOSIS — M48061 Spinal stenosis, lumbar region without neurogenic claudication: Secondary | ICD-10-CM

## 2023-01-29 NOTE — Progress Notes (Signed)
[facility] Summary Report / Loop Recorder 

## 2023-01-30 LAB — CUP PACEART REMOTE DEVICE CHECK
Date Time Interrogation Session: 20240624230240
Implantable Pulse Generator Implant Date: 20220401

## 2023-02-05 ENCOUNTER — Other Ambulatory Visit: Payer: Self-pay | Admitting: Internal Medicine

## 2023-02-05 ENCOUNTER — Ambulatory Visit (INDEPENDENT_AMBULATORY_CARE_PROVIDER_SITE_OTHER): Payer: Medicare HMO

## 2023-02-05 DIAGNOSIS — I6300 Cerebral infarction due to thrombosis of unspecified precerebral artery: Secondary | ICD-10-CM

## 2023-02-05 DIAGNOSIS — Z1231 Encounter for screening mammogram for malignant neoplasm of breast: Secondary | ICD-10-CM

## 2023-02-05 DIAGNOSIS — I255 Ischemic cardiomyopathy: Secondary | ICD-10-CM | POA: Diagnosis not present

## 2023-02-06 ENCOUNTER — Other Ambulatory Visit: Payer: Self-pay | Admitting: Cardiovascular Disease

## 2023-02-21 ENCOUNTER — Other Ambulatory Visit: Payer: Medicare PPO

## 2023-02-21 ENCOUNTER — Encounter: Payer: Self-pay | Admitting: Internal Medicine

## 2023-02-23 ENCOUNTER — Other Ambulatory Visit: Payer: Medicare PPO

## 2023-02-26 NOTE — Progress Notes (Signed)
[facility] Summary Report / Loop Recorder 

## 2023-02-28 ENCOUNTER — Ambulatory Visit: Payer: Medicare HMO

## 2023-03-01 ENCOUNTER — Ambulatory Visit
Admission: RE | Admit: 2023-03-01 | Discharge: 2023-03-01 | Disposition: A | Discharge status: Home / Self Care | Payer: Medicare HMO | Source: Ambulatory Visit | Attending: Internal Medicine | Admitting: Internal Medicine

## 2023-03-01 DIAGNOSIS — M48061 Spinal stenosis, lumbar region without neurogenic claudication: Secondary | ICD-10-CM

## 2023-03-02 ENCOUNTER — Ambulatory Visit
Admission: RE | Admit: 2023-03-02 | Discharge: 2023-03-02 | Disposition: A | Discharge status: Home / Self Care | Payer: Medicare HMO | Source: Ambulatory Visit | Attending: Internal Medicine | Admitting: Internal Medicine

## 2023-03-02 DIAGNOSIS — Z1231 Encounter for screening mammogram for malignant neoplasm of breast: Secondary | ICD-10-CM

## 2023-03-08 ENCOUNTER — Other Ambulatory Visit: Payer: Self-pay | Admitting: Cardiovascular Disease

## 2023-03-12 ENCOUNTER — Ambulatory Visit: Payer: Medicare HMO

## 2023-03-12 DIAGNOSIS — I6300 Cerebral infarction due to thrombosis of unspecified precerebral artery: Secondary | ICD-10-CM | POA: Diagnosis not present

## 2023-03-21 ENCOUNTER — Other Ambulatory Visit: Payer: Self-pay | Admitting: Internal Medicine

## 2023-03-21 DIAGNOSIS — E2839 Other primary ovarian failure: Secondary | ICD-10-CM

## 2023-03-27 NOTE — Progress Notes (Signed)
[facility] Summary Report / Loop Recorder 

## 2023-04-16 ENCOUNTER — Ambulatory Visit: Payer: Medicare HMO

## 2023-04-16 DIAGNOSIS — I6300 Cerebral infarction due to thrombosis of unspecified precerebral artery: Secondary | ICD-10-CM | POA: Diagnosis not present

## 2023-04-16 LAB — CUP PACEART REMOTE DEVICE CHECK
Date Time Interrogation Session: 20240906230408
Implantable Pulse Generator Implant Date: 20220401

## 2023-05-03 NOTE — Progress Notes (Signed)
[facility] Summary Report / Loop Recorder 

## 2023-05-13 ENCOUNTER — Other Ambulatory Visit: Payer: Self-pay | Admitting: Cardiovascular Disease

## 2023-05-21 ENCOUNTER — Ambulatory Visit (INDEPENDENT_AMBULATORY_CARE_PROVIDER_SITE_OTHER): Payer: Medicare HMO

## 2023-05-21 DIAGNOSIS — I6300 Cerebral infarction due to thrombosis of unspecified precerebral artery: Secondary | ICD-10-CM | POA: Diagnosis not present

## 2023-05-21 LAB — CUP PACEART REMOTE DEVICE CHECK
Date Time Interrogation Session: 20241013230900
Implantable Pulse Generator Implant Date: 20220401

## 2023-06-06 NOTE — Progress Notes (Signed)
[facility] Summary Report / Loop Recorder 

## 2023-06-13 ENCOUNTER — Other Ambulatory Visit: Payer: Self-pay | Admitting: Cardiovascular Disease

## 2023-06-14 NOTE — Telephone Encounter (Signed)
Pt of Dr. Gibson Ramp is past her third attempt. Pharmacy is requesting another refill. Does Dr. Clifton James want to refill? Please advise

## 2023-06-14 NOTE — Telephone Encounter (Signed)
Left detailed message (DPR)  for patient to call office to schedule appointment so that we can fill meds.

## 2023-06-25 ENCOUNTER — Ambulatory Visit (INDEPENDENT_AMBULATORY_CARE_PROVIDER_SITE_OTHER): Payer: Medicare HMO

## 2023-06-25 DIAGNOSIS — I6300 Cerebral infarction due to thrombosis of unspecified precerebral artery: Secondary | ICD-10-CM

## 2023-06-25 LAB — CUP PACEART REMOTE DEVICE CHECK
Date Time Interrogation Session: 20241117230942
Implantable Pulse Generator Implant Date: 20220401

## 2023-07-20 NOTE — Progress Notes (Signed)
[facility] Summary Report / Loop Recorder 

## 2023-07-30 ENCOUNTER — Ambulatory Visit (INDEPENDENT_AMBULATORY_CARE_PROVIDER_SITE_OTHER): Payer: Medicare HMO

## 2023-07-30 DIAGNOSIS — I6300 Cerebral infarction due to thrombosis of unspecified precerebral artery: Secondary | ICD-10-CM | POA: Diagnosis not present

## 2023-07-30 LAB — CUP PACEART REMOTE DEVICE CHECK
Date Time Interrogation Session: 20241222230835
Implantable Pulse Generator Implant Date: 20220401

## 2023-09-03 ENCOUNTER — Ambulatory Visit (INDEPENDENT_AMBULATORY_CARE_PROVIDER_SITE_OTHER): Payer: Medicare HMO

## 2023-09-03 DIAGNOSIS — I6300 Cerebral infarction due to thrombosis of unspecified precerebral artery: Secondary | ICD-10-CM | POA: Diagnosis not present

## 2023-09-03 LAB — CUP PACEART REMOTE DEVICE CHECK
Date Time Interrogation Session: 20250126230755
Implantable Pulse Generator Implant Date: 20220401

## 2023-09-07 NOTE — Progress Notes (Signed)
 [facility] Summary Report / Loop Recorder

## 2023-10-08 ENCOUNTER — Ambulatory Visit (INDEPENDENT_AMBULATORY_CARE_PROVIDER_SITE_OTHER): Payer: Medicare HMO

## 2023-10-08 ENCOUNTER — Ambulatory Visit
Admission: RE | Admit: 2023-10-08 | Discharge: 2023-10-08 | Disposition: A | Discharge status: Home / Self Care | Payer: Medicare HMO | Source: Ambulatory Visit | Attending: Internal Medicine | Admitting: Internal Medicine

## 2023-10-08 DIAGNOSIS — I6300 Cerebral infarction due to thrombosis of unspecified precerebral artery: Secondary | ICD-10-CM | POA: Diagnosis not present

## 2023-10-08 DIAGNOSIS — E2839 Other primary ovarian failure: Secondary | ICD-10-CM

## 2023-10-10 LAB — CUP PACEART REMOTE DEVICE CHECK
Date Time Interrogation Session: 20250302230451
Implantable Pulse Generator Implant Date: 20220401

## 2023-10-12 NOTE — Progress Notes (Signed)
 [facility] Summary Report / Loop Recorder

## 2023-11-09 NOTE — Progress Notes (Signed)
 [facility] Summary Report / Loop Recorder

## 2023-11-09 NOTE — Addendum Note (Signed)
 Addended by: Geralyn Flash D on: 11/09/2023 11:44 AM   Modules accepted: Orders

## 2023-11-12 ENCOUNTER — Ambulatory Visit (INDEPENDENT_AMBULATORY_CARE_PROVIDER_SITE_OTHER): Payer: Medicare HMO

## 2023-11-12 DIAGNOSIS — I6300 Cerebral infarction due to thrombosis of unspecified precerebral artery: Secondary | ICD-10-CM

## 2023-11-13 LAB — CUP PACEART REMOTE DEVICE CHECK
Date Time Interrogation Session: 20250406230453
Implantable Pulse Generator Implant Date: 20220401

## 2023-12-17 ENCOUNTER — Ambulatory Visit (INDEPENDENT_AMBULATORY_CARE_PROVIDER_SITE_OTHER): Payer: Medicare HMO

## 2023-12-17 DIAGNOSIS — I6300 Cerebral infarction due to thrombosis of unspecified precerebral artery: Secondary | ICD-10-CM | POA: Diagnosis not present

## 2023-12-18 ENCOUNTER — Ambulatory Visit: Payer: Self-pay | Admitting: Cardiology

## 2023-12-18 LAB — CUP PACEART REMOTE DEVICE CHECK
Date Time Interrogation Session: 20250511233604
Implantable Pulse Generator Implant Date: 20220401

## 2023-12-22 ENCOUNTER — Other Ambulatory Visit (HOSPITAL_COMMUNITY): Payer: Self-pay

## 2023-12-22 MED ORDER — MOUNJARO 5 MG/0.5ML ~~LOC~~ SOAJ
5.0000 mg | SUBCUTANEOUS | 2 refills | Status: AC
Start: 2023-12-22 — End: ?
  Filled 2023-12-22 – 2024-01-04 (×3): qty 2, 28d supply, fill #0

## 2023-12-24 ENCOUNTER — Other Ambulatory Visit (HOSPITAL_COMMUNITY): Payer: Self-pay

## 2024-01-01 NOTE — Addendum Note (Signed)
 Addended by: Edra Govern D on: 01/01/2024 12:48 PM   Modules accepted: Orders

## 2024-01-01 NOTE — Progress Notes (Signed)
 [facility] Summary Report / Loop Recorder

## 2024-01-04 ENCOUNTER — Other Ambulatory Visit (HOSPITAL_COMMUNITY): Payer: Self-pay

## 2024-01-17 ENCOUNTER — Ambulatory Visit (INDEPENDENT_AMBULATORY_CARE_PROVIDER_SITE_OTHER): Payer: Self-pay

## 2024-01-17 DIAGNOSIS — I6300 Cerebral infarction due to thrombosis of unspecified precerebral artery: Secondary | ICD-10-CM

## 2024-01-17 LAB — CUP PACEART REMOTE DEVICE CHECK
Date Time Interrogation Session: 20250611230833
Implantable Pulse Generator Implant Date: 20220401

## 2024-01-22 ENCOUNTER — Ambulatory Visit: Payer: Self-pay | Admitting: Cardiology

## 2024-01-29 ENCOUNTER — Other Ambulatory Visit: Payer: Self-pay | Admitting: Internal Medicine

## 2024-01-29 DIAGNOSIS — Z1231 Encounter for screening mammogram for malignant neoplasm of breast: Secondary | ICD-10-CM

## 2024-02-04 ENCOUNTER — Other Ambulatory Visit (HOSPITAL_COMMUNITY): Payer: Self-pay

## 2024-02-04 NOTE — Progress Notes (Signed)
 [facility] Summary Report / Loop Recorder

## 2024-02-04 NOTE — Addendum Note (Signed)
 Addended by: TAWNI DRILLING D on: 02/04/2024 05:34 PM   Modules accepted: Orders

## 2024-02-05 ENCOUNTER — Other Ambulatory Visit (HOSPITAL_COMMUNITY): Payer: Self-pay

## 2024-02-05 MED ORDER — TRESIBA FLEXTOUCH 200 UNIT/ML ~~LOC~~ SOPN
50.0000 [IU] | PEN_INJECTOR | Freq: Every day | SUBCUTANEOUS | 5 refills | Status: AC
  Filled 2024-02-05: qty 9, 36d supply, fill #0
  Filled 2024-04-02: qty 9, 36d supply, fill #1
  Filled 2024-05-20: qty 9, 36d supply, fill #0
  Filled 2024-05-20 (×2): qty 9, 36d supply, fill #2
  Filled 2024-05-20: qty 9, 36d supply, fill #0
  Filled 2024-07-17 – 2024-07-18 (×2): qty 9, 36d supply, fill #1

## 2024-02-05 MED ORDER — MOUNJARO 7.5 MG/0.5ML ~~LOC~~ SOAJ
7.5000 mg | SUBCUTANEOUS | 2 refills | Status: DC
  Filled 2024-02-05: qty 2, 28d supply, fill #0
  Filled 2024-03-07: qty 2, 28d supply, fill #1
  Filled 2024-04-02: qty 2, 28d supply, fill #2

## 2024-02-11 ENCOUNTER — Other Ambulatory Visit (HOSPITAL_COMMUNITY): Payer: Self-pay

## 2024-02-14 ENCOUNTER — Other Ambulatory Visit (HOSPITAL_COMMUNITY): Payer: Self-pay

## 2024-02-18 ENCOUNTER — Ambulatory Visit (INDEPENDENT_AMBULATORY_CARE_PROVIDER_SITE_OTHER): Payer: Self-pay

## 2024-02-18 DIAGNOSIS — I6300 Cerebral infarction due to thrombosis of unspecified precerebral artery: Secondary | ICD-10-CM

## 2024-02-18 LAB — CUP PACEART REMOTE DEVICE CHECK
Date Time Interrogation Session: 20250713233038
Implantable Pulse Generator Implant Date: 20220401

## 2024-02-19 ENCOUNTER — Ambulatory Visit: Payer: Self-pay | Admitting: Cardiology

## 2024-02-19 ENCOUNTER — Other Ambulatory Visit (HOSPITAL_COMMUNITY): Payer: Self-pay

## 2024-02-19 MED ORDER — CLOPIDOGREL BISULFATE 75 MG PO TABS
75.0000 mg | ORAL_TABLET | Freq: Every day | ORAL | 3 refills | Status: AC
  Filled 2024-02-19 – 2024-06-16 (×2): qty 90, 90d supply, fill #0

## 2024-03-04 ENCOUNTER — Ambulatory Visit
Admission: RE | Admit: 2024-03-04 | Discharge: 2024-03-04 | Disposition: A | Discharge status: Home / Self Care | Source: Ambulatory Visit | Attending: Internal Medicine | Admitting: Internal Medicine

## 2024-03-04 ENCOUNTER — Encounter: Payer: Self-pay | Admitting: Radiology

## 2024-03-04 DIAGNOSIS — Z1231 Encounter for screening mammogram for malignant neoplasm of breast: Secondary | ICD-10-CM

## 2024-03-07 NOTE — Progress Notes (Signed)
 [facility] Summary Report / Loop Recorder

## 2024-03-14 ENCOUNTER — Other Ambulatory Visit (HOSPITAL_COMMUNITY): Payer: Self-pay

## 2024-03-14 MED ORDER — FLUCONAZOLE 150 MG PO TABS
150.0000 mg | ORAL_TABLET | Freq: Every day | ORAL | 1 refills | Status: DC
  Filled 2024-03-14: qty 1, 1d supply, fill #0

## 2024-03-14 MED ORDER — CARVEDILOL 12.5 MG PO TABS
12.5000 mg | ORAL_TABLET | Freq: Two times a day (BID) | ORAL | 3 refills | Status: AC
  Filled 2024-03-14 (×2): qty 180, 90d supply, fill #0

## 2024-03-20 ENCOUNTER — Ambulatory Visit (INDEPENDENT_AMBULATORY_CARE_PROVIDER_SITE_OTHER): Payer: Self-pay

## 2024-03-20 DIAGNOSIS — I6300 Cerebral infarction due to thrombosis of unspecified precerebral artery: Secondary | ICD-10-CM

## 2024-03-20 LAB — CUP PACEART REMOTE DEVICE CHECK
Date Time Interrogation Session: 20250813231515
Implantable Pulse Generator Implant Date: 20220401

## 2024-03-22 ENCOUNTER — Ambulatory Visit: Payer: Self-pay | Admitting: Cardiology

## 2024-04-02 ENCOUNTER — Other Ambulatory Visit (HOSPITAL_COMMUNITY): Payer: Self-pay

## 2024-04-02 ENCOUNTER — Other Ambulatory Visit: Payer: Self-pay

## 2024-04-08 ENCOUNTER — Other Ambulatory Visit (HOSPITAL_COMMUNITY): Payer: Self-pay

## 2024-04-09 ENCOUNTER — Other Ambulatory Visit (HOSPITAL_COMMUNITY): Payer: Self-pay

## 2024-04-09 MED ORDER — DONEPEZIL HCL 10 MG PO TABS
10.0000 mg | ORAL_TABLET | Freq: Every day | ORAL | 3 refills | Status: AC
  Filled 2024-04-09: qty 90, 90d supply, fill #0
  Filled 2024-07-21: qty 90, 90d supply, fill #1

## 2024-04-21 ENCOUNTER — Ambulatory Visit (INDEPENDENT_AMBULATORY_CARE_PROVIDER_SITE_OTHER): Payer: Self-pay

## 2024-04-21 DIAGNOSIS — I6300 Cerebral infarction due to thrombosis of unspecified precerebral artery: Secondary | ICD-10-CM

## 2024-04-21 LAB — CUP PACEART REMOTE DEVICE CHECK
Date Time Interrogation Session: 20250913231028
Implantable Pulse Generator Implant Date: 20220401

## 2024-04-22 ENCOUNTER — Ambulatory Visit: Payer: Self-pay | Admitting: Cardiology

## 2024-04-26 NOTE — Progress Notes (Signed)
 Remote Loop Recorder Transmission

## 2024-04-30 ENCOUNTER — Other Ambulatory Visit (HOSPITAL_COMMUNITY): Payer: Self-pay

## 2024-04-30 NOTE — Progress Notes (Signed)
 Remote Loop Recorder Transmission

## 2024-05-01 ENCOUNTER — Other Ambulatory Visit (HOSPITAL_COMMUNITY): Payer: Self-pay

## 2024-05-01 MED ORDER — MOUNJARO 7.5 MG/0.5ML ~~LOC~~ SOAJ
7.5000 mg | SUBCUTANEOUS | 5 refills | Status: DC
  Filled 2024-05-01: qty 2, 28d supply, fill #0

## 2024-05-02 ENCOUNTER — Other Ambulatory Visit (HOSPITAL_COMMUNITY): Payer: Self-pay

## 2024-05-02 MED ORDER — MOUNJARO 7.5 MG/0.5ML ~~LOC~~ SOAJ
7.5000 mg | SUBCUTANEOUS | 2 refills | Status: AC
  Filled 2024-05-02 – 2024-05-28 (×3): qty 2, 28d supply, fill #0
  Filled 2024-06-24 (×2): qty 2, 28d supply, fill #1
  Filled 2024-08-04 – 2024-08-05 (×2): qty 2, 28d supply, fill #2

## 2024-05-02 MED ORDER — MOUNJARO 7.5 MG/0.5ML ~~LOC~~ SOAJ
7.5000 mg | SUBCUTANEOUS | 2 refills | Status: AC
  Filled 2024-05-02: qty 2, 28d supply, fill #0

## 2024-05-09 ENCOUNTER — Other Ambulatory Visit (HOSPITAL_COMMUNITY): Payer: Self-pay

## 2024-05-09 MED ORDER — FLUCONAZOLE 100 MG PO TABS
100.0000 mg | ORAL_TABLET | Freq: Two times a day (BID) | ORAL | 1 refills | Status: AC
  Filled 2024-05-09: qty 14, 7d supply, fill #0

## 2024-05-15 NOTE — Progress Notes (Signed)
 Remote Loop Recorder Transmission

## 2024-05-20 ENCOUNTER — Ambulatory Visit: Payer: Self-pay

## 2024-05-20 ENCOUNTER — Other Ambulatory Visit (HOSPITAL_COMMUNITY): Payer: Self-pay

## 2024-05-20 DIAGNOSIS — I255 Ischemic cardiomyopathy: Secondary | ICD-10-CM | POA: Diagnosis not present

## 2024-05-20 LAB — CUP PACEART REMOTE DEVICE CHECK
Date Time Interrogation Session: 20251013230718
Implantable Pulse Generator Implant Date: 20220401

## 2024-05-21 ENCOUNTER — Other Ambulatory Visit: Payer: Self-pay

## 2024-05-21 ENCOUNTER — Other Ambulatory Visit (HOSPITAL_COMMUNITY): Payer: Self-pay

## 2024-05-21 NOTE — Progress Notes (Signed)
 Remote Loop Recorder Transmission

## 2024-05-28 ENCOUNTER — Other Ambulatory Visit: Payer: Self-pay

## 2024-06-03 ENCOUNTER — Ambulatory Visit: Payer: Self-pay | Admitting: Cardiology

## 2024-06-16 ENCOUNTER — Other Ambulatory Visit (HOSPITAL_COMMUNITY): Payer: Self-pay

## 2024-06-16 ENCOUNTER — Other Ambulatory Visit: Payer: Self-pay

## 2024-06-20 ENCOUNTER — Ambulatory Visit (INDEPENDENT_AMBULATORY_CARE_PROVIDER_SITE_OTHER): Payer: Self-pay

## 2024-06-20 DIAGNOSIS — I255 Ischemic cardiomyopathy: Secondary | ICD-10-CM

## 2024-06-22 LAB — CUP PACEART REMOTE DEVICE CHECK
Date Time Interrogation Session: 20251113230715
Implantable Pulse Generator Implant Date: 20220401

## 2024-06-24 ENCOUNTER — Other Ambulatory Visit (HOSPITAL_COMMUNITY): Payer: Self-pay

## 2024-06-24 NOTE — Progress Notes (Signed)
 Remote Loop Recorder Transmission

## 2024-06-25 ENCOUNTER — Ambulatory Visit: Payer: Self-pay | Admitting: Cardiology

## 2024-07-18 ENCOUNTER — Other Ambulatory Visit: Payer: Self-pay

## 2024-07-18 ENCOUNTER — Other Ambulatory Visit (HOSPITAL_COMMUNITY): Payer: Self-pay

## 2024-07-21 ENCOUNTER — Other Ambulatory Visit (HOSPITAL_COMMUNITY): Payer: Self-pay

## 2024-07-21 ENCOUNTER — Ambulatory Visit: Payer: Self-pay

## 2024-07-21 DIAGNOSIS — I255 Ischemic cardiomyopathy: Secondary | ICD-10-CM

## 2024-07-22 LAB — CUP PACEART REMOTE DEVICE CHECK
Date Time Interrogation Session: 20251214231113
Implantable Pulse Generator Implant Date: 20220401

## 2024-07-25 NOTE — Progress Notes (Signed)
 Remote Loop Recorder Transmission

## 2024-08-04 ENCOUNTER — Ambulatory Visit: Payer: Self-pay | Admitting: Cardiology

## 2024-08-04 ENCOUNTER — Other Ambulatory Visit (HOSPITAL_COMMUNITY): Payer: Self-pay

## 2024-08-05 ENCOUNTER — Other Ambulatory Visit (HOSPITAL_COMMUNITY): Payer: Self-pay

## 2024-08-08 ENCOUNTER — Other Ambulatory Visit (HOSPITAL_COMMUNITY): Payer: Self-pay

## 2024-08-08 ENCOUNTER — Other Ambulatory Visit: Payer: Self-pay

## 2024-08-12 ENCOUNTER — Other Ambulatory Visit (HOSPITAL_COMMUNITY): Payer: Self-pay

## 2024-08-21 ENCOUNTER — Ambulatory Visit: Payer: Self-pay

## 2024-08-21 DIAGNOSIS — I255 Ischemic cardiomyopathy: Secondary | ICD-10-CM

## 2024-08-21 LAB — CUP PACEART REMOTE DEVICE CHECK
Date Time Interrogation Session: 20260114230826
Implantable Pulse Generator Implant Date: 20220401

## 2024-08-22 ENCOUNTER — Ambulatory Visit: Payer: Self-pay | Admitting: Cardiology

## 2024-08-29 NOTE — Progress Notes (Signed)
 Remote Loop Recorder Transmission

## 2024-09-10 ENCOUNTER — Other Ambulatory Visit (HOSPITAL_COMMUNITY): Payer: Self-pay

## 2024-09-10 MED ORDER — MOUNJARO 10 MG/0.5ML ~~LOC~~ SOAJ
10.0000 mg | SUBCUTANEOUS | 3 refills | Status: AC
  Filled 2024-09-10: qty 2, 28d supply, fill #0

## 2024-09-10 MED ORDER — TRESIBA FLEXTOUCH 200 UNIT/ML ~~LOC~~ SOPN
50.0000 [IU] | PEN_INJECTOR | Freq: Every day | SUBCUTANEOUS | 5 refills | Status: AC
  Filled 2024-09-10: qty 9, 36d supply, fill #0
